# Patient Record
Sex: Female | Born: 1935 | Race: White | Hispanic: No | Smoking: Never smoker
Health system: Southern US, Community
[De-identification: ages and names within clinical notes are randomized; demographics above are authoritative.]

## PROBLEM LIST (undated history)

## (undated) DIAGNOSIS — E785 Hyperlipidemia, unspecified: Secondary | ICD-10-CM

## (undated) DIAGNOSIS — M1990 Unspecified osteoarthritis, unspecified site: Secondary | ICD-10-CM

## (undated) DIAGNOSIS — K5790 Diverticulosis of intestine, part unspecified, without perforation or abscess without bleeding: Secondary | ICD-10-CM

## (undated) DIAGNOSIS — I739 Peripheral vascular disease, unspecified: Secondary | ICD-10-CM

## (undated) DIAGNOSIS — R011 Cardiac murmur, unspecified: Secondary | ICD-10-CM

## (undated) DIAGNOSIS — K449 Diaphragmatic hernia without obstruction or gangrene: Secondary | ICD-10-CM

## (undated) DIAGNOSIS — J069 Acute upper respiratory infection, unspecified: Secondary | ICD-10-CM

## (undated) DIAGNOSIS — I499 Cardiac arrhythmia, unspecified: Secondary | ICD-10-CM

## (undated) DIAGNOSIS — K589 Irritable bowel syndrome without diarrhea: Secondary | ICD-10-CM

## (undated) DIAGNOSIS — F419 Anxiety disorder, unspecified: Secondary | ICD-10-CM

## (undated) DIAGNOSIS — J329 Chronic sinusitis, unspecified: Secondary | ICD-10-CM

## (undated) DIAGNOSIS — J189 Pneumonia, unspecified organism: Secondary | ICD-10-CM

## (undated) DIAGNOSIS — J309 Allergic rhinitis, unspecified: Secondary | ICD-10-CM

## (undated) DIAGNOSIS — K219 Gastro-esophageal reflux disease without esophagitis: Secondary | ICD-10-CM

## (undated) DIAGNOSIS — I10 Essential (primary) hypertension: Secondary | ICD-10-CM

## (undated) HISTORY — DX: Diaphragmatic hernia without obstruction or gangrene: K44.9

## (undated) HISTORY — DX: Diverticulosis of intestine, part unspecified, without perforation or abscess without bleeding: K57.90

## (undated) HISTORY — DX: Irritable bowel syndrome, unspecified: K58.9

## (undated) HISTORY — DX: Anxiety disorder, unspecified: F41.9

## (undated) HISTORY — DX: Peripheral vascular disease, unspecified: I73.9

## (undated) HISTORY — DX: Chronic sinusitis, unspecified: J32.9

## (undated) HISTORY — PX: APPENDECTOMY: SHX54

## (undated) HISTORY — DX: Allergic rhinitis, unspecified: J30.9

## (undated) HISTORY — PX: OOPHORECTOMY: SHX86

## (undated) HISTORY — DX: Cardiac arrhythmia, unspecified: I49.9

## (undated) HISTORY — DX: Hyperlipidemia, unspecified: E78.5

## (undated) HISTORY — DX: Cardiac murmur, unspecified: R01.1

## (undated) HISTORY — DX: Gastro-esophageal reflux disease without esophagitis: K21.9

## (undated) HISTORY — DX: Essential (primary) hypertension: I10

## (undated) HISTORY — DX: Unspecified osteoarthritis, unspecified site: M19.90

## (undated) HISTORY — PX: KNEE SURGERY: SHX244

## (undated) HISTORY — DX: Acute upper respiratory infection, unspecified: J06.9

## (undated) HISTORY — PX: CHOLECYSTECTOMY: SHX55

## (undated) HISTORY — DX: Pneumonia, unspecified organism: J18.9

---

## 1999-02-27 ENCOUNTER — Other Ambulatory Visit: Admission: RE | Admit: 1999-02-27 | Discharge: 1999-02-27 | Payer: Self-pay | Admitting: Internal Medicine

## 2000-05-17 ENCOUNTER — Ambulatory Visit (HOSPITAL_COMMUNITY): Admission: RE | Admit: 2000-05-17 | Discharge: 2000-05-17 | Payer: Self-pay | Admitting: Orthopedic Surgery

## 2001-03-30 ENCOUNTER — Other Ambulatory Visit: Admission: RE | Admit: 2001-03-30 | Discharge: 2001-03-30 | Payer: Self-pay | Admitting: Internal Medicine

## 2001-04-11 ENCOUNTER — Encounter: Payer: Self-pay | Admitting: Internal Medicine

## 2001-04-11 ENCOUNTER — Encounter: Admission: RE | Admit: 2001-04-11 | Discharge: 2001-04-11 | Payer: Self-pay | Admitting: Internal Medicine

## 2001-06-15 ENCOUNTER — Encounter: Admission: RE | Admit: 2001-06-15 | Discharge: 2001-06-15 | Payer: Self-pay | Admitting: Obstetrics and Gynecology

## 2001-06-15 ENCOUNTER — Encounter: Payer: Self-pay | Admitting: Obstetrics and Gynecology

## 2001-07-13 ENCOUNTER — Encounter (INDEPENDENT_AMBULATORY_CARE_PROVIDER_SITE_OTHER): Payer: Self-pay

## 2001-07-13 ENCOUNTER — Ambulatory Visit (HOSPITAL_COMMUNITY): Admission: RE | Admit: 2001-07-13 | Discharge: 2001-07-13 | Payer: Self-pay | Admitting: Obstetrics and Gynecology

## 2002-01-02 ENCOUNTER — Encounter: Payer: Self-pay | Admitting: Emergency Medicine

## 2002-01-02 ENCOUNTER — Emergency Department (HOSPITAL_COMMUNITY): Admission: EM | Admit: 2002-01-02 | Discharge: 2002-01-02 | Payer: Self-pay | Admitting: Emergency Medicine

## 2002-04-05 ENCOUNTER — Observation Stay (HOSPITAL_COMMUNITY): Admission: RE | Admit: 2002-04-05 | Discharge: 2002-04-06 | Payer: Self-pay | Admitting: General Surgery

## 2002-04-05 ENCOUNTER — Encounter (INDEPENDENT_AMBULATORY_CARE_PROVIDER_SITE_OTHER): Payer: Self-pay | Admitting: Specialist

## 2004-09-24 ENCOUNTER — Ambulatory Visit: Payer: Self-pay | Admitting: Internal Medicine

## 2004-09-30 ENCOUNTER — Ambulatory Visit: Payer: Self-pay | Admitting: Internal Medicine

## 2004-10-07 ENCOUNTER — Ambulatory Visit: Payer: Self-pay | Admitting: Internal Medicine

## 2004-10-14 ENCOUNTER — Ambulatory Visit: Payer: Self-pay | Admitting: Internal Medicine

## 2004-10-24 ENCOUNTER — Ambulatory Visit: Payer: Self-pay | Admitting: Internal Medicine

## 2004-11-19 ENCOUNTER — Ambulatory Visit: Payer: Self-pay | Admitting: Internal Medicine

## 2004-11-27 ENCOUNTER — Ambulatory Visit: Payer: Self-pay | Admitting: Internal Medicine

## 2004-12-01 ENCOUNTER — Ambulatory Visit: Payer: Self-pay | Admitting: Internal Medicine

## 2004-12-11 ENCOUNTER — Ambulatory Visit: Payer: Self-pay | Admitting: Internal Medicine

## 2004-12-17 ENCOUNTER — Ambulatory Visit: Payer: Self-pay | Admitting: Internal Medicine

## 2004-12-25 ENCOUNTER — Ambulatory Visit: Payer: Self-pay | Admitting: Internal Medicine

## 2005-01-01 ENCOUNTER — Ambulatory Visit: Payer: Self-pay | Admitting: Internal Medicine

## 2005-01-08 ENCOUNTER — Ambulatory Visit: Payer: Self-pay | Admitting: Internal Medicine

## 2005-01-15 ENCOUNTER — Ambulatory Visit: Payer: Self-pay | Admitting: Internal Medicine

## 2005-01-22 ENCOUNTER — Ambulatory Visit: Payer: Self-pay | Admitting: Internal Medicine

## 2005-01-28 ENCOUNTER — Ambulatory Visit: Payer: Self-pay | Admitting: Internal Medicine

## 2005-02-05 ENCOUNTER — Ambulatory Visit: Payer: Self-pay | Admitting: Internal Medicine

## 2005-02-11 ENCOUNTER — Ambulatory Visit: Payer: Self-pay | Admitting: Internal Medicine

## 2005-02-19 ENCOUNTER — Ambulatory Visit: Payer: Self-pay | Admitting: Internal Medicine

## 2005-02-26 ENCOUNTER — Ambulatory Visit: Payer: Self-pay | Admitting: Internal Medicine

## 2005-03-05 ENCOUNTER — Ambulatory Visit: Payer: Self-pay | Admitting: Internal Medicine

## 2005-03-06 ENCOUNTER — Other Ambulatory Visit: Admission: RE | Admit: 2005-03-06 | Discharge: 2005-03-06 | Payer: Self-pay | Admitting: Obstetrics and Gynecology

## 2005-03-11 ENCOUNTER — Ambulatory Visit: Payer: Self-pay | Admitting: Internal Medicine

## 2005-03-16 ENCOUNTER — Encounter: Admission: RE | Admit: 2005-03-16 | Discharge: 2005-03-16 | Payer: Self-pay | Admitting: Obstetrics and Gynecology

## 2005-03-19 ENCOUNTER — Ambulatory Visit: Payer: Self-pay | Admitting: Internal Medicine

## 2005-03-25 ENCOUNTER — Ambulatory Visit: Payer: Self-pay | Admitting: Internal Medicine

## 2005-04-02 ENCOUNTER — Ambulatory Visit: Payer: Self-pay | Admitting: Internal Medicine

## 2005-04-08 ENCOUNTER — Ambulatory Visit: Payer: Self-pay | Admitting: Internal Medicine

## 2005-04-22 ENCOUNTER — Ambulatory Visit: Payer: Self-pay | Admitting: Internal Medicine

## 2005-04-30 ENCOUNTER — Ambulatory Visit: Payer: Self-pay | Admitting: Internal Medicine

## 2005-05-06 ENCOUNTER — Ambulatory Visit: Payer: Self-pay | Admitting: Internal Medicine

## 2005-05-20 ENCOUNTER — Ambulatory Visit: Payer: Self-pay | Admitting: Internal Medicine

## 2005-05-27 ENCOUNTER — Ambulatory Visit: Payer: Self-pay | Admitting: Internal Medicine

## 2005-05-29 ENCOUNTER — Ambulatory Visit: Payer: Self-pay | Admitting: Internal Medicine

## 2005-06-08 ENCOUNTER — Ambulatory Visit: Payer: Self-pay | Admitting: Internal Medicine

## 2005-06-18 ENCOUNTER — Ambulatory Visit: Payer: Self-pay | Admitting: Internal Medicine

## 2005-06-25 ENCOUNTER — Ambulatory Visit: Payer: Self-pay | Admitting: Internal Medicine

## 2005-07-01 ENCOUNTER — Ambulatory Visit: Payer: Self-pay | Admitting: Internal Medicine

## 2005-07-09 ENCOUNTER — Ambulatory Visit: Payer: Self-pay | Admitting: Internal Medicine

## 2005-07-16 ENCOUNTER — Ambulatory Visit: Payer: Self-pay | Admitting: Internal Medicine

## 2005-07-22 ENCOUNTER — Ambulatory Visit: Payer: Self-pay | Admitting: Internal Medicine

## 2005-08-05 ENCOUNTER — Ambulatory Visit: Payer: Self-pay | Admitting: Internal Medicine

## 2005-08-20 ENCOUNTER — Ambulatory Visit: Payer: Self-pay | Admitting: Internal Medicine

## 2005-08-26 ENCOUNTER — Ambulatory Visit: Payer: Self-pay | Admitting: Internal Medicine

## 2005-09-04 ENCOUNTER — Ambulatory Visit: Payer: Self-pay | Admitting: Internal Medicine

## 2005-09-10 ENCOUNTER — Ambulatory Visit: Payer: Self-pay | Admitting: Internal Medicine

## 2005-09-17 ENCOUNTER — Ambulatory Visit: Payer: Self-pay | Admitting: Internal Medicine

## 2005-09-24 ENCOUNTER — Ambulatory Visit: Payer: Self-pay | Admitting: Internal Medicine

## 2005-10-09 ENCOUNTER — Ambulatory Visit: Payer: Self-pay | Admitting: Internal Medicine

## 2005-10-29 ENCOUNTER — Ambulatory Visit: Payer: Self-pay | Admitting: Internal Medicine

## 2005-11-11 ENCOUNTER — Ambulatory Visit: Payer: Self-pay | Admitting: Internal Medicine

## 2005-11-16 ENCOUNTER — Ambulatory Visit: Payer: Self-pay | Admitting: Internal Medicine

## 2005-11-18 ENCOUNTER — Ambulatory Visit: Payer: Self-pay | Admitting: Internal Medicine

## 2005-11-26 ENCOUNTER — Ambulatory Visit: Payer: Self-pay | Admitting: Internal Medicine

## 2005-12-02 ENCOUNTER — Ambulatory Visit: Payer: Self-pay | Admitting: Internal Medicine

## 2005-12-10 ENCOUNTER — Ambulatory Visit: Payer: Self-pay | Admitting: Internal Medicine

## 2005-12-16 ENCOUNTER — Ambulatory Visit: Payer: Self-pay | Admitting: Internal Medicine

## 2005-12-24 ENCOUNTER — Ambulatory Visit: Payer: Self-pay | Admitting: Internal Medicine

## 2005-12-31 ENCOUNTER — Ambulatory Visit: Payer: Self-pay | Admitting: Internal Medicine

## 2006-01-06 ENCOUNTER — Ambulatory Visit: Payer: Self-pay | Admitting: Internal Medicine

## 2006-01-21 ENCOUNTER — Ambulatory Visit: Payer: Self-pay | Admitting: Internal Medicine

## 2006-01-28 ENCOUNTER — Ambulatory Visit: Payer: Self-pay | Admitting: Internal Medicine

## 2006-02-04 ENCOUNTER — Ambulatory Visit: Payer: Self-pay | Admitting: Internal Medicine

## 2006-02-11 ENCOUNTER — Ambulatory Visit: Payer: Self-pay | Admitting: Internal Medicine

## 2006-02-18 ENCOUNTER — Ambulatory Visit: Payer: Self-pay | Admitting: Internal Medicine

## 2006-02-24 ENCOUNTER — Ambulatory Visit: Payer: Self-pay | Admitting: Internal Medicine

## 2006-03-04 ENCOUNTER — Ambulatory Visit: Payer: Self-pay | Admitting: Internal Medicine

## 2006-03-11 ENCOUNTER — Ambulatory Visit: Payer: Self-pay | Admitting: Internal Medicine

## 2006-03-12 ENCOUNTER — Ambulatory Visit: Payer: Self-pay | Admitting: Internal Medicine

## 2006-03-26 ENCOUNTER — Ambulatory Visit: Payer: Self-pay | Admitting: Internal Medicine

## 2006-04-01 ENCOUNTER — Ambulatory Visit: Payer: Self-pay | Admitting: Internal Medicine

## 2006-04-08 ENCOUNTER — Ambulatory Visit: Payer: Self-pay | Admitting: Internal Medicine

## 2006-04-13 ENCOUNTER — Encounter: Admission: RE | Admit: 2006-04-13 | Discharge: 2006-04-13 | Payer: Self-pay | Admitting: Obstetrics and Gynecology

## 2006-04-22 ENCOUNTER — Ambulatory Visit: Payer: Self-pay | Admitting: Internal Medicine

## 2006-05-11 ENCOUNTER — Ambulatory Visit: Payer: Self-pay | Admitting: Internal Medicine

## 2006-06-03 ENCOUNTER — Ambulatory Visit: Payer: Self-pay | Admitting: Internal Medicine

## 2006-06-11 ENCOUNTER — Ambulatory Visit: Payer: Self-pay | Admitting: Internal Medicine

## 2006-06-17 ENCOUNTER — Ambulatory Visit: Payer: Self-pay | Admitting: Internal Medicine

## 2006-06-24 ENCOUNTER — Ambulatory Visit: Payer: Self-pay | Admitting: Internal Medicine

## 2006-07-01 ENCOUNTER — Ambulatory Visit: Payer: Self-pay | Admitting: Internal Medicine

## 2006-07-08 ENCOUNTER — Ambulatory Visit: Payer: Self-pay | Admitting: Internal Medicine

## 2006-07-22 ENCOUNTER — Ambulatory Visit: Payer: Self-pay | Admitting: Internal Medicine

## 2006-07-29 ENCOUNTER — Ambulatory Visit: Payer: Self-pay | Admitting: Internal Medicine

## 2006-08-05 ENCOUNTER — Ambulatory Visit: Payer: Self-pay | Admitting: Internal Medicine

## 2006-08-19 ENCOUNTER — Ambulatory Visit: Payer: Self-pay | Admitting: Internal Medicine

## 2006-09-02 ENCOUNTER — Ambulatory Visit: Payer: Self-pay | Admitting: Internal Medicine

## 2006-09-09 ENCOUNTER — Ambulatory Visit: Payer: Self-pay | Admitting: Internal Medicine

## 2006-09-13 ENCOUNTER — Ambulatory Visit: Payer: Self-pay | Admitting: Internal Medicine

## 2006-09-16 ENCOUNTER — Ambulatory Visit: Payer: Self-pay | Admitting: Internal Medicine

## 2006-09-23 ENCOUNTER — Ambulatory Visit: Payer: Self-pay | Admitting: Internal Medicine

## 2006-10-08 ENCOUNTER — Ambulatory Visit: Payer: Self-pay | Admitting: Internal Medicine

## 2006-10-13 ENCOUNTER — Ambulatory Visit: Payer: Self-pay | Admitting: Internal Medicine

## 2006-10-21 ENCOUNTER — Ambulatory Visit: Payer: Self-pay | Admitting: Internal Medicine

## 2006-10-28 ENCOUNTER — Ambulatory Visit: Payer: Self-pay | Admitting: *Deleted

## 2006-11-01 ENCOUNTER — Ambulatory Visit: Payer: Self-pay | Admitting: Cardiology

## 2006-11-03 ENCOUNTER — Ambulatory Visit: Payer: Self-pay | Admitting: Internal Medicine

## 2006-11-09 ENCOUNTER — Ambulatory Visit: Payer: Self-pay

## 2006-11-09 ENCOUNTER — Encounter: Payer: Self-pay | Admitting: Cardiology

## 2006-11-10 ENCOUNTER — Ambulatory Visit: Payer: Self-pay | Admitting: Internal Medicine

## 2006-11-11 ENCOUNTER — Ambulatory Visit: Payer: Self-pay | Admitting: Cardiology

## 2006-11-18 ENCOUNTER — Ambulatory Visit: Payer: Self-pay | Admitting: Internal Medicine

## 2006-11-25 ENCOUNTER — Ambulatory Visit: Payer: Self-pay | Admitting: Internal Medicine

## 2006-12-02 ENCOUNTER — Ambulatory Visit: Payer: Self-pay | Admitting: Internal Medicine

## 2006-12-02 ENCOUNTER — Ambulatory Visit: Payer: Self-pay | Admitting: *Deleted

## 2006-12-08 ENCOUNTER — Ambulatory Visit: Payer: Self-pay | Admitting: Internal Medicine

## 2006-12-16 ENCOUNTER — Ambulatory Visit: Payer: Self-pay | Admitting: Internal Medicine

## 2006-12-22 ENCOUNTER — Ambulatory Visit: Payer: Self-pay | Admitting: Internal Medicine

## 2006-12-30 ENCOUNTER — Ambulatory Visit: Payer: Self-pay | Admitting: Internal Medicine

## 2007-01-06 ENCOUNTER — Ambulatory Visit: Payer: Self-pay | Admitting: Internal Medicine

## 2007-01-20 ENCOUNTER — Ambulatory Visit: Payer: Self-pay | Admitting: Internal Medicine

## 2007-01-27 ENCOUNTER — Ambulatory Visit: Payer: Self-pay | Admitting: Internal Medicine

## 2007-02-02 ENCOUNTER — Ambulatory Visit: Payer: Self-pay | Admitting: Internal Medicine

## 2007-02-24 ENCOUNTER — Ambulatory Visit: Payer: Self-pay | Admitting: Internal Medicine

## 2007-03-03 ENCOUNTER — Ambulatory Visit: Payer: Self-pay | Admitting: Internal Medicine

## 2007-03-10 ENCOUNTER — Ambulatory Visit: Payer: Self-pay | Admitting: Internal Medicine

## 2007-03-17 ENCOUNTER — Ambulatory Visit: Payer: Self-pay | Admitting: Internal Medicine

## 2007-03-24 ENCOUNTER — Ambulatory Visit: Payer: Self-pay | Admitting: Internal Medicine

## 2007-03-29 ENCOUNTER — Other Ambulatory Visit: Admission: RE | Admit: 2007-03-29 | Discharge: 2007-03-29 | Payer: Self-pay | Admitting: Obstetrics and Gynecology

## 2007-03-31 ENCOUNTER — Ambulatory Visit: Payer: Self-pay | Admitting: Internal Medicine

## 2007-04-21 ENCOUNTER — Ambulatory Visit: Payer: Self-pay | Admitting: Internal Medicine

## 2007-04-22 ENCOUNTER — Encounter: Admission: RE | Admit: 2007-04-22 | Discharge: 2007-04-22 | Payer: Self-pay | Admitting: Internal Medicine

## 2007-04-28 ENCOUNTER — Ambulatory Visit: Payer: Self-pay | Admitting: Internal Medicine

## 2007-05-05 ENCOUNTER — Ambulatory Visit: Payer: Self-pay | Admitting: Internal Medicine

## 2007-05-12 ENCOUNTER — Ambulatory Visit: Payer: Self-pay | Admitting: Internal Medicine

## 2007-05-20 ENCOUNTER — Ambulatory Visit: Payer: Self-pay | Admitting: Internal Medicine

## 2007-05-26 ENCOUNTER — Ambulatory Visit: Payer: Self-pay | Admitting: Internal Medicine

## 2007-06-02 ENCOUNTER — Ambulatory Visit: Payer: Self-pay | Admitting: Internal Medicine

## 2007-06-03 ENCOUNTER — Ambulatory Visit: Payer: Self-pay | Admitting: Internal Medicine

## 2007-06-09 ENCOUNTER — Ambulatory Visit: Payer: Self-pay | Admitting: Internal Medicine

## 2007-06-15 ENCOUNTER — Ambulatory Visit: Payer: Self-pay | Admitting: Internal Medicine

## 2007-06-23 ENCOUNTER — Ambulatory Visit: Payer: Self-pay | Admitting: Internal Medicine

## 2007-06-24 ENCOUNTER — Ambulatory Visit: Payer: Self-pay | Admitting: Internal Medicine

## 2007-06-29 ENCOUNTER — Ambulatory Visit: Payer: Self-pay | Admitting: Internal Medicine

## 2007-07-07 ENCOUNTER — Ambulatory Visit: Payer: Self-pay | Admitting: Internal Medicine

## 2007-07-14 ENCOUNTER — Ambulatory Visit: Payer: Self-pay | Admitting: Internal Medicine

## 2007-07-28 ENCOUNTER — Ambulatory Visit: Payer: Self-pay | Admitting: Internal Medicine

## 2007-08-11 ENCOUNTER — Ambulatory Visit: Payer: Self-pay | Admitting: Internal Medicine

## 2007-08-25 ENCOUNTER — Ambulatory Visit: Payer: Self-pay | Admitting: Internal Medicine

## 2007-09-02 ENCOUNTER — Ambulatory Visit: Payer: Self-pay | Admitting: Internal Medicine

## 2007-09-08 ENCOUNTER — Ambulatory Visit: Payer: Self-pay | Admitting: Internal Medicine

## 2007-09-16 ENCOUNTER — Ambulatory Visit: Payer: Self-pay | Admitting: Internal Medicine

## 2007-09-22 DIAGNOSIS — J189 Pneumonia, unspecified organism: Secondary | ICD-10-CM

## 2007-09-22 HISTORY — DX: Pneumonia, unspecified organism: J18.9

## 2007-09-30 ENCOUNTER — Ambulatory Visit: Payer: Self-pay | Admitting: Internal Medicine

## 2007-10-13 ENCOUNTER — Ambulatory Visit: Payer: Self-pay | Admitting: Internal Medicine

## 2007-10-27 ENCOUNTER — Ambulatory Visit: Payer: Self-pay | Admitting: Internal Medicine

## 2007-11-02 ENCOUNTER — Ambulatory Visit: Payer: Self-pay | Admitting: Internal Medicine

## 2007-11-10 ENCOUNTER — Ambulatory Visit: Payer: Self-pay | Admitting: Internal Medicine

## 2007-11-17 ENCOUNTER — Ambulatory Visit: Payer: Self-pay | Admitting: Internal Medicine

## 2007-11-22 ENCOUNTER — Ambulatory Visit: Payer: Self-pay | Admitting: Internal Medicine

## 2007-12-01 ENCOUNTER — Ambulatory Visit: Payer: Self-pay | Admitting: Internal Medicine

## 2007-12-08 ENCOUNTER — Ambulatory Visit: Payer: Self-pay | Admitting: Internal Medicine

## 2007-12-09 ENCOUNTER — Ambulatory Visit: Payer: Self-pay | Admitting: Internal Medicine

## 2007-12-15 ENCOUNTER — Ambulatory Visit: Payer: Self-pay | Admitting: Internal Medicine

## 2007-12-22 ENCOUNTER — Ambulatory Visit: Payer: Self-pay | Admitting: Internal Medicine

## 2007-12-29 ENCOUNTER — Ambulatory Visit: Payer: Self-pay | Admitting: Internal Medicine

## 2008-01-05 ENCOUNTER — Ambulatory Visit: Payer: Self-pay | Admitting: Internal Medicine

## 2008-01-11 ENCOUNTER — Ambulatory Visit: Payer: Self-pay | Admitting: Internal Medicine

## 2008-01-17 ENCOUNTER — Ambulatory Visit: Payer: Self-pay | Admitting: Internal Medicine

## 2008-01-17 DIAGNOSIS — J069 Acute upper respiratory infection, unspecified: Secondary | ICD-10-CM | POA: Insufficient documentation

## 2008-01-17 DIAGNOSIS — J309 Allergic rhinitis, unspecified: Secondary | ICD-10-CM | POA: Insufficient documentation

## 2008-01-19 ENCOUNTER — Ambulatory Visit: Payer: Self-pay | Admitting: Internal Medicine

## 2008-01-26 ENCOUNTER — Ambulatory Visit: Payer: Self-pay | Admitting: Internal Medicine

## 2008-02-02 ENCOUNTER — Ambulatory Visit: Payer: Self-pay | Admitting: Internal Medicine

## 2008-02-09 ENCOUNTER — Ambulatory Visit: Payer: Self-pay | Admitting: Internal Medicine

## 2008-02-16 ENCOUNTER — Ambulatory Visit: Payer: Self-pay | Admitting: Internal Medicine

## 2008-02-23 ENCOUNTER — Ambulatory Visit: Payer: Self-pay | Admitting: Internal Medicine

## 2008-03-01 ENCOUNTER — Ambulatory Visit: Payer: Self-pay | Admitting: Internal Medicine

## 2008-03-15 ENCOUNTER — Ambulatory Visit: Payer: Self-pay | Admitting: Internal Medicine

## 2008-03-22 ENCOUNTER — Ambulatory Visit: Payer: Self-pay | Admitting: Internal Medicine

## 2008-03-29 ENCOUNTER — Ambulatory Visit: Payer: Self-pay | Admitting: Internal Medicine

## 2008-04-12 ENCOUNTER — Ambulatory Visit: Payer: Self-pay | Admitting: Internal Medicine

## 2008-04-13 ENCOUNTER — Ambulatory Visit: Payer: Self-pay | Admitting: Internal Medicine

## 2008-04-19 ENCOUNTER — Ambulatory Visit: Payer: Self-pay | Admitting: Internal Medicine

## 2008-04-24 ENCOUNTER — Encounter: Admission: RE | Admit: 2008-04-24 | Discharge: 2008-04-24 | Payer: Self-pay | Admitting: Obstetrics and Gynecology

## 2008-05-02 ENCOUNTER — Ambulatory Visit: Payer: Self-pay | Admitting: Internal Medicine

## 2008-05-07 ENCOUNTER — Ambulatory Visit: Payer: Self-pay | Admitting: Internal Medicine

## 2008-05-09 ENCOUNTER — Ambulatory Visit (HOSPITAL_COMMUNITY): Admission: RE | Admit: 2008-05-09 | Discharge: 2008-05-09 | Payer: Self-pay | Admitting: Internal Medicine

## 2008-05-17 ENCOUNTER — Ambulatory Visit: Payer: Self-pay | Admitting: Internal Medicine

## 2008-05-24 ENCOUNTER — Ambulatory Visit: Payer: Self-pay | Admitting: Internal Medicine

## 2008-05-30 ENCOUNTER — Ambulatory Visit: Payer: Self-pay | Admitting: Internal Medicine

## 2008-06-06 ENCOUNTER — Ambulatory Visit: Payer: Self-pay | Admitting: Internal Medicine

## 2008-06-14 ENCOUNTER — Ambulatory Visit: Payer: Self-pay | Admitting: Internal Medicine

## 2008-06-21 ENCOUNTER — Ambulatory Visit: Payer: Self-pay | Admitting: Internal Medicine

## 2008-07-11 ENCOUNTER — Ambulatory Visit: Payer: Self-pay | Admitting: Internal Medicine

## 2008-07-19 ENCOUNTER — Ambulatory Visit: Payer: Self-pay | Admitting: Internal Medicine

## 2008-07-26 ENCOUNTER — Ambulatory Visit: Payer: Self-pay | Admitting: Internal Medicine

## 2008-08-14 ENCOUNTER — Ambulatory Visit: Payer: Self-pay | Admitting: Internal Medicine

## 2008-08-15 ENCOUNTER — Ambulatory Visit: Payer: Self-pay | Admitting: Cardiology

## 2008-08-23 ENCOUNTER — Ambulatory Visit: Payer: Self-pay | Admitting: Internal Medicine

## 2008-09-06 ENCOUNTER — Ambulatory Visit: Payer: Self-pay | Admitting: Internal Medicine

## 2008-09-12 ENCOUNTER — Ambulatory Visit: Payer: Self-pay | Admitting: Internal Medicine

## 2008-09-13 ENCOUNTER — Ambulatory Visit: Payer: Self-pay | Admitting: Internal Medicine

## 2008-09-27 ENCOUNTER — Ambulatory Visit: Payer: Self-pay | Admitting: Internal Medicine

## 2008-10-04 ENCOUNTER — Ambulatory Visit: Payer: Self-pay | Admitting: Internal Medicine

## 2008-10-11 ENCOUNTER — Ambulatory Visit: Payer: Self-pay | Admitting: Internal Medicine

## 2008-10-18 ENCOUNTER — Ambulatory Visit: Payer: Self-pay | Admitting: Internal Medicine

## 2008-11-01 ENCOUNTER — Ambulatory Visit: Payer: Self-pay | Admitting: Internal Medicine

## 2008-11-06 ENCOUNTER — Ambulatory Visit: Payer: Self-pay | Admitting: Internal Medicine

## 2008-11-06 ENCOUNTER — Encounter: Payer: Self-pay | Admitting: Internal Medicine

## 2008-11-06 DIAGNOSIS — I10 Essential (primary) hypertension: Secondary | ICD-10-CM | POA: Insufficient documentation

## 2008-11-06 DIAGNOSIS — E785 Hyperlipidemia, unspecified: Secondary | ICD-10-CM | POA: Insufficient documentation

## 2008-11-15 ENCOUNTER — Ambulatory Visit: Payer: Self-pay | Admitting: Internal Medicine

## 2008-12-12 ENCOUNTER — Ambulatory Visit: Payer: Self-pay | Admitting: Internal Medicine

## 2008-12-20 ENCOUNTER — Ambulatory Visit: Payer: Self-pay | Admitting: Internal Medicine

## 2009-01-04 ENCOUNTER — Ambulatory Visit: Payer: Self-pay | Admitting: Internal Medicine

## 2009-01-11 ENCOUNTER — Ambulatory Visit: Payer: Self-pay | Admitting: Internal Medicine

## 2009-01-24 ENCOUNTER — Ambulatory Visit: Payer: Self-pay | Admitting: Internal Medicine

## 2009-01-30 ENCOUNTER — Ambulatory Visit: Payer: Self-pay | Admitting: Internal Medicine

## 2009-02-14 ENCOUNTER — Ambulatory Visit: Payer: Self-pay | Admitting: Internal Medicine

## 2009-02-21 ENCOUNTER — Ambulatory Visit: Payer: Self-pay | Admitting: Internal Medicine

## 2009-02-22 ENCOUNTER — Ambulatory Visit: Payer: Self-pay | Admitting: Internal Medicine

## 2009-03-07 ENCOUNTER — Ambulatory Visit: Payer: Self-pay | Admitting: Internal Medicine

## 2009-03-14 ENCOUNTER — Ambulatory Visit: Payer: Self-pay | Admitting: Internal Medicine

## 2009-03-20 ENCOUNTER — Ambulatory Visit: Payer: Self-pay | Admitting: Internal Medicine

## 2009-03-28 ENCOUNTER — Ambulatory Visit: Payer: Self-pay | Admitting: Internal Medicine

## 2009-04-04 ENCOUNTER — Ambulatory Visit: Payer: Self-pay | Admitting: Internal Medicine

## 2009-04-11 ENCOUNTER — Ambulatory Visit: Payer: Self-pay | Admitting: Internal Medicine

## 2009-04-17 ENCOUNTER — Other Ambulatory Visit: Admission: RE | Admit: 2009-04-17 | Discharge: 2009-04-17 | Payer: Self-pay | Admitting: Obstetrics and Gynecology

## 2009-04-18 ENCOUNTER — Ambulatory Visit: Payer: Self-pay | Admitting: Internal Medicine

## 2009-04-19 ENCOUNTER — Ambulatory Visit: Payer: Self-pay | Admitting: Cardiology

## 2009-04-19 DIAGNOSIS — I4949 Other premature depolarization: Secondary | ICD-10-CM | POA: Insufficient documentation

## 2009-04-25 ENCOUNTER — Ambulatory Visit: Payer: Self-pay | Admitting: Internal Medicine

## 2009-04-30 ENCOUNTER — Encounter: Admission: RE | Admit: 2009-04-30 | Discharge: 2009-04-30 | Payer: Self-pay | Admitting: Internal Medicine

## 2009-05-06 ENCOUNTER — Encounter: Admission: RE | Admit: 2009-05-06 | Discharge: 2009-05-06 | Payer: Self-pay | Admitting: Internal Medicine

## 2009-05-09 ENCOUNTER — Ambulatory Visit: Payer: Self-pay | Admitting: Internal Medicine

## 2009-05-16 ENCOUNTER — Ambulatory Visit: Payer: Self-pay | Admitting: Internal Medicine

## 2009-05-29 ENCOUNTER — Ambulatory Visit: Payer: Self-pay | Admitting: Internal Medicine

## 2009-06-05 ENCOUNTER — Ambulatory Visit: Payer: Self-pay | Admitting: Internal Medicine

## 2009-06-13 ENCOUNTER — Ambulatory Visit: Payer: Self-pay | Admitting: Internal Medicine

## 2009-06-18 ENCOUNTER — Ambulatory Visit (HOSPITAL_COMMUNITY): Admission: RE | Admit: 2009-06-18 | Discharge: 2009-06-18 | Payer: Self-pay | Admitting: Internal Medicine

## 2009-06-27 ENCOUNTER — Ambulatory Visit: Payer: Self-pay | Admitting: Internal Medicine

## 2009-07-03 ENCOUNTER — Ambulatory Visit: Payer: Self-pay | Admitting: Internal Medicine

## 2009-07-11 ENCOUNTER — Ambulatory Visit: Payer: Self-pay | Admitting: Internal Medicine

## 2009-07-17 ENCOUNTER — Ambulatory Visit: Payer: Self-pay | Admitting: Internal Medicine

## 2009-07-31 ENCOUNTER — Ambulatory Visit: Payer: Self-pay | Admitting: Internal Medicine

## 2009-08-08 ENCOUNTER — Ambulatory Visit: Payer: Self-pay | Admitting: Internal Medicine

## 2009-08-29 ENCOUNTER — Ambulatory Visit: Payer: Self-pay | Admitting: Internal Medicine

## 2009-09-06 ENCOUNTER — Ambulatory Visit: Payer: Self-pay | Admitting: Internal Medicine

## 2009-09-18 ENCOUNTER — Ambulatory Visit: Payer: Self-pay | Admitting: Internal Medicine

## 2009-09-26 ENCOUNTER — Ambulatory Visit: Payer: Self-pay | Admitting: Internal Medicine

## 2009-10-03 ENCOUNTER — Ambulatory Visit: Payer: Self-pay | Admitting: Internal Medicine

## 2009-10-10 ENCOUNTER — Ambulatory Visit: Payer: Self-pay | Admitting: Internal Medicine

## 2009-10-31 ENCOUNTER — Ambulatory Visit: Payer: Self-pay | Admitting: Internal Medicine

## 2009-11-05 ENCOUNTER — Ambulatory Visit: Payer: Self-pay | Admitting: Internal Medicine

## 2009-11-14 ENCOUNTER — Ambulatory Visit: Payer: Self-pay | Admitting: Internal Medicine

## 2009-11-21 ENCOUNTER — Ambulatory Visit: Payer: Self-pay | Admitting: Internal Medicine

## 2009-12-04 ENCOUNTER — Ambulatory Visit: Payer: Self-pay | Admitting: Internal Medicine

## 2009-12-12 ENCOUNTER — Ambulatory Visit: Payer: Self-pay | Admitting: Internal Medicine

## 2009-12-14 ENCOUNTER — Emergency Department (HOSPITAL_COMMUNITY): Admission: EM | Admit: 2009-12-14 | Discharge: 2009-12-14 | Payer: Self-pay | Admitting: Emergency Medicine

## 2009-12-26 ENCOUNTER — Ambulatory Visit: Payer: Self-pay | Admitting: Internal Medicine

## 2010-01-01 ENCOUNTER — Ambulatory Visit: Payer: Self-pay | Admitting: Internal Medicine

## 2010-01-02 ENCOUNTER — Ambulatory Visit: Payer: Self-pay | Admitting: Internal Medicine

## 2010-01-08 ENCOUNTER — Ambulatory Visit: Payer: Self-pay | Admitting: Cardiology

## 2010-01-09 ENCOUNTER — Ambulatory Visit: Payer: Self-pay | Admitting: Internal Medicine

## 2010-01-16 ENCOUNTER — Ambulatory Visit: Payer: Self-pay | Admitting: Internal Medicine

## 2010-01-23 ENCOUNTER — Ambulatory Visit: Payer: Self-pay | Admitting: Internal Medicine

## 2010-01-29 ENCOUNTER — Ambulatory Visit: Payer: Self-pay | Admitting: Internal Medicine

## 2010-02-12 ENCOUNTER — Ambulatory Visit: Payer: Self-pay | Admitting: Internal Medicine

## 2010-02-20 ENCOUNTER — Ambulatory Visit: Payer: Self-pay | Admitting: Internal Medicine

## 2010-02-27 ENCOUNTER — Ambulatory Visit: Payer: Self-pay | Admitting: Internal Medicine

## 2010-03-06 ENCOUNTER — Ambulatory Visit: Payer: Self-pay | Admitting: Internal Medicine

## 2010-03-13 ENCOUNTER — Ambulatory Visit: Payer: Self-pay | Admitting: Internal Medicine

## 2010-03-19 ENCOUNTER — Ambulatory Visit: Payer: Self-pay | Admitting: Internal Medicine

## 2010-03-27 ENCOUNTER — Ambulatory Visit: Payer: Self-pay | Admitting: Internal Medicine

## 2010-04-03 ENCOUNTER — Ambulatory Visit: Payer: Self-pay | Admitting: Internal Medicine

## 2010-04-10 ENCOUNTER — Ambulatory Visit: Payer: Self-pay | Admitting: Internal Medicine

## 2010-04-17 ENCOUNTER — Ambulatory Visit: Payer: Self-pay | Admitting: Internal Medicine

## 2010-04-24 ENCOUNTER — Ambulatory Visit: Payer: Self-pay | Admitting: Internal Medicine

## 2010-05-01 ENCOUNTER — Ambulatory Visit: Payer: Self-pay | Admitting: Internal Medicine

## 2010-05-07 ENCOUNTER — Ambulatory Visit: Payer: Self-pay | Admitting: Internal Medicine

## 2010-05-13 ENCOUNTER — Encounter: Admission: RE | Admit: 2010-05-13 | Discharge: 2010-05-13 | Payer: Self-pay | Admitting: Obstetrics and Gynecology

## 2010-05-15 ENCOUNTER — Ambulatory Visit: Payer: Self-pay | Admitting: Internal Medicine

## 2010-05-22 ENCOUNTER — Ambulatory Visit: Payer: Self-pay | Admitting: Internal Medicine

## 2010-05-23 ENCOUNTER — Ambulatory Visit: Payer: Self-pay | Admitting: Internal Medicine

## 2010-05-28 ENCOUNTER — Ambulatory Visit: Payer: Self-pay | Admitting: Internal Medicine

## 2010-06-05 ENCOUNTER — Ambulatory Visit: Payer: Self-pay | Admitting: Internal Medicine

## 2010-06-12 ENCOUNTER — Ambulatory Visit: Payer: Self-pay | Admitting: Internal Medicine

## 2010-06-19 ENCOUNTER — Ambulatory Visit: Payer: Self-pay | Admitting: Internal Medicine

## 2010-06-26 ENCOUNTER — Ambulatory Visit: Payer: Self-pay | Admitting: Internal Medicine

## 2010-07-03 ENCOUNTER — Ambulatory Visit: Payer: Self-pay | Admitting: Internal Medicine

## 2010-07-10 ENCOUNTER — Ambulatory Visit: Payer: Self-pay | Admitting: Internal Medicine

## 2010-07-17 ENCOUNTER — Ambulatory Visit: Payer: Self-pay | Admitting: Internal Medicine

## 2010-07-24 ENCOUNTER — Ambulatory Visit: Payer: Self-pay | Admitting: Internal Medicine

## 2010-07-30 ENCOUNTER — Ambulatory Visit: Payer: Self-pay | Admitting: Internal Medicine

## 2010-08-28 ENCOUNTER — Ambulatory Visit: Payer: Self-pay | Admitting: Internal Medicine

## 2010-09-04 ENCOUNTER — Ambulatory Visit: Payer: Self-pay | Admitting: Internal Medicine

## 2010-09-16 ENCOUNTER — Ambulatory Visit: Payer: Self-pay | Admitting: Internal Medicine

## 2010-09-23 ENCOUNTER — Ambulatory Visit: Payer: Self-pay | Admitting: Internal Medicine

## 2010-10-04 ENCOUNTER — Ambulatory Visit: Payer: Self-pay | Admitting: Internal Medicine

## 2010-10-10 ENCOUNTER — Ambulatory Visit: Payer: Self-pay | Admitting: Internal Medicine

## 2010-10-13 ENCOUNTER — Encounter: Payer: Self-pay | Admitting: Internal Medicine

## 2010-10-19 ENCOUNTER — Ambulatory Visit: Payer: Self-pay | Admitting: Internal Medicine

## 2010-10-21 NOTE — Miscellaneous (Signed)
Summary: Injection Record / [facility]    Injection Record / [facility]    Imported By: Lennie Odor 05/23/2010 10:33:08  _____________________________________________________________________  External Attachment:    Type:   Image     Comment:   External Document

## 2010-10-21 NOTE — Miscellaneous (Signed)
Summary: Injection Record / [facility]    Injection Record /  Allergy    Imported By: Lennie Odor 02/11/2010 15:01:49  _____________________________________________________________________  External Attachment:    Type:   Image     Comment:   External Document

## 2010-10-21 NOTE — Assessment & Plan Note (Signed)
Summary: 1 year//mbw   Primary Provider/Referring Provider:  Guerry Bruin  CC:  follow up visit-alleriges "kicking in".  History of Present Illness:  05/07/09- 77 year old woman returning for follow-up of allergic rhinitis.  Reports bronchitis in April and pneumonia, right lung in May.  She is using a Neti pot for saline nasal rinses p.r.n. and she continues allergy vaccine at 1:10, getting injections here.  Today, she feels well.  Dry cough varies from day to day.  Denies wheeze, fever or chills, dyspnea, or chest pain, ankle edema.  11/06/08- Allergic rhinitis Continues allergy vaccine here without problems. Went to an urgent care for treatment of a sinus infection. Takes zyrtec most days for watery nose. Never wheeze or asthma problems.  November 05, 2009- Allergic rhinitis One year f/u. She notices her nose has begun to water in the last day or so. Never wheeze or asthma.  Following with Dr Daleen Squibb for PVCs. She continues to feel allergy vaccine helps. We talked about indications for retesting. Discussed flu season- has had flu vax.    Current Medications (verified): 1)  Allergy Vaccine 1:10 Gh .... As Directed 2)  Atenolol 50 Mg Tabs (Atenolol) .Marland Kitchen.. 1 Tab Two Times A Day 3)  Prilosec Otc 20 Mg  Tbec (Omeprazole Magnesium) .... Take 1 Tablet By Mouth Once A Day 4)  Adult Aspirin Ec Low Strength 81 Mg  Tbec (Aspirin) .... Once Daily 5)  Cilostazol 100 Mg  Tabs (Cilostazol) .... Take 1 Tablet By Mouth Once A Day 6)  Amitriptyline Hcl 25 Mg  Tabs (Amitriptyline Hcl) .... Once Daily 7)  Vitamin D 1000 Unit Tabs (Cholecalciferol) .Marland Kitchen.. 1 Tab Once Daily 8)  Citracal + D 250-200 Mg-Unit  Tabs (Calcium Citrate-Vitamin D) .... Once Daily 9)  Alprazolam 0.5 Mg  Tabs (Alprazolam) .... Once Daily 10)  Pravastatin Sodium 40 Mg  Tabs (Pravastatin Sodium) .Marland Kitchen.. 1 Tab At Bedtime 11)  Zyrtec Allergy 10 Mg  Tabs (Cetirizine Hcl) .Marland Kitchen.. 1 Daily As Needed For Allergy 12)  Chlorthalidone 25 Mg Tabs  (Chlorthalidone) .Marland Kitchen.. 1 Tab Once Daily 13)  Multivitamins   Tabs (Multiple Vitamin) .Marland Kitchen.. 1 Tab Once Daily 14)  Klor-Con 10 10 Meq Cr-Tabs (Potassium Chloride) .... Take 1 By Mouth Once Daily  Allergies (verified): No Known Drug Allergies  Past History:  Past Medical History: Last updated: 04/18/2009 HYPERTENSION (ICD-401.9) HYPERLIPIDEMIA (ICD-272.4) URI (ICD-465.9) ALLERGIC RHINITIS (ICD-477.9) Pneumonia 2009  Past Surgical History: Last updated: 05/07/2008 Knee- Right repair after fall Ophorectomy Appendectomy  Family History: Last updated: 05/07/2008 grandfather had asthma mother died at 49 from CHF father died at 19 from old age 4 siblings living, both healthy  Social History: Last updated: 11/06/2008 never smoked positive for second-hand smoke exposure exercises 3x a week no caffeine 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    Vital Signs:  Patient profile:   77 year old female Height:      59 inches Weight:      127.25 pounds BMI:     25.79 O2 Sat:      99 % on Room air Pulse rate:   43 / minute BP sitting:   116 / 60  (left arm) Cuff size:   regular  Vitals  Entered By: Reynaldo Minium CMA (November 05, 2009 2:51 PM)  O2 Flow:  Room air  Physical Exam  Additional Exam:  General: A/Ox3; pleasant and cooperative, NAD, well-appearing SKIN: no rash, lesions NODES: no lymphadenopathy HEENT: [place]/AT, EOM- WNL, Conjuctivae- clear, PERRLA, TM-WNL, Nose- clear, Throat- clear and wnl, Melampatti III NECK: Supple w/ fair ROM, JVD- none, normal carotid impulses w/o bruits Thyroid-  CHEST: Clear to P&A, no wheeze or  rales HEART: Occasional dropped beat, no m/g/r heard ABDOMEN: Soft and nl;  ZOX:WRUE, nl pulses, no edema  NEURO: Grossly intact to observation      Impression & Recommendations:  Problem # 1:  ALLERGIC RHINITIS (ICD-477.9)  We discussed supplementation of allergy vaccine with antihistamine as needed. Her updated medication list for this problem includes:    Zyrtec Allergy 10 Mg Tabs (Cetirizine hcl) .Marland Kitchen... 1 daily as needed for allergy  Medications Added to Medication List This Visit: 1)  Klor-con 10 10 Meq Cr-tabs (Potassium chloride) .... Take 1 by mouth once daily  Other Orders: Est. Patient Level III (45409)  Patient Instructions: 1)  Schedule return in one year, earlier if needed 2)  Consider trying loratadine/ claritin for comparison with your Zyrtec if an antihistamine is needed for nasal drip and sneeze.

## 2010-10-21 NOTE — Assessment & Plan Note (Signed)
Summary: rov/jss  Medications Added KLOR-CON M20 20 MEQ CR-TABS (POTASSIUM CHLORIDE CRYS CR) 1 once daily      Allergies Added: NKDA  Visit Type:  rov Primary Provider:  Guerry Bruin  CC:  fatigue..denies any other complaints today.  History of Present Illness: Victoria Joseph returns today for evaluation and management of her premature ventricular contractions.  She received developed sinusitis and went to a [facility]. She was told that her heart rate was low and she needed to go to the emergency room. She went to [facility] her EKG showed normal sinus rhythm with frequent unifocal PVCs with a heart rate of 86 beats per minute. Today, her EKG shows her to be in ventricular bigeminy with a heart rate of 81 beats per minute. Checking her radial pulse it was running in the 40s.  She brings in a log of blood pressure reads which are quite good and a goal. Her heart rate sometimes runs in the 40s which is when she's having a lot of PVCs as I reassured her today. She is having no presyncope or palpitations.  Her potassium was 3.5 otherwise all over that it was negative or normal. I reviewed those with her today. She is on 10 mEq of potassium a day.  Current Medications (verified): 1)  Allergy Vaccine 1:10 Gh .... As Directed 2)  Atenolol 50 Mg Tabs (Atenolol) .Marland Kitchen.. 1 Tab Two Times A Day 3)  Prilosec Otc 20 Mg  Tbec (Omeprazole Magnesium) .... Take 1 Tablet By Mouth Once A Day 4)  Adult Aspirin Ec Low Strength 81 Mg  Tbec (Aspirin) .... Once Daily 5)  Cilostazol 100 Mg  Tabs (Cilostazol) .... Take 1 Tablet By Mouth Once A Day 6)  Amitriptyline Hcl 25 Mg  Tabs (Amitriptyline Hcl) .... Once Daily 7)  Vitamin D 1000 Unit Tabs (Cholecalciferol) .Marland Kitchen.. 1 Tab Once Daily 8)  Citracal + D 250-200 Mg-Unit  Tabs (Calcium Citrate-Vitamin D) .... Once Daily 9)  Alprazolam 0.5 Mg  Tabs (Alprazolam) .... Once Daily 10)  Pravastatin Sodium 40 Mg  Tabs (Pravastatin Sodium) .Marland Kitchen.. 1 Tab At Bedtime 11)  Zyrtec  Allergy 10 Mg  Tabs (Cetirizine Hcl) .Marland Kitchen.. 1 Daily As Needed For Allergy 12)  Chlorthalidone 25 Mg Tabs (Chlorthalidone) .Marland Kitchen.. 1 Tab Once Daily 13)  Multivitamins   Tabs (Multiple Vitamin) .Marland Kitchen.. 1 Tab Once Daily 14)  Klor-Con 10 10 Meq Cr-Tabs (Potassium Chloride) .... Take 1 By Mouth Once Daily  Allergies (verified): No Known Drug Allergies  Past History:  Past Medical History: Last updated: 04/18/2009 HYPERTENSION (ICD-401.9) HYPERLIPIDEMIA (ICD-272.4) URI (ICD-465.9) ALLERGIC RHINITIS (ICD-477.9) Pneumonia 2009  Past Surgical History: Last updated: 05/07/2008 Knee- Right repair after fall Ophorectomy Appendectomy  Family History: Last updated: 05/07/2008 grandfather had asthma mother died at 35 from CHF father died at 79 from old age 66 siblings living, both healthy  Social History: Last updated: 11/06/2008 never smoked positive for second-hand smoke exposure exercises 3x a week no caffeine 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 Eyes:  other than wearing glasses is normal Neck:  Neck supple, no JVD. No masses, thyromegaly or abnormal cervical nodes. Chest Victoria Joseph:  no deformities or breast masses noted Lungs:  Clear bilaterally to auscultation and percussion. Heart:  normal PMI, irregular rate and rhythm, normal S1-S2 Abdomen:  Bowel sounds positive; abdomen soft and non-tender without masses, organomegaly, or hernias noted. No hepatosplenomegaly. Msk:  Back  normal, normal gait. Muscle strength and tone normal. Pulses:  pulses normal in all 4 extremities Extremities:  No clubbing or cyanosis. Neurologic:  Alert and oriented x 3. Skin:  Intact without lesions or rashes. Psych:  Normal affect.   EKG  Procedure date:  01/08/2010  Findings:      normal sinus rhythm, ventricular bigeminy, rate 81 beats per minute  Impression & Recommendations:  Problem # 1:  PREMATURE VENTRICULAR CONTRACTIONS (ICD-427.69) Assessment Deteriorated I have reviewed all the records from outside including emergency room. Her affective heart rate when she's having a lot of PVCs is in the mid 40s. She is totally asymptomatic. Advised her that when someone checks her heart rate to check it by listening over her chest to get an accurate treat. Her potassium was 3.5 so we'll increase her potassium to 20 mEq a day.I will not decrease her atenolol for concerned that she'll have more symptomatology. I've advised her to see her primary care physician who is quite good rather than going to urgent care or [facility]. This will save a lot of confusion in the future not to mention redundant care. Her updated medication list for this problem includes:    Atenolol 50 Mg Tabs (Atenolol) .Marland Kitchen... 1 tab two times a day    Adult Aspirin Ec Low Strength 81 Mg Tbec (Aspirin) ..... Once daily    Cilostazol 100 Mg Tabs (Cilostazol) .Marland Kitchen... Take 1 tablet by mouth once a day  Orders: EKG w/ Interpretation (93000)  Problem # 2:  HYPERTENSION (ICD-401.9) Assessment: Improved  Her updated medication list for this problem includes:    Atenolol 50 Mg Tabs (Atenolol) .Marland Kitchen... 1 tab two times a day    Adult Aspirin Ec Low Strength 81 Mg Tbec (Aspirin) ..... Once daily    Chlorthalidone 25 Mg Tabs (Chlorthalidone) .Marland Kitchen... 1 tab once daily  Problem # 3:  HYPERLIPIDEMIA (ICD-272.4)  Her updated medication list for this problem includes:    Pravastatin Sodium 40 Mg Tabs (Pravastatin sodium) .Marland Kitchen... 1 tab  at bedtime  Problem # 5:  PREMATURE VENTRICULAR CONTRACTIONS (ICD-427.69)  Her updated medication list for this problem includes:    Atenolol 50 Mg Tabs (Atenolol) .Marland Kitchen... 1 tab two times a day    Adult Aspirin Ec Low Strength 81 Mg Tbec (Aspirin) ..... Once daily    Cilostazol 100 Mg Tabs (Cilostazol) .Marland Kitchen... Take 1 tablet by mouth once a day  Orders: EKG w/ Interpretation (93000)  Her updated medication list for this problem includes:    Atenolol 50 Mg Tabs (Atenolol) .Marland Kitchen... 1 tab two times a day    Adult Aspirin Ec Low Strength 81 Mg Tbec (Aspirin) ..... Once daily    Cilostazol 100 Mg Tabs (Cilostazol) .Marland Kitchen... Take 1 tablet by mouth once a day  Patient Instructions: 1)  Your physician recommends that you schedule a follow-up appointment in: YEAR WITH DR Marylou Wages 2)  Your physician has recommended you make  the following change in your medication:  3)  INCREASE POTASSIUM TO 20 MEQ EVERY DAY Prescriptions: KLOR-CON M20 20 MEQ CR-TABS (POTASSIUM CHLORIDE CRYS CR) 1 once daily  #30 x 11   Entered by:   Scherrie Bateman, LPN   Authorized by:   Gaylord Shih, MD, [facility]   Signed by:   Scherrie Bateman, LPN on 16/06/9603   Method used:   Electronically to        [id number] (retail)       [address]       [place], [place]  [address]       Ph: [phone] or [phone]       Fax: [phone]   RxID:   [id number]

## 2010-10-21 NOTE — Miscellaneous (Signed)
Summary: Injection Record/[facility]  Injection Record/[facility]   Imported By: Sherian Rein 01/22/2010 14:37:04  _____________________________________________________________________  External Attachment:    Type:   Image     Comment:   External Document

## 2010-10-23 DIAGNOSIS — J301 Allergic rhinitis due to pollen: Secondary | ICD-10-CM

## 2010-10-23 NOTE — Miscellaneous (Signed)
Summary: Injection record  Injection record   Imported By: Lester [place] 10/03/2010 11:50:14  _____________________________________________________________________  External Attachment:    Type:   Image     Comment:   External Document

## 2010-10-24 DIAGNOSIS — J301 Allergic rhinitis due to pollen: Secondary | ICD-10-CM

## 2010-11-04 ENCOUNTER — Ambulatory Visit (INDEPENDENT_AMBULATORY_CARE_PROVIDER_SITE_OTHER): Payer: Medicare Other | Admitting: Internal Medicine

## 2010-11-04 ENCOUNTER — Ambulatory Visit (INDEPENDENT_AMBULATORY_CARE_PROVIDER_SITE_OTHER): Payer: Medicare Other

## 2010-11-04 ENCOUNTER — Encounter: Payer: Self-pay | Admitting: Internal Medicine

## 2010-11-04 DIAGNOSIS — J301 Allergic rhinitis due to pollen: Secondary | ICD-10-CM

## 2010-11-04 DIAGNOSIS — J309 Allergic rhinitis, unspecified: Secondary | ICD-10-CM

## 2010-11-12 NOTE — Assessment & Plan Note (Signed)
Summary: rov 1 yr/kp   Primary Provider/Referring Provider:  Guerry Bruin  CC:  Yearly follow up visit-allergies..  History of Present Illness: 11/06/08- Allergic rhinitis Continues allergy vaccine here without problems. Went to an urgent care for treatment of a sinus infection. Takes zyrtec most days for watery nose. Never wheeze or asthma problems.  November 05, 2009- Allergic rhinitis One year f/u. She notices her nose has begun to water in the last day or so. Never wheeze or asthma.  Following with Dr Daleen Squibb for PVCs. She continues to feel allergy vaccine helps. We talked about indications for retesting. Discussed flu season- has had flu vax.  November 04, 2010- Allergic rhinitis Nurse-CC: Yearly follow up visit-allergies. She says allergy vaccine continues to help her. Continues at 1:10 here without problem. Supplemental Zyretec taken at night, works well without sleepiness, if needed. She never has asthma.  We reviewed her flu and pnumovax hx- up to date. Minor cough occasionally, not bothersome and she attributes it to "'drainage".    Preventive Screening-Counseling & Management  Alcohol-Tobacco     Smoking Status: never  Current Medications (verified): 1)  Allergy Vaccine 1:10 Gh .... As Directed 2)  Atenolol 50 Mg Tabs (Atenolol) .Marland Kitchen.. 1 Tab Two Times A Day 3)  Prilosec Otc 20 Mg  Tbec (Omeprazole Magnesium) .... Take 1 Tablet By Mouth Once A Day 4)  Adult Aspirin Ec Low Strength 81 Mg  Tbec (Aspirin) .... Once Daily 5)  Cilostazol 100 Mg  Tabs (Cilostazol) .... Take 1 Tablet By Mouth Once A Day 6)  Amitriptyline Hcl 25 Mg  Tabs (Amitriptyline Hcl) .... Once Daily 7)  Vitamin D 1000 Unit Tabs (Cholecalciferol) .Marland Kitchen.. 1 Tab Once Daily 8)  Citracal + D 250-200 Mg-Unit  Tabs (Calcium Citrate-Vitamin D) .... Once Daily 9)  Alprazolam 0.5 Mg  Tabs (Alprazolam) .... Once Daily 10)  Pravastatin Sodium 40 Mg  Tabs (Pravastatin Sodium) .Marland Kitchen.. 1 Tab At Bedtime 11)  Zyrtec Allergy  10 Mg  Tabs (Cetirizine Hcl) .Marland Kitchen.. 1 Daily As Needed For Allergy 12)  Chlorthalidone 25 Mg Tabs (Chlorthalidone) .Marland Kitchen.. 1 Tab Once Daily 13)  Multivitamins   Tabs (Multiple Vitamin) .Marland Kitchen.. 1 Tab Once Daily 14)  Klor-Con M20 20 Meq Cr-Tabs (Potassium Chloride Crys Cr) .Marland Kitchen.. 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  The patient denies shortness of breath with activity, shortness of breath at rest, productive cough, non-productive cough, coughing up blood, chest pain, acid heartburn, indigestion, loss of appetite, weight change, abdominal pain, difficulty swallowing, sore throat, tooth/dental problems, headaches, nasal congestion/difficulty breathing through nose, sneezing, itching, rash, change in color of mucus, and fever.    Vital Signs:  Patient profile:   77 year old female Height:      59 inches Weight:      126.13 pounds  BMI:     25.57 O2 Sat:      96 % on Room air Pulse rate:   44 / minute BP sitting:   118 / 70  (left arm) Cuff size:   regular  Vitals Entered By: Reynaldo Minium CMA (November 04, 2010 11:25 AM)  O2 Flow:  Room air  Physical Exam  Additional Exam:  General: A/Ox3; pleasant and cooperative, NAD,  well-appearing SKIN: no rash, lesions NODES: no lymphadenopathy HEENT: [place]/AT, EOM- WNL, Conjuctivae- clear, PERRLA, TM-WNL, Nose- clear, Throat- clear and wnl, Mallampati III NECK: Supple w/ fair ROM, JVD- none, normal carotid impulses w/o bruits Thyroid-  CHEST: Clear to P&A, no wheeze or rales HEART: slightly irregular by me at 68. Nurse recorded 40 on arrival, no m/g/r heard ABDOMEN: Soft and nl;  ZOX:WRUE, nl pulses, no edema  NEURO: Grossly intact to observation      Impression & Recommendations:  Problem # 1:  ALLERGIC RHINITIS (ICD-477.9)  She is convinced that her allergy shots are worth continuing, and needs only occasional otc antihistamine supplementation. We discussed the unusually early pollen levels being recorded this year. No change required.  Her updated medication list for this problem includes:    Zyrtec Allergy 10 Mg Tabs (Cetirizine hcl) .Marland Kitchen... 1 daily as needed for allergy  Other Orders: Est. Patient Level III (45409)  Patient Instructions: 1)  Please schedule a follow-up appointment in 1 year.  Please call sooner as needed. 2)  Continuing allergy vaccine.

## 2010-11-13 ENCOUNTER — Ambulatory Visit (INDEPENDENT_AMBULATORY_CARE_PROVIDER_SITE_OTHER): Payer: Medicare Other

## 2010-11-13 DIAGNOSIS — J301 Allergic rhinitis due to pollen: Secondary | ICD-10-CM

## 2010-11-27 ENCOUNTER — Ambulatory Visit (INDEPENDENT_AMBULATORY_CARE_PROVIDER_SITE_OTHER): Payer: Medicare Other

## 2010-11-27 ENCOUNTER — Encounter: Payer: Self-pay | Admitting: Internal Medicine

## 2010-11-27 DIAGNOSIS — J301 Allergic rhinitis due to pollen: Secondary | ICD-10-CM

## 2010-12-02 NOTE — Assessment & Plan Note (Signed)
Summary: ALLERGY/CB   Nurse Visit   Allergies: No Known Drug Allergies  Orders Added: 1)  Allergy Injection (1) [95115] 

## 2010-12-04 ENCOUNTER — Encounter: Payer: Self-pay | Admitting: Internal Medicine

## 2010-12-04 ENCOUNTER — Ambulatory Visit (INDEPENDENT_AMBULATORY_CARE_PROVIDER_SITE_OTHER): Payer: Medicare Other

## 2010-12-04 DIAGNOSIS — J301 Allergic rhinitis due to pollen: Secondary | ICD-10-CM

## 2010-12-09 NOTE — Assessment & Plan Note (Signed)
Summary: ALLERGY/CB   Nurse Visit   Allergies: No Known Drug Allergies  Orders Added: 1)  Allergy Injection (1) [95115] 

## 2010-12-11 ENCOUNTER — Ambulatory Visit (INDEPENDENT_AMBULATORY_CARE_PROVIDER_SITE_OTHER): Payer: Medicare Other

## 2010-12-11 DIAGNOSIS — J301 Allergic rhinitis due to pollen: Secondary | ICD-10-CM

## 2010-12-11 LAB — IFOBT (OCCULT BLOOD): IFOBT: POSITIVE

## 2010-12-15 LAB — CBC
HCT: 39.5 % (ref 36.0–46.0)
Hemoglobin: 13.4 g/dL (ref 12.0–15.0)
Platelets: 328 10*3/uL (ref 150–400)
RBC: 4.44 MIL/uL (ref 3.87–5.11)
WBC: 13.9 10*3/uL — ABNORMAL HIGH (ref 4.0–10.5)

## 2010-12-15 LAB — POCT CARDIAC MARKERS
Myoglobin, poc: 45.1 ng/mL (ref 12–200)
Troponin i, poc: <0.05 ng/mL (ref 0.00–0.09)

## 2010-12-15 LAB — DIFFERENTIAL
Basophils Absolute: 0 10*3/uL (ref 0.0–0.1)
Lymphocytes Relative: 13 % (ref 12–46)
Lymphs Abs: 1.9 10*3/uL (ref 0.7–4.0)
Monocytes Relative: 3 % (ref 3–12)
Neutrophils Relative %: 81 % — ABNORMAL HIGH (ref 43–77)

## 2010-12-15 LAB — BASIC METABOLIC PANEL
CO2: 26 mEq/L (ref 19–32)
Chloride: 102 mEq/L (ref 96–112)
Glucose, Bld: 109 mg/dL — ABNORMAL HIGH (ref 70–99)
Potassium: 3.5 mEq/L (ref 3.5–5.1)

## 2010-12-16 ENCOUNTER — Encounter: Payer: Self-pay | Admitting: Internal Medicine

## 2010-12-18 ENCOUNTER — Ambulatory Visit (INDEPENDENT_AMBULATORY_CARE_PROVIDER_SITE_OTHER): Payer: Medicare Other

## 2010-12-18 DIAGNOSIS — J301 Allergic rhinitis due to pollen: Secondary | ICD-10-CM

## 2010-12-23 ENCOUNTER — Encounter: Payer: Self-pay | Admitting: Cardiology

## 2010-12-23 NOTE — Letter (Signed)
Summary: New Patient letter  [facility]  [address] [place], [place] [address]   Phone: [phone]  Fax: [phone]       12/16/2010 MRN: [id number]  [facility] [address] [place], [place]  [address]  [place]  Dear Ms. [facility],  Welcome to the Gastroenterology Division at [facility].    You are scheduled to see Dr.  Juanda Chance on 02-10-11 at 1:30pm on the 3rd floor at [facility], [address]  We ask that you try to arrive at our office 15 minutes prior to your appointment time to allow for check-in.  We would like you to complete the enclosed self-administered evaluation form prior to your visit and bring it with you on the day of your appointment.  We will review it with you.  Also, please bring a complete list of all your medications or, if you prefer, bring the medication bottles and we will list them.  Please bring your insurance card so that we may make a copy of it.  If your insurance requires a referral to see a specialist, please bring your referral form from your primary care physician.  Co-payments are due at the time of your visit and may be paid by cash, check or credit card.     Your office visit will consist of a consult with your physician (includes a physical exam), any laboratory testing he/she may order, scheduling of any necessary diagnostic testing (e.g. x-ray, ultrasound, CT-scan), and scheduling of a procedure (e.g. Endoscopy, Colonoscopy) if required.  Please allow enough time on your schedule to allow for any/all of these possibilities.    If you cannot keep your appointment, please call [phone] to cancel or reschedule prior to your appointment date.  This allows [place] the opportunity to schedule an appointment for another patient in need of care.  If you do not cancel or reschedule by 5 p.m. the business day prior to your appointment date, you will be charged a $50.00 late cancellation/no-show fee.    Thank you for choosing  [facility] for your medical needs.  We appreciate the opportunity to care for you.  Please visit [place] at our website  to learn more about our practice.                     Sincerely,                                                             The Gastroenterology Division

## 2010-12-24 ENCOUNTER — Ambulatory Visit: Payer: Medicare Other | Admitting: Cardiology

## 2010-12-25 ENCOUNTER — Ambulatory Visit (INDEPENDENT_AMBULATORY_CARE_PROVIDER_SITE_OTHER): Payer: Medicare Other

## 2010-12-25 DIAGNOSIS — J301 Allergic rhinitis due to pollen: Secondary | ICD-10-CM

## 2010-12-30 ENCOUNTER — Encounter (HOSPITAL_COMMUNITY): Payer: Medicare Other

## 2010-12-31 ENCOUNTER — Ambulatory Visit (HOSPITAL_COMMUNITY): Payer: Medicare Other | Attending: Internal Medicine

## 2010-12-31 DIAGNOSIS — M810 Age-related osteoporosis without current pathological fracture: Secondary | ICD-10-CM | POA: Insufficient documentation

## 2011-01-01 ENCOUNTER — Ambulatory Visit (INDEPENDENT_AMBULATORY_CARE_PROVIDER_SITE_OTHER): Payer: Medicare Other

## 2011-01-01 DIAGNOSIS — J309 Allergic rhinitis, unspecified: Secondary | ICD-10-CM

## 2011-01-06 ENCOUNTER — Other Ambulatory Visit: Payer: Self-pay | Admitting: Cardiology

## 2011-01-08 ENCOUNTER — Ambulatory Visit (INDEPENDENT_AMBULATORY_CARE_PROVIDER_SITE_OTHER): Payer: Medicare Other

## 2011-01-08 DIAGNOSIS — J309 Allergic rhinitis, unspecified: Secondary | ICD-10-CM

## 2011-01-15 ENCOUNTER — Ambulatory Visit (INDEPENDENT_AMBULATORY_CARE_PROVIDER_SITE_OTHER): Payer: Medicare Other

## 2011-01-15 DIAGNOSIS — J309 Allergic rhinitis, unspecified: Secondary | ICD-10-CM

## 2011-01-21 ENCOUNTER — Ambulatory Visit (INDEPENDENT_AMBULATORY_CARE_PROVIDER_SITE_OTHER): Payer: Medicare Other

## 2011-01-21 DIAGNOSIS — J309 Allergic rhinitis, unspecified: Secondary | ICD-10-CM

## 2011-01-22 ENCOUNTER — Encounter: Payer: Self-pay | Admitting: Cardiology

## 2011-01-22 ENCOUNTER — Ambulatory Visit (INDEPENDENT_AMBULATORY_CARE_PROVIDER_SITE_OTHER): Payer: Medicare Other | Admitting: Cardiology

## 2011-01-22 VITALS — BP 110/66 | HR 78 | Resp 17 | Ht 59.0 in | Wt 123.1 lb

## 2011-01-22 DIAGNOSIS — I739 Peripheral vascular disease, unspecified: Secondary | ICD-10-CM

## 2011-01-22 DIAGNOSIS — I4949 Other premature depolarization: Secondary | ICD-10-CM

## 2011-01-22 NOTE — Progress Notes (Signed)
   Patient ID: Victoria Joseph, female    DOB: 05/31/1932, 77 y.o.   MRN: [id number]  HPI  Victoria Joseph returns for E and M of her PVC's and PAD. She denies palpitations. She has been walking one and one half miles a day with predictable calf claudication. She stops and it goes away, restarts walking and it does not return. Pletal has helped. She denies any sores or ulcers.  EKG shows NSR with frequent unifocal PVC's.      Review of Systems  All other systems reviewed and are negative.      Physical Exam  Nursing note and vitals reviewed. Constitutional: She is oriented to person, place, and time. She appears well-developed and well-nourished.  HENT:  Head: Normocephalic and atraumatic.  Eyes: EOM are normal. Pupils are equal, round, and reactive to light.  Neck: Neck supple. No JVD present. No tracheal deviation present. No thyromegaly present.  Cardiovascular: Normal rate, S1 normal and S2 normal.  An irregular rhythm present.  Extrasystoles are present. PMI is not displaced.  Exam reveals decreased pulses. Exam reveals no S3.     No systolic murmur is present  Pulses:      Dorsalis pedis pulses are 0 on the right side, and 0 on the left side.       Posterior tibial pulses are 1+ on the right side, and 1+ on the left side.  Pulmonary/Chest: Effort normal and breath sounds normal.  Abdominal: Soft. Bowel sounds are normal.  Musculoskeletal: Normal range of motion. She exhibits no edema.  Neurological: She is alert and oriented to person, place, and time.  Skin: Skin is warm and dry.  Psychiatric: She has a normal mood and affect.

## 2011-01-22 NOTE — Assessment & Plan Note (Signed)
Asymptomatic, continue beta blocker.

## 2011-01-22 NOTE — Patient Instructions (Signed)
Your physician recommends that you schedule a follow-up appointment in: 1 year with Dr. Wall  

## 2011-01-29 ENCOUNTER — Ambulatory Visit (INDEPENDENT_AMBULATORY_CARE_PROVIDER_SITE_OTHER): Payer: Medicare Other

## 2011-01-29 DIAGNOSIS — J309 Allergic rhinitis, unspecified: Secondary | ICD-10-CM

## 2011-02-03 NOTE — Assessment & Plan Note (Signed)
[facility]                            CARDIOLOGY OFFICE NOTE   NAME:Espinoza, Ellenore C                       MRN:          [id number]  DATE:08/15/2008                            DOB:          [birth year]    Ms. Justus comes in today for followup.   She is having no symptoms of angina or ischemia.  She is very concerned  because one of her friends was doing fine and had a heart attack before  going to bed one night.  She had had a stent.   I have reassured Ms. Kats that she is not having any symptoms of  ischemia.  In addition, she had a stress Myoview about a year and a half  ago that showed no ischemia.   She is on an excellent medical program including:  1. Atenolol 50 mg p.o. b.i.d. for her asymptomatic PVCs and      hypertension.  2. Pravastatin 20 mg nightly.  3. Chlorthalidone for her blood pressure.  4. Aspirin 81 mg a day.   PHYSICAL EXAMINATION:  VITAL SIGNS:  Her blood pressure today is 126/66,  her heart rate is 80 and regular.  She is in sinus rhythm.  Her weight  is 125.  HEENT:  Normal.  NECK:  Carotid upstrokes are equal bilaterally without bruits.  No JVD.  Thyroid is not enlarged.  Trachea is midline.  LUNGS:  Clear to auscultation and percussion.  HEART:  Regular rate and rhythm.  No ectopy.  ABDOMEN:  Soft.  Good bowel sounds.  EXTREMITIES:  No cyanosis, clubbing, or edema.  Pulses are present, but  reduced.   Ms. Norwood is doing well.  I have made no changes in her medical  program.  We will plan on seeing her back again in a year.   We talked at length about symptoms of an acute coronary syndrome or  exertional angina.  We also talked about how to respond appropriately if  that occurred.     Thomas C. Daleen Squibb, MD, [facility]  Electronically Signed    TCW/MedQ  DD: 08/15/2008  DT: 08/16/2008  Job #: [id number]   cc:   Gaspar Garbe, M.D.

## 2011-02-03 NOTE — Assessment & Plan Note (Signed)
[facility]                             PULMONARY OFFICE NOTE   NAME:Blubaugh, Matrice C                       MRN:          [id number]  DATE:06/03/2007                            DOB:          1932/08/16    PROBLEM:  1. Allergic rhinitis.  2. Atopic bronchitis.   HISTORY:  This never-smoker had been followed at [facility] and has continued allergy vaccine.  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  She remembers being treated with Levaquin  and with Septra.  Currently she has pretty well settled down, still  feeling just a little bit of nasal congestion.  Postnasal drainage is  clear mucous with no headache or fever.  She wanted to re-establish for  followup and to continue allergy vaccine.  She has not had significant  chest symptoms.   MEDICATIONS:  1. Allergy vaccine.  2. Atenolol with diuretic 50/25.  3. Pepcid AC.  4. Aspirin 81 mg.  5. Amitriptyline 25 mg.  6. Fosamax.  7. Vitamin D.  8. Alprazolam 0.5 mg p.r.n.  9. Pravastatin 40 mg.   ALLERGIES:  No medication allergy.   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   OBJECTIVE:  VITAL SIGNS: Weight 122 pounds.  Blood pressure 122/78.  Pulse 74.  Room air saturation 100%.  HEENT:  Conjunctivae are clear.  Nasal mucosa is not edematous.  I don't  see evidence of postnasal drainage.  Pharynx is not inflamed.  Voice  quality is normal.  There is no stridor.  LYMPH NODES:  I do not find adenopathy.  CHEST:  Her chest sounds quiet and clear.  HEART:  Sounds are regular.  I don't hear a murmur today.   IMPRESSION AND PLANS:  Rhinitis.  She has had some episodes of sinusitis  by description.  She is not describing anything now except possibly a  little  post-inflammatory change as secretions slow down.  She is  draining well with no evidence of obstruction or infection.  I told her  we should wait on further antibiotics.  I introduced the concept of  nasal saline lavage and  she will combine that with a trial of Sudafed PE taken just in the  daytime, p.r.n.  Schedule return in one year, earlier p.r.n.     Clinton D. Maple Hudson, MD, [facility], FACP  Electronically Signed    CDY/MedQ  DD: 06/04/2007  DT: 06/05/2007  Job #: [id number]   cc:   Gaspar Garbe, M.D.

## 2011-02-05 ENCOUNTER — Ambulatory Visit (INDEPENDENT_AMBULATORY_CARE_PROVIDER_SITE_OTHER): Payer: Medicare Other

## 2011-02-05 DIAGNOSIS — J309 Allergic rhinitis, unspecified: Secondary | ICD-10-CM

## 2011-02-06 NOTE — Op Note (Signed)
[facility]  Patient:    Victoria Joseph, Victoria Joseph Visit Number: [id number] MRN: [id number]          Service Type: SUR Location: 3W [id number] 02 Attending Physician:  Delsa Bern Dictated by:   Lorne Skeens. Hoxworth, M.D. Proc. Date: 04/05/02 Admit Date:  04/05/2002 Discharge Date: 04/06/2002                             Operative Report  PREOPERATIVE DIAGNOSES:  Cholelithiasis and cholecystitis.  POSTOPERATIVE DIAGNOSES:  Cholelithiasis and cholecystitis.  PROCEDURE:  Laparoscopic cholecystectomy.  SURGEON:  Lorne Skeens. Hoxworth, M.D.  ANESTHESIA:  General.  BRIEF HISTORY:  The patient is a 77 year old white female with episodic epigastric and right upper quadrant abdominal pain. She has had an evaluation including a gallbladder ultrasound showing echogenic material in the gallbladder fossa possibly gallstones and a HIDA scan showing non-visualization of the gallbladder. She was felt to have ongoing cholecystitis and biliary colic and laparoscopic cholecystectomy has been recommended and accepted. The nature of the procedure, its indications, risk of bleeding, infection, bile leak and bile duct injury and possible need for open procedure were discussed and understood. She is now brought to the operating room for this procedure.  DESCRIPTION OF PROCEDURE:  The patient was placed in the supine position on the operating table and general endotracheal anesthesia was induced. She received preoperative antibiotics. The abdomen was sterilely prepped and draped. Local anesthesia was used to infiltrate the trocar sites. A 1 cm incision was made at the umbilicus and dissection carried down the midline fascia which was sharply incised for 1 cm and the peritoneum entered under direct vision. A Hasson trocar was placed through a mattress suture of #0 Vicryl and pneumoperitoneum established. Under direct vision, a 10 mm trocar was placed in the subxiphoid  area and two 5 mm trocars along the right subcostal margin. The gallbladder was somewhat distended and obviously chronic ally inflamed. The fundus was grasped and elevated up over the liver and infundibulum retracted inferolaterally. Some omental adhesions were taken down off the gallbladder with the cautery. Fibrofatty tissue was stripped down off the neck of the gallbladder toward the porta hepatis. Anterior and posterior peritoneum over Calots triangle was incised and Calots triangle thoroughly dissected. The cystic duct was identified and dissected until it tapered down to a normal size. The cystic artery was identified coursing along and exited up on the gallbladder wall and when the anatomy was clear, it was doubly clipped proximally, clipped distally and divided as was the cystic duct. The gallbladder was then dissected free from its bed using hook cautery and removed intact at the umbilicus. Hemostasis was assured in the gallbladder bed and the right upper quadrant irrigated and suctioned until clear. Trocars removed under direct vision and all CO2 evacuated from the peritoneal cavity. The mattress suture was secured at the umbilicus. The skin incisions were closed with interrupted subcuticular 4-0 Monocryl and Steri-Strips. Sponge, needle and instrument counts were correct. A dry sterile dressing was applied and the patient taken to the recovery room in good condition. Dictated by:   Lorne Skeens. Hoxworth, M.D. Attending Physician:  Delsa Bern DD:  04/05/02 TD:  04/08/02 Job: [id number]

## 2011-02-06 NOTE — Assessment & Plan Note (Signed)
[facility]                               PULMONARY OFFICE NOTE   NAME:Victoria Joseph, Victoria Joseph                       MRN:          [id number]  DATE:05/11/2006                            DOB:          1932/09/18    PROBLEM LIST:  1. Allergic rhinitis.  2. Recurrent bronchitis.   HISTORY:  One-year followup, now feeling that she is doing quite well and  stable on current management. She has had no special problems since last  here and expresses no concerns. She continues allergy vaccine at 1:10 given  here and well tolerated.   MEDICATIONS:  1. Atenolol/hydrochlorothiazide 50/25.  2. Pepcid AC.  3. Aspirin 81 mg.  4. Cilest gel.  5. Lorazepam 0.5 mg.  6. Amitriptyline 25 mg.  7. Fosamax.  8. Vitamin D.   ALLERGIES:  No medication allergies.   OBJECTIVE:  VITAL SIGNS:  Weight 117 pounds, blood pressure 112/58, pulse  regular 91, room air saturation 97%.  EYES, NOSE, THROAT:  Clear. I see no evidence of postnasal drainage or  significant congestion. Voice quality is normal. There is no stridor.  LUNGS:  Clear to P&A with unlabored breathing.  HEART:  Heart sounds are regular without murmur.  SKIN:  No evident rash.   IMPRESSION:  Stable mild intermittent asthma and rhinitis.   PLAN:  Continue vaccine and present medications. Schedule to return in one  year, earlier p.r.n.                                   Clinton D. Maple Hudson, MD, [facility], FACP   CDY/MedQ  DD:  05/12/2006  DT:  05/13/2006  Job #:  [id number]   cc:   Gaspar Garbe, MD

## 2011-02-06 NOTE — Assessment & Plan Note (Signed)
[facility]                            CARDIOLOGY OFFICE NOTE   NAME:Dice, Klani C                       MRN:          [id number]  DATE:11/11/2006                            DOB:          [birth year]    Ms. Matte returns today for further management of her PVCs.  Please  see my note from November 01, 2006.  They are totally asymptomatic.   Her potassium checked out at 4.5.  A stress Myoview shows normal  myocardial perfusion.  The ejection fraction could not be calculated  because of ectopy.  However, her 2D echocardiogram showed an ejection  fraction of 60% to 65%.  There were no wall motion abnormalities.  There  was also no left ventricular hypertrophy.  She did have mildly redundant  mitral valve leaflets with mild mitral regurgitation.  There was mild  left atrial enlargement.   She is still taking atenolol/chlorthalidone 50/25 b.i.d.  She is getting  up quite frequently at night.   Her blood pressure today is 134/66.  Her pulse is in the 90s and fairly  regular today.  Her weight is 121.  The rest of her exam is unchanged.   ASSESSMENT:  1. Asymptomatic frequent ventricular ectopy with no evidence of      nonsustained ventricular tachycardia.  2. Normal left ventricular function.  3. No evidence of left ventricular hypertrophy.  4. Hypertension, under good control.  5. Diuretic-induced nocturia.  6. Redundant mitral valve leaflets with mild to, at most, moderate      mitral regurgitation with mild left atrial enlargement.   PLAN:  I have discussed the above in detail with the patient.  I have  recommended we change her to atenolol 50 b.i.d. and take the  chlorthalidone 25 mg q.a.m. by itself.  She will follow up with Dr.  Wylene Simmer.  At this point in time, I see no reason to evaluate these  further.  If she becomes symptomatic, I have asked her to return for  further evaluation.     Thomas C. Daleen Squibb, MD, [facility]  Electronically  Signed    TCW/MedQ  DD: 11/11/2006  DT: 11/11/2006  Job #: [id number]   cc:   Gaspar Garbe, M.D.

## 2011-02-06 NOTE — H&P (Signed)
[facility]  PatientGeena, [facility] Visit Number: [id number] MRN: [id number]          Service Type: Attending:  Beather Arbour. Thomasena Edis, M.D. Dictated by:   Beather Arbour Thomasena Edis, M.D. Adm. Date:  07/13/01   CC:         [facility] [place] [facility] [place]                         History and Physical  CURRENT HISTORY:              The patient is a 77 year old Caucasian female who complained of postmenopausal bleeding in March and April.  She is a gravida 3, para 3, SAB 1.  She has had no further bleeding since April.  She subsequently underwent a sonohistogram which showed a focal rounded area of endometrial thickening which could be possible endometrial polyp at the posterior fundal endometrium.  She is thus admitted for a D&C, hysteroscopy, and polypectomy.  Risks of surgery including anesthetic complications, infection, damage to adjacent structures including bladder, bowel, blood vessels, and ureters were discussed with the patient.  She was made aware of the risks of uterine perforation which could result in overwhelming life threatening hemorrhage requiring emergent hysterectomy or uterine perforation which could result in bowel damage requiring emergent colostomy or resulting overwhelming life threatening peritonitis.  She expressed understanding of and acceptance of these risks.  PAST OB/GYN HISTORY:          Menarche at age 83.  The patient has never had an abnormal Pap.  PAST MEDICAL HISTORY:         Hypercholesterolemia, hypertension, and GERD.  ALLERGIES:                    No known drug allergies.  CURRENT MEDICATIONS:          Atenolol, Evista, Womens Bayer aspirin, Prevacid 30 mg daily, Tenoretic, and Pletal.  PAST SURGICAL HISTORY:        Right salpingo-oophorectomy in 1956, right ovarian cyst, right knee surgery in 1950s.  FAMILY HISTORY:               The patients mother is 30 with hypertension and heart disease.   The patients father is 73 with diabetes.  One brother age 53, alive and well.  One sister age 64, alive and well.  Two children, age 97 and 4, alive and well.  OCCUPATION:                   The patient is an Forensic psychologist for [employer]. She is married and uses no tobacco or alcohol.  REVIEW OF SYSTEMS:            Noncontributory except as noted above.  Denies headaches, visual changes, chest pain, shortness of breath, abdominal pain, change in bowel habits, unintentional weight loss, urgency, frequency, vaginal pruritus, or pain or bleeding with intercourse.  PHYSICAL EXAMINATION:  GENERAL APPEARANCE:           This is a well-developed, pleasant Caucasian female.  HEENT:                        Normal.  NECK:                         Supple without thyromegaly, adenopathy, or nodules.  CHEST:  Clear to auscultation.  BREASTS:                      Without masses, _________, or retraction.  VITAL SIGNS:                  Blood pressure 134/60 and weight 108.  CARDIAC EXAMINATION:          Regular rate and rhythm without extra sounds or murmurs.  ABDOMEN:                      Soft and nontender.  No hepatosplenomegaly or masses.  EXTREMITIES:                  No cyanosis, clubbing, or edema.  NEUROLOGIC:                   Oriented x 3.  Grossly normal.  PELVIC EXAMINATION:           Normal external female genitalia.  No vulvar, vaginal, or cervical lesions.  The patient had a recent Pap smear by Dr. Daphine Deutscher which was within normal limits.  Bimanual examination revealed the uterus to be normal, mobile, and without any adnexal mass palpated.  ASSESSMENT AND PLAN:          The patient is a 77 year old Caucasian female with postmenopausal bleeding and a possible posterior fundal endometrial polyp.  Admitted for D&C, hysteroscopy, and treatment of the polyp.  Risks have been explained to the patient.  She expresses understanding of and acceptance of these  risks. Dictated by:   Beather Arbour Thomasena Edis, M.D. Attending:  Beather Arbour. Thomasena Edis, M.D. DD:  07/13/01 TD:  07/13/01 Job: [id number]

## 2011-02-06 NOTE — Assessment & Plan Note (Signed)
[facility]                            CARDIOLOGY OFFICE NOTE   NAME:Victoria Joseph, Victoria Joseph                       MRN:          [id number]  DATE:11/01/2006                            DOB:          [birth year]    CHIEF COMPLAINT:  I need my heart evaluated.   HISTORY OF PRESENT ILLNESS:  [name] is a 77 year old, widowed,  white female, daughter of a former patient of mine named Carney Harder,  who comes today because of frequent PVCs and ventricular bigemini on a  recent EKG.  This was picked up while she was having a peripheral  arterial Doppler for PVD.   She is totally asymptomatic.  She does notice that she gets a tired  quicker and fatigued when she is trying to walk.  Even with her PVD,  she can walk all three levels of the mall.   She denies any angina, ischemic symptoms such as dyspnea, tachy  palpitations, presyncope, syncope.   PAST MEDICAL HISTORY:  1. As mentioned above, she has a history of peripheral vascular      disease, diagnosed in early 2000.  She sees Dr. Liliane Bade who is      treating it medically with Pletal.  She has improved.  She lacks      any conventional risk factors other than the fact that her      cholesterol may be high now she says.  She does not smoke.  No      history of diabetes.  2. She does have a history of hypertension over the last year. Dr.      Wylene Simmer increased her atenolol/chlorthalidone 50/25 to twice a day.      She notices no difference.  Her potassium was normal.  She denies      any heat intolerance, cold intolerance, skin changes, constipation.   CURRENT MEDICATIONS:  1. She takes an allergy vaccine.  2. Atenolol/chlorthalidone 50/25, b.i.d.  3. Pepcid AC.  4. Aspirin 81 mg a day.  5. Pletal 100 mg b.i.d.  6. Amitriptyline 25 mg a day.  7. Fosamax every day.  8. Vitamin D every day.  9. Women's multivitamin every day.  10.Alprazolam 0.5 mg every day.   SHE HAS NO KNOWN DRUG  ALLERGIES.  SHE HAS NO DYE ALLERGIES.   SURGERIES:  1. She has had knee surgery in 1953.  2. She had her ovaries removed in 1956.  3. Cholecystectomy in 2003.   FAMILY HISTORY:  Really noncontributory.   SOCIAL HISTORY:  She just retired from [employer].  She is widowed and  lost her husband and her mother all within a year.  She has 2 children.   REVIEW OF SYSTEMS:  Other than the HPI is negative.   PHYSICAL EXAMINATION:  GENERAL:  An extremely pleasant, looks younger  than stated age.  VITAL SIGNS:  Blood pressure is 130/70.  Her pulse is 95 and irregular.  She is having frequent PVCs.  In fact, when we took her EKG she had  ventricular bigemini.  She is 4 feet 11 inches, weighs  119 pounds.  HEENT:  Skin is warm and dry.  She is in no acute distress.  PERRLA.  Extraocular movements intact.  Sclerae are clear.  Facial symmetry is  normal.  Carotids are full.  There are no bruits.  Thyroid is not  enlarged.  Trachea is midline.  NECK:  Supple.  LUNGS:  Clear.  HEART:  Reveals a nondisplaced PMI.  She has a variable S1 and S2.  S2 I  could not hear split.  ABDOMEN:  Soft.  There are no midline bruits.  EXTREMITIES:  Reveal no cyanosis, clubbing, or edema.  Femoral pulses  were 2 plus over 4 plus.  Peripheral pulses are diminished but were 1  plus over 4 plus bilaterally.  There is no edema.  NEUROLOGIC:  Grossly intact.  SKIN:  Intact.   ASSESSMENT:  1. Asymptomatic ventricular ectopy, even in bigeminal pattern.  2. History of hypertension.  3. Peripheral vascular disease being treated medically.  4. Hyperlipidemia.   As I discussed with Ms. Altizer, she probably has some degree of  coronary artery disease.  We need to make sure she does not have any  obstructive disease that could be causing ventricular irritability.   PLAN:  1. Adenosine Myoview with her history of PVD.  2. Continue atenolol.  3. A 2D echo to rule out any LVH or other structural heart disease.   I  will have her return in a week to discuss these studies.     Thomas Joseph. Daleen Squibb, MD, [facility]  Electronically Signed    TCW/MedQ  DD: 11/01/2006  DT: 11/01/2006  Job #: [id number]   cc:   Gaspar Garbe, M.D.

## 2011-02-06 NOTE — Op Note (Signed)
[facility]  Patient:    Victoria Joseph, Victoria Joseph [facility] Visit Number: [id number] MRN: [id number]          Service Type: DSU Location: [facility] Attending Physician:  Madelyn Flavors Dictated by:   Beather Arbour Thomasena Edis, M.D. Proc. Date: 07/13/01 Admit Date:  07/13/2001   CC:         Darius Bump, M.D.   Operative Report  PREOPERATIVE DIAGNOSES:       Postmenopausal bleeding, calcified endometrial polyp.  POSTOPERATIVE DIAGNOSES:      Postmenopausal bleeding, calcified endometrial polyp, free floating calcification, no evidence of calcified endometrial polyp.  PROCEDURE:                    Dilatation and curettage, hysteroscopy.  SURGEON:                      Beather Arbour. Thomasena Edis, M.D.  ANESTHESIA:                   Monitored anesthesia care plus 10 cc 1% lidocaine paracervical block.  ESTIMATED BLOOD LOSS:         Minimal.  DRAINS:                       None.  COMPLICATIONS:                None.  FLUIDS:                       2000 cc Crystalloid.  FINDINGS:                     Thickened posterior endometrium, free floating calcification.  DESCRIPTION OF OPERATION:     The patient was brought to the operating room and identified on the operating room table.  After the patient was adequately sedated she was placed in the dorsal lithotomy position and prepped and draped in the usual sterile fashion.  The bladder was straight catheterized for approximately 100 cc of clear yellow urine.  A bimanual examination revealed the uterus to be retroverted, normal size, mobile without any adnexal mass palpated.  Speculum was placed and the posterior lip of the cervix was infiltrated with 1 cc of 1% lidocaine and grasped with a single tooth tenaculum.  The remaining 9 cc of 1% lidocaine were infused for paracervical block.  The cervix was very gently dilated up to a #25 Pratt dilator. Dilatation proceeded smoothly and carefully to decrease the risk of  uterine perforation.  The uterus sounded to approximately 7 cm.  Using the ACMI hysteroscope with Sorbitol as the distending medium, a careful and thorough hysteroscopic examination was performed.  Both tubal ostia were identified. There was noted to be a free floating calcification which is obviously not the etiology of the postmenopausal bleeding.  The posterior endometrium was noted to be very thickened and stipled in appearance.  The scope was withdrawn and an attempt was made to recover the calcification but this was very small and I was unable to do so.  However, this was just a free floating entity.  Careful curettage was performed with tissue obtained.  The ______ Stone forceps were placed with additional tissue obtained.  At that point the procedure was then terminated when a good cry was heard all around.  The patient tolerated procedure well without apparent complications and was transferred to the recovery room in stable condition  after instrument, sponge, needle counts were correct.  It should be noted that when a tenaculum was removed there was noted to be no bleeding from the tenaculum site and only minimal uterine bleeding. The patient was given the postoperative instruction sheet.  Urged to take ibuprofen 400-600 mg q.6h. p.r.n. pain, to call for heavy bleeding, and return to the office in two weeks for postoperative examination.  She was also to refrain from intercourse for that time. Dictated by:   Beather Arbour Thomasena Edis, M.D. Attending Physician:  Madelyn Flavors DD:  07/13/01 TD:  07/14/01 Job: [id number]

## 2011-02-10 ENCOUNTER — Ambulatory Visit: Payer: Medicare Other | Admitting: Internal Medicine

## 2011-02-20 ENCOUNTER — Ambulatory Visit (INDEPENDENT_AMBULATORY_CARE_PROVIDER_SITE_OTHER): Payer: Medicare Other

## 2011-02-20 DIAGNOSIS — J309 Allergic rhinitis, unspecified: Secondary | ICD-10-CM

## 2011-02-23 ENCOUNTER — Encounter: Payer: Self-pay | Admitting: Internal Medicine

## 2011-02-26 ENCOUNTER — Ambulatory Visit (INDEPENDENT_AMBULATORY_CARE_PROVIDER_SITE_OTHER): Payer: Medicare Other

## 2011-02-26 DIAGNOSIS — J309 Allergic rhinitis, unspecified: Secondary | ICD-10-CM

## 2011-03-03 ENCOUNTER — Other Ambulatory Visit: Payer: Self-pay | Admitting: Cardiology

## 2011-03-05 ENCOUNTER — Ambulatory Visit (INDEPENDENT_AMBULATORY_CARE_PROVIDER_SITE_OTHER): Payer: Medicare Other

## 2011-03-05 ENCOUNTER — Ambulatory Visit: Payer: Medicare Other | Admitting: Internal Medicine

## 2011-03-05 DIAGNOSIS — J309 Allergic rhinitis, unspecified: Secondary | ICD-10-CM

## 2011-03-12 ENCOUNTER — Ambulatory Visit (INDEPENDENT_AMBULATORY_CARE_PROVIDER_SITE_OTHER): Payer: Medicare Other

## 2011-03-12 DIAGNOSIS — J309 Allergic rhinitis, unspecified: Secondary | ICD-10-CM

## 2011-03-17 ENCOUNTER — Ambulatory Visit (INDEPENDENT_AMBULATORY_CARE_PROVIDER_SITE_OTHER): Payer: Medicare Other

## 2011-03-17 DIAGNOSIS — J309 Allergic rhinitis, unspecified: Secondary | ICD-10-CM

## 2011-03-18 ENCOUNTER — Ambulatory Visit (INDEPENDENT_AMBULATORY_CARE_PROVIDER_SITE_OTHER): Payer: Medicare Other

## 2011-03-18 DIAGNOSIS — J309 Allergic rhinitis, unspecified: Secondary | ICD-10-CM

## 2011-03-27 ENCOUNTER — Ambulatory Visit (INDEPENDENT_AMBULATORY_CARE_PROVIDER_SITE_OTHER): Payer: Medicare Other

## 2011-03-27 DIAGNOSIS — J309 Allergic rhinitis, unspecified: Secondary | ICD-10-CM

## 2011-04-02 ENCOUNTER — Ambulatory Visit (INDEPENDENT_AMBULATORY_CARE_PROVIDER_SITE_OTHER): Payer: Medicare Other

## 2011-04-02 DIAGNOSIS — J309 Allergic rhinitis, unspecified: Secondary | ICD-10-CM

## 2011-04-09 ENCOUNTER — Ambulatory Visit (INDEPENDENT_AMBULATORY_CARE_PROVIDER_SITE_OTHER): Payer: Medicare Other

## 2011-04-09 DIAGNOSIS — J309 Allergic rhinitis, unspecified: Secondary | ICD-10-CM

## 2011-04-16 ENCOUNTER — Ambulatory Visit (INDEPENDENT_AMBULATORY_CARE_PROVIDER_SITE_OTHER): Payer: Medicare Other

## 2011-04-16 DIAGNOSIS — J309 Allergic rhinitis, unspecified: Secondary | ICD-10-CM

## 2011-04-22 ENCOUNTER — Ambulatory Visit (INDEPENDENT_AMBULATORY_CARE_PROVIDER_SITE_OTHER): Payer: Medicare Other | Admitting: Internal Medicine

## 2011-04-22 ENCOUNTER — Ambulatory Visit (INDEPENDENT_AMBULATORY_CARE_PROVIDER_SITE_OTHER): Payer: Medicare Other

## 2011-04-22 ENCOUNTER — Encounter: Payer: Self-pay | Admitting: Internal Medicine

## 2011-04-22 VITALS — BP 100/48 | HR 80 | Ht 59.0 in | Wt 120.0 lb

## 2011-04-22 DIAGNOSIS — J309 Allergic rhinitis, unspecified: Secondary | ICD-10-CM

## 2011-04-22 DIAGNOSIS — R195 Other fecal abnormalities: Secondary | ICD-10-CM

## 2011-04-22 MED ORDER — CILIDINIUM-CHLORDIAZEPOXIDE 2.5-5 MG PO CAPS: ORAL_CAPSULE | ORAL | Status: DC

## 2011-04-22 MED ORDER — PEG-KCL-NACL-NASULF-NA ASC-C 100 G PO SOLR: 1.0000 | Freq: Once | ORAL | Status: DC

## 2011-04-22 NOTE — Patient Instructions (Addendum)
You have been scheduled for an endoscopy and colonoscopy. Please follow the written instructions given to you at your visit today. Please pick up your Moviprep at the pharmacy within the next 2-3 days. We have sent the following medications to your pharmacy for you to pick up at your convenience: Librax. Take 1 tablet by mouth before meals as needed. CC: Dr Wylene Simmer

## 2011-04-22 NOTE — Progress Notes (Signed)
Victoria Joseph 02/13/1932 MRN [id number]     History of Present Illness:  This is a 77 year old, white female with a positive home test for occult blood. She denies seeing any bright red blood or black stools. She denies abdominal pain or weight loss. There is a history of irritable bowel syndrome with predominant diarrhea which was followed by Dr Sherin Quarry prior to his retirement. Her last colonoscopy in September 2007 showed moderately severe diverticulosis of the left colon. She has gastroesophageal reflux for which she takes Pepcid 20 mg daily. She denies dysphagia or odynophagia. She is also on aspirin 81 mg daily and Pletal. There is no family history of colon cancer. She had recent blood tests at Dr.Tisovec's office and they were reported to be normal.    Past Medical History  Diagnosis Date  . Hypertension   . Hyperlipidemia   . URI (upper respiratory infection)   . Pneumonia 2009  . Arrhythmia     atrial fibrillation  . Rhinitis, allergic   . Osteoporosis   . Osteoarthritis   . PVD (peripheral vascular disease)   . GERD (gastroesophageal reflux disease)   . Diverticulosis   . IBS (irritable bowel syndrome)   . Anxiety   . Sinusitis    Past Surgical History  Procedure Date  . Knee surgery     right repair after fall  . Oophorectomy     still have 1 ovary  . Appendectomy   . Cholecystectomy     reports that she has never smoked. She has never used smokeless tobacco. She reports that she does not drink alcohol or use illicit drugs. family history includes Asthma in her paternal grandfather and Heart failure in her mother. No Known Allergies      Review of Systems:Denies chest pain, shortness of breath, abdominal pain or weight loss  The remainder of the 10  point ROS is negative except as outlined in H&P   Physical Exam: General appearance  Well developed, in no distress. Eyes- non icteric. HEENT nontraumatic, normocephalic. Mouth no lesions, tongue  papillated, no cheilosis. Neck supple without adenopathy, thyroid not enlarged, no carotid bruits, no JVD. Lungs Clear to auscultation bilaterally, mild kyphosis of the thoracic spine. Cor normal S1 normal S2, regular rhythm , no murmur,  quiet precordium. Abdomen Soft nontender abdomen with normal active bowel sounds. No tenderness. Liver edge at costal margin.  Rectal:Soft Hemoccult positive stool, external hemorrhoidal tags. extremities : no pedal edema. Skin no lesions. Neurological alert and oriented x 3. Psychological normal mood and affect.  Assessment and Plan:  Problem #1 Hemoccult positive stool on a home test and also on my exam today without evidence of anemia. Patient remains asymptomatic. There is question of external hemorrhoidal tags causing irritation or possibly AV malformations. We also need to rule out neoplasm or an upper GI source of bleeding related to aspirin. It has been 5 years since her last colonoscopy. We will schedule her for an upper endoscopy and colonoscopy to look for a bleeding lesion.  Problem #2 Irritable bowel syndrome with predominant diarrhea. We will prescribe Librax to take as needed. She has taken this in the past for IBS.   04/22/2011 [name]

## 2011-04-27 ENCOUNTER — Other Ambulatory Visit: Payer: Self-pay | Admitting: Internal Medicine

## 2011-04-27 DIAGNOSIS — Z1231 Encounter for screening mammogram for malignant neoplasm of breast: Secondary | ICD-10-CM

## 2011-04-28 ENCOUNTER — Telehealth: Payer: Self-pay | Admitting: Internal Medicine

## 2011-04-28 NOTE — Telephone Encounter (Signed)
Forwarded to Dr. Brodie for review. °

## 2011-05-01 ENCOUNTER — Encounter: Payer: Self-pay | Admitting: Internal Medicine

## 2011-05-01 ENCOUNTER — Ambulatory Visit (AMBULATORY_SURGERY_CENTER): Payer: Medicare Other | Admitting: Internal Medicine

## 2011-05-01 DIAGNOSIS — K2940 Chronic atrophic gastritis without bleeding: Secondary | ICD-10-CM

## 2011-05-01 DIAGNOSIS — R195 Other fecal abnormalities: Secondary | ICD-10-CM

## 2011-05-01 DIAGNOSIS — K219 Gastro-esophageal reflux disease without esophagitis: Secondary | ICD-10-CM

## 2011-05-01 DIAGNOSIS — K5730 Diverticulosis of large intestine without perforation or abscess without bleeding: Secondary | ICD-10-CM

## 2011-05-01 DIAGNOSIS — K2961 Other gastritis with bleeding: Secondary | ICD-10-CM

## 2011-05-01 MED ORDER — SODIUM CHLORIDE 0.9 % IV SOLN: 500.0000 mL | INTRAVENOUS | Status: DC

## 2011-05-01 MED ORDER — PANTOPRAZOLE SODIUM 40 MG PO TBEC: 40.0000 mg | DELAYED_RELEASE_TABLET | Freq: Every day | ORAL | Status: DC

## 2011-05-04 ENCOUNTER — Telehealth: Payer: Self-pay

## 2011-05-04 NOTE — Telephone Encounter (Signed)

## 2011-05-05 ENCOUNTER — Ambulatory Visit (INDEPENDENT_AMBULATORY_CARE_PROVIDER_SITE_OTHER): Payer: Medicare Other

## 2011-05-05 DIAGNOSIS — J309 Allergic rhinitis, unspecified: Secondary | ICD-10-CM

## 2011-05-06 ENCOUNTER — Encounter: Payer: Self-pay | Admitting: Internal Medicine

## 2011-05-07 ENCOUNTER — Encounter: Payer: Self-pay | Admitting: Internal Medicine

## 2011-05-18 ENCOUNTER — Telehealth: Payer: Self-pay | Admitting: Internal Medicine

## 2011-05-18 ENCOUNTER — Ambulatory Visit
Admission: RE | Admit: 2011-05-18 | Discharge: 2011-05-18 | Disposition: A | Discharge status: Home / Self Care | Payer: Medicare Other | Source: Ambulatory Visit | Attending: Internal Medicine | Admitting: Internal Medicine

## 2011-05-18 DIAGNOSIS — Z1231 Encounter for screening mammogram for malignant neoplasm of breast: Secondary | ICD-10-CM

## 2011-05-18 NOTE — Telephone Encounter (Signed)
Spoke with patient and discussed avoiding alcohol, caffeine, spicy or acid foods and nicotine. She also reports she is still having some diarrhea but she has not taken her Librax. She will take this and call back if it does not help.

## 2011-05-21 ENCOUNTER — Ambulatory Visit (INDEPENDENT_AMBULATORY_CARE_PROVIDER_SITE_OTHER): Payer: Medicare Other

## 2011-05-21 DIAGNOSIS — J309 Allergic rhinitis, unspecified: Secondary | ICD-10-CM

## 2011-05-28 ENCOUNTER — Ambulatory Visit (INDEPENDENT_AMBULATORY_CARE_PROVIDER_SITE_OTHER): Payer: Medicare Other

## 2011-05-28 DIAGNOSIS — J309 Allergic rhinitis, unspecified: Secondary | ICD-10-CM

## 2011-05-29 ENCOUNTER — Other Ambulatory Visit: Payer: Self-pay | Admitting: Internal Medicine

## 2011-06-03 ENCOUNTER — Ambulatory Visit (INDEPENDENT_AMBULATORY_CARE_PROVIDER_SITE_OTHER): Payer: Medicare Other

## 2011-06-03 DIAGNOSIS — J309 Allergic rhinitis, unspecified: Secondary | ICD-10-CM

## 2011-06-11 ENCOUNTER — Ambulatory Visit (INDEPENDENT_AMBULATORY_CARE_PROVIDER_SITE_OTHER): Payer: Medicare Other

## 2011-06-11 DIAGNOSIS — J309 Allergic rhinitis, unspecified: Secondary | ICD-10-CM

## 2011-06-18 ENCOUNTER — Ambulatory Visit (INDEPENDENT_AMBULATORY_CARE_PROVIDER_SITE_OTHER): Payer: Medicare Other

## 2011-06-18 DIAGNOSIS — J309 Allergic rhinitis, unspecified: Secondary | ICD-10-CM

## 2011-06-24 ENCOUNTER — Ambulatory Visit (INDEPENDENT_AMBULATORY_CARE_PROVIDER_SITE_OTHER): Payer: Medicare Other

## 2011-06-24 DIAGNOSIS — J309 Allergic rhinitis, unspecified: Secondary | ICD-10-CM

## 2011-07-02 ENCOUNTER — Ambulatory Visit (INDEPENDENT_AMBULATORY_CARE_PROVIDER_SITE_OTHER): Payer: Medicare Other

## 2011-07-02 DIAGNOSIS — J309 Allergic rhinitis, unspecified: Secondary | ICD-10-CM

## 2011-07-09 ENCOUNTER — Ambulatory Visit (INDEPENDENT_AMBULATORY_CARE_PROVIDER_SITE_OTHER): Payer: Medicare Other

## 2011-07-09 DIAGNOSIS — J309 Allergic rhinitis, unspecified: Secondary | ICD-10-CM

## 2011-07-16 ENCOUNTER — Ambulatory Visit (INDEPENDENT_AMBULATORY_CARE_PROVIDER_SITE_OTHER): Payer: Medicare Other

## 2011-07-16 DIAGNOSIS — J309 Allergic rhinitis, unspecified: Secondary | ICD-10-CM

## 2011-07-23 ENCOUNTER — Ambulatory Visit (INDEPENDENT_AMBULATORY_CARE_PROVIDER_SITE_OTHER): Payer: Medicare Other

## 2011-07-23 ENCOUNTER — Other Ambulatory Visit: Payer: Self-pay | Admitting: Internal Medicine

## 2011-07-23 DIAGNOSIS — J309 Allergic rhinitis, unspecified: Secondary | ICD-10-CM

## 2011-07-30 ENCOUNTER — Ambulatory Visit (INDEPENDENT_AMBULATORY_CARE_PROVIDER_SITE_OTHER): Payer: Medicare Other

## 2011-07-30 DIAGNOSIS — J309 Allergic rhinitis, unspecified: Secondary | ICD-10-CM

## 2011-08-06 ENCOUNTER — Ambulatory Visit (INDEPENDENT_AMBULATORY_CARE_PROVIDER_SITE_OTHER): Payer: Medicare Other

## 2011-08-06 DIAGNOSIS — J309 Allergic rhinitis, unspecified: Secondary | ICD-10-CM

## 2011-08-07 ENCOUNTER — Ambulatory Visit (INDEPENDENT_AMBULATORY_CARE_PROVIDER_SITE_OTHER): Payer: Medicare Other

## 2011-08-07 DIAGNOSIS — J309 Allergic rhinitis, unspecified: Secondary | ICD-10-CM

## 2011-08-14 ENCOUNTER — Ambulatory Visit (INDEPENDENT_AMBULATORY_CARE_PROVIDER_SITE_OTHER): Payer: Medicare Other

## 2011-08-14 DIAGNOSIS — J309 Allergic rhinitis, unspecified: Secondary | ICD-10-CM

## 2011-08-20 ENCOUNTER — Ambulatory Visit (INDEPENDENT_AMBULATORY_CARE_PROVIDER_SITE_OTHER): Payer: Medicare Other

## 2011-08-20 DIAGNOSIS — J309 Allergic rhinitis, unspecified: Secondary | ICD-10-CM

## 2011-08-24 ENCOUNTER — Encounter: Payer: Self-pay | Admitting: Internal Medicine

## 2011-08-27 ENCOUNTER — Ambulatory Visit (INDEPENDENT_AMBULATORY_CARE_PROVIDER_SITE_OTHER): Payer: Medicare Other

## 2011-08-27 DIAGNOSIS — J309 Allergic rhinitis, unspecified: Secondary | ICD-10-CM

## 2011-09-03 ENCOUNTER — Ambulatory Visit (INDEPENDENT_AMBULATORY_CARE_PROVIDER_SITE_OTHER): Payer: Medicare Other

## 2011-09-03 DIAGNOSIS — J309 Allergic rhinitis, unspecified: Secondary | ICD-10-CM

## 2011-09-10 ENCOUNTER — Ambulatory Visit (INDEPENDENT_AMBULATORY_CARE_PROVIDER_SITE_OTHER): Payer: Medicare Other

## 2011-09-10 DIAGNOSIS — J309 Allergic rhinitis, unspecified: Secondary | ICD-10-CM

## 2011-09-17 ENCOUNTER — Ambulatory Visit (INDEPENDENT_AMBULATORY_CARE_PROVIDER_SITE_OTHER): Payer: Medicare Other

## 2011-09-17 DIAGNOSIS — J309 Allergic rhinitis, unspecified: Secondary | ICD-10-CM

## 2011-09-24 ENCOUNTER — Ambulatory Visit (INDEPENDENT_AMBULATORY_CARE_PROVIDER_SITE_OTHER): Payer: Medicare Other

## 2011-09-24 DIAGNOSIS — J309 Allergic rhinitis, unspecified: Secondary | ICD-10-CM

## 2011-09-27 ENCOUNTER — Other Ambulatory Visit: Payer: Self-pay | Admitting: Cardiology

## 2011-09-30 ENCOUNTER — Ambulatory Visit (INDEPENDENT_AMBULATORY_CARE_PROVIDER_SITE_OTHER): Payer: Medicare Other

## 2011-09-30 DIAGNOSIS — J309 Allergic rhinitis, unspecified: Secondary | ICD-10-CM

## 2011-10-04 ENCOUNTER — Other Ambulatory Visit: Payer: Self-pay | Admitting: Internal Medicine

## 2011-10-09 ENCOUNTER — Ambulatory Visit (INDEPENDENT_AMBULATORY_CARE_PROVIDER_SITE_OTHER): Payer: Medicare Other

## 2011-10-09 DIAGNOSIS — J309 Allergic rhinitis, unspecified: Secondary | ICD-10-CM

## 2011-10-14 ENCOUNTER — Ambulatory Visit (INDEPENDENT_AMBULATORY_CARE_PROVIDER_SITE_OTHER): Payer: Medicare Other

## 2011-10-14 DIAGNOSIS — J309 Allergic rhinitis, unspecified: Secondary | ICD-10-CM

## 2011-10-22 ENCOUNTER — Ambulatory Visit (INDEPENDENT_AMBULATORY_CARE_PROVIDER_SITE_OTHER): Payer: Medicare Other

## 2011-10-22 DIAGNOSIS — J309 Allergic rhinitis, unspecified: Secondary | ICD-10-CM

## 2011-10-29 ENCOUNTER — Ambulatory Visit (INDEPENDENT_AMBULATORY_CARE_PROVIDER_SITE_OTHER): Payer: Medicare Other

## 2011-10-29 DIAGNOSIS — J309 Allergic rhinitis, unspecified: Secondary | ICD-10-CM

## 2011-11-06 ENCOUNTER — Ambulatory Visit: Payer: Medicare Other | Admitting: Internal Medicine

## 2011-11-10 ENCOUNTER — Ambulatory Visit: Payer: Medicare Other | Admitting: Internal Medicine

## 2011-11-12 ENCOUNTER — Ambulatory Visit (INDEPENDENT_AMBULATORY_CARE_PROVIDER_SITE_OTHER): Payer: Medicare Other

## 2011-11-12 DIAGNOSIS — J309 Allergic rhinitis, unspecified: Secondary | ICD-10-CM

## 2011-11-19 ENCOUNTER — Ambulatory Visit (INDEPENDENT_AMBULATORY_CARE_PROVIDER_SITE_OTHER): Payer: Medicare Other

## 2011-11-19 DIAGNOSIS — J309 Allergic rhinitis, unspecified: Secondary | ICD-10-CM

## 2011-11-24 ENCOUNTER — Ambulatory Visit (INDEPENDENT_AMBULATORY_CARE_PROVIDER_SITE_OTHER): Payer: Medicare Other

## 2011-11-24 DIAGNOSIS — J309 Allergic rhinitis, unspecified: Secondary | ICD-10-CM

## 2011-11-26 ENCOUNTER — Encounter: Payer: Self-pay | Admitting: Internal Medicine

## 2011-11-30 ENCOUNTER — Ambulatory Visit: Payer: Medicare Other | Admitting: Internal Medicine

## 2011-12-02 ENCOUNTER — Other Ambulatory Visit: Payer: Self-pay | Admitting: Internal Medicine

## 2011-12-06 ENCOUNTER — Other Ambulatory Visit: Payer: Self-pay | Admitting: Internal Medicine

## 2011-12-07 ENCOUNTER — Other Ambulatory Visit: Payer: Self-pay | Admitting: Internal Medicine

## 2011-12-15 ENCOUNTER — Encounter: Payer: Self-pay | Admitting: Internal Medicine

## 2011-12-15 ENCOUNTER — Ambulatory Visit (INDEPENDENT_AMBULATORY_CARE_PROVIDER_SITE_OTHER): Payer: Medicare Other | Admitting: Internal Medicine

## 2011-12-15 ENCOUNTER — Ambulatory Visit (INDEPENDENT_AMBULATORY_CARE_PROVIDER_SITE_OTHER): Payer: Medicare Other

## 2011-12-15 VITALS — BP 118/64 | HR 79 | Ht 59.0 in | Wt 123.2 lb

## 2011-12-15 DIAGNOSIS — J309 Allergic rhinitis, unspecified: Secondary | ICD-10-CM

## 2011-12-15 DIAGNOSIS — J301 Allergic rhinitis due to pollen: Secondary | ICD-10-CM

## 2011-12-15 NOTE — Progress Notes (Signed)
12/15/11- 79 yoFnever smoker followed for allergic rhinitis  PCP Dr Wylene Simmer LOV- 11/04/10 She feels she is doing well. For the past 2 days has had increased nasal congestion and discharge has a few blood streaks. Throat tickle from postnasal drip. Denies sore throat, fever, history of asthma. Continues allergy vaccine 1:10 here without problems. Taking Zyrtec once or twice a week.  ROS-see HPI Constitutional:   No-   weight loss, night sweats, fevers, chills, fatigue, lassitude. HEENT:   No-  headaches, difficulty swallowing, tooth/dental problems, sore throat,       + sneezing, itching, ear ache, nasal congestion, post nasal drip,  CV:  No-   chest pain, orthopnea, PND, swelling in lower extremities, anasarca,dizziness, palpitations Resp: No-   shortness of breath with exertion or at rest.              No-   productive cough,  No non-productive cough,  No- coughing up of blood.              No-   change in color of mucus.  No- wheezing.   Skin: No-   rash or lesions. GI:  No-   heartburn, indigestion, abdominal pain, nausea, vomiting,  GU:  MS:  No-   joint pain or swelling.  Neuro-     nothing unusual Psych:  No- change in mood or affect. No depression or anxiety.  No memory loss.  OBJ- Physical Exam General- Alert, Oriented, Affect-appropriate, Distress- none acute Skin- rash-none, lesions- none, excoriation- none Lymphadenopathy- none Head- atraumatic            Eyes- Gross vision intact, PERRLA, conjunctivae and secretions clear            Ears- Hearing, canals-normal            Nose- Small  Blood crust lateral R nostril, no-Septal dev, mucus, polyps, erosion, perforation             Throat- Mallampati III-IV , mucosa clear , drainage- none, tonsils- atrophic Neck- flexible , trachea midline, no stridor , thyroid nl, carotid no bruit Chest - symmetrical excursion , unlabored           Heart/CV- RRR, occasional skip , no murmur , no gallop  , no rub, nl s1 s2   - JVD- none , edema- none, stasis changes- none, varices- none           Lung- clear to P&A, wheeze- none, cough- none , dullness-none, rub- none           Chest wall-  Abd-  Br/ Gen/ Rectal- Not done, not indicated Extrem- cyanosis- none, clubbing, none, atrophy- none, strength- nl Neuro- grossly intact to observation

## 2011-12-15 NOTE — Patient Instructions (Signed)
Continue allergy vaccine  Consider trying the saline nasal gel to help heal small irritated areas in your nose when needed  Please call as needed

## 2011-12-18 ENCOUNTER — Other Ambulatory Visit: Payer: Self-pay | Admitting: Cardiology

## 2011-12-19 NOTE — Assessment & Plan Note (Signed)
In generally good control with allergy vaccine. She feels it is worth continuing. Supplemental antihistamine occasionally appropriate.

## 2011-12-24 ENCOUNTER — Ambulatory Visit (INDEPENDENT_AMBULATORY_CARE_PROVIDER_SITE_OTHER): Payer: Medicare Other

## 2011-12-24 DIAGNOSIS — J309 Allergic rhinitis, unspecified: Secondary | ICD-10-CM

## 2011-12-30 ENCOUNTER — Ambulatory Visit (INDEPENDENT_AMBULATORY_CARE_PROVIDER_SITE_OTHER): Payer: Medicare Other

## 2011-12-30 DIAGNOSIS — J309 Allergic rhinitis, unspecified: Secondary | ICD-10-CM

## 2012-01-02 ENCOUNTER — Other Ambulatory Visit: Payer: Self-pay | Admitting: Internal Medicine

## 2012-01-06 ENCOUNTER — Ambulatory Visit (INDEPENDENT_AMBULATORY_CARE_PROVIDER_SITE_OTHER): Payer: Medicare Other

## 2012-01-06 DIAGNOSIS — J309 Allergic rhinitis, unspecified: Secondary | ICD-10-CM

## 2012-01-13 ENCOUNTER — Ambulatory Visit (INDEPENDENT_AMBULATORY_CARE_PROVIDER_SITE_OTHER): Payer: Medicare Other

## 2012-01-13 DIAGNOSIS — J309 Allergic rhinitis, unspecified: Secondary | ICD-10-CM

## 2012-01-19 ENCOUNTER — Other Ambulatory Visit: Payer: Self-pay | Admitting: Cardiology

## 2012-01-21 ENCOUNTER — Ambulatory Visit (INDEPENDENT_AMBULATORY_CARE_PROVIDER_SITE_OTHER): Payer: Medicare Other

## 2012-01-21 DIAGNOSIS — J309 Allergic rhinitis, unspecified: Secondary | ICD-10-CM

## 2012-01-27 ENCOUNTER — Ambulatory Visit (INDEPENDENT_AMBULATORY_CARE_PROVIDER_SITE_OTHER): Payer: Medicare Other | Admitting: Cardiology

## 2012-01-27 ENCOUNTER — Encounter: Payer: Self-pay | Admitting: Cardiology

## 2012-01-27 VITALS — BP 112/58 | HR 82 | Ht 59.0 in | Wt 121.0 lb

## 2012-01-27 DIAGNOSIS — E785 Hyperlipidemia, unspecified: Secondary | ICD-10-CM

## 2012-01-27 DIAGNOSIS — I779 Disorder of arteries and arterioles, unspecified: Secondary | ICD-10-CM

## 2012-01-27 DIAGNOSIS — I10 Essential (primary) hypertension: Secondary | ICD-10-CM

## 2012-01-27 DIAGNOSIS — I739 Peripheral vascular disease, unspecified: Secondary | ICD-10-CM

## 2012-01-27 DIAGNOSIS — I4949 Other premature depolarization: Secondary | ICD-10-CM

## 2012-01-27 NOTE — Patient Instructions (Signed)
Your physician recommends that you continue on your current medications as directed. Please refer to the Current Medication list given to you today.   Your physician wants you to follow-up in: 1 year with Dr. Wall. You will receive a reminder letter in the mail two months in advance. If you don't receive a letter, please call our office to schedule the follow-up appointment.  

## 2012-01-27 NOTE — Progress Notes (Signed)
HPI Mrs Victoria Joseph returns today for evaluation of her PVCs, palpitations, mild mitral regurgitation, and PAD.  She is having very few if any palpitations. She denies any chest pain or angina. She is having no symptoms of presyncope or dizziness.  Her blood work is followed by primary care.  She has a history of PAD diagnosed by Dr. Madilyn Fireman back in early 2000. I cannot find the ultrasound report. She has minimal claudication. Other than hypertension and hyperlipidemia, she has no other conventional risk factors. She's had no ulcerations or sores. She has never smoked and is not diabetic.  Past Medical History  Diagnosis Date  . Hypertension   . Hyperlipidemia   . URI (upper respiratory infection)   . Pneumonia 2009  . Rhinitis, allergic   . Osteoporosis   . Osteoarthritis   . PVD (peripheral vascular disease)   . GERD (gastroesophageal reflux disease)   . Diverticulosis   . IBS (irritable bowel syndrome)   . Anxiety   . Sinusitis   . Heart murmur   . Arrhythmia     PVCs    Current Outpatient Prescriptions  Medication Sig Dispense Refill  . ALPRAZolam (XANAX) 0.5 MG tablet Take 0.5 mg by mouth 2 (two) times daily.       Marland Kitchen amitriptyline (ELAVIL) 25 MG tablet Take 25 mg by mouth daily.        Marland Kitchen aspirin 81 MG EC tablet Take 81 mg by mouth daily.        Marland Kitchen atenolol (TENORMIN) 50 MG tablet take 1 tablet by mouth twice a day  60 tablet  6  . calcium citrate-vitamin D 200-200 MG-UNIT TABS Take 1 tablet by mouth 2 (two) times daily.       . cetirizine (ZYRTEC) 10 MG tablet Take 10 mg by mouth. Daily as needed       . chlorthalidone (HYGROTON) 25 MG tablet TAKE 1 TABLET BY MOUTH ONCE DAILY  30 tablet  3  . Cholecalciferol (VITAMIN D3) 1000 UNITS CAPS Take 1 tablet by mouth daily.        . cilostazol (PLETAL) 100 MG tablet Take 100 mg by mouth 2 (two) times daily.       . clidinium-chlordiazePOXIDE (LIBRAX) 2.5-5 MG per capsule take 1 capsule by mouth before meals if needed  30 capsule  2  .  ESTRACE VAGINAL 0.1 MG/GM vaginal cream       . KLOR-CON M20 20 MEQ tablet take 1 tablet by mouth once daily  30 tablet  11  . Multiple Vitamin (MULTIVITAMIN) tablet Take 1 tablet by mouth daily.        . pantoprazole (PROTONIX) 40 MG tablet take 1 tablet by mouth daily  30 tablet  6  . pravastatin (PRAVACHOL) 40 MG tablet Take 40 mg by mouth daily.        . zoledronic acid (RECLAST) 5 MG/100ML SOLN Inject 5 mg into the vein. Once per year.       . AMBULATORY NON FORMULARY MEDICATION Allergy injections once a week       . DISCONTD: famotidine (PEPCID) 20 MG tablet Take 20 mg by mouth daily.        Marland Kitchen DISCONTD: KLOR-CON M20 20 MEQ tablet       . DISCONTD: Potassium Chloride (KLOR-CON 10 PO) Take 10 mEq by mouth daily.          Allergies  Allergen Reactions  . Penicillins Hives    Family History  Problem Relation Age of  Onset  . Heart failure Mother   . Asthma Paternal Grandfather     History   Social History  . Marital Status: Widowed    Spouse Name: N/A    Number of Children: 2  . Years of Education: N/A   Occupational History  . retired    Social History Main Topics  . Smoking status: Never Smoker   . Smokeless tobacco: Never Used   Comment: has exposure to 2nd hand smoke  . Alcohol Use: No  . Drug Use: No  . Sexually Active: Not on file   Other Topics Concern  . Not on file   Social History Narrative  . No narrative on file    ROS ALL NEGATIVE EXCEPT THOSE NOTED IN HPI  PE  General Appearance: well developed, well nourished in no acute distress HEENT: symmetrical face, PERRLA, good dentition  Neck: no JVD, thyromegaly, or adenopathy, trachea midline Chest: symmetric without deformity Cardiac: PMI non-displaced, RRR, normal S1, S2, no gallop or murmur Lung: clear to ausculation and percussion Vascular: all pulses full without bruits  Abdominal: nondistended, nontender, good bowel sounds, no HSM, no bruits Extremities: Dependent rubor, feet are warm,  decreased capillary refill, pulses are nonpalpable. There no sores or blisters. Skin: normal color, no rashes Neuro: alert and oriented x 3, non-focal Pysch: normal affect  EKG Normal sinus rhythm, nonspecific ST segment changes, no change since previous ECG. BMET    Component Value Date/Time   NA 137 12/14/2009 1738   K 3.5 12/14/2009 1738   CL 102 12/14/2009 1738   CO2 26 12/14/2009 1738   GLUCOSE 109* 12/14/2009 1738   BUN 19 12/14/2009 1738   CREATININE 0.78 12/14/2009 1738   CALCIUM 9.6 12/14/2009 1738   GFRNONAA >60 12/14/2009 1738   GFRAA  Value: >60        The eGFR has been calculated using the MDRD equation. This calculation has not been validated in all clinical situations. eGFR's persistently <60 mL/min signify possible Chronic Kidney Disease. 12/14/2009 1738    Lipid Panel  No results found for this basename: chol, trig, hdl, cholhdl, vldl, ldlcalc    CBC    Component Value Date/Time   WBC 13.9* 12/14/2009 1738   RBC 4.44 12/14/2009 1738   HGB 13.4 12/14/2009 1738   HCT 39.5 12/14/2009 1738   PLT 328 12/14/2009 1738   MCV 88.9 12/14/2009 1738   MCHC 33.8 12/14/2009 1738   RDW 13.3 12/14/2009 1738   LYMPHSABS 1.9 12/14/2009 1738   MONOABS 0.5 12/14/2009 1738   EOSABS 0.3 12/14/2009 1738   BASOSABS 0.0 12/14/2009 1738

## 2012-01-27 NOTE — Assessment & Plan Note (Signed)
Very few palpitations on her beta blocker. No change in treatment.

## 2012-01-27 NOTE — Assessment & Plan Note (Signed)
Her claudication has gotten better through the years. She has done well on secondary preventative therapy and Pletal. No changes will be made based on any diagnostic studies so we'll not obtained. She knows to be careful with her feet and trimming her nails. We will see her back in 2 years or when necessary.

## 2012-01-28 ENCOUNTER — Ambulatory Visit (INDEPENDENT_AMBULATORY_CARE_PROVIDER_SITE_OTHER): Payer: Medicare Other

## 2012-01-28 DIAGNOSIS — J309 Allergic rhinitis, unspecified: Secondary | ICD-10-CM

## 2012-02-02 ENCOUNTER — Encounter (HOSPITAL_COMMUNITY): Payer: Self-pay

## 2012-02-02 ENCOUNTER — Encounter (HOSPITAL_COMMUNITY)
Admission: RE | Admit: 2012-02-02 | Discharge: 2012-02-02 | Disposition: A | Discharge status: Home / Self Care | Payer: Medicare Other | Source: Ambulatory Visit | Attending: Internal Medicine | Admitting: Internal Medicine

## 2012-02-02 DIAGNOSIS — M810 Age-related osteoporosis without current pathological fracture: Secondary | ICD-10-CM | POA: Insufficient documentation

## 2012-02-02 MED ORDER — ZOLEDRONIC ACID 5 MG/100ML IV SOLN
5.0000 mg | Freq: Once | INTRAVENOUS | Status: AC
  Administered 2012-02-02: 5 mg via INTRAVENOUS
  Filled 2012-02-02: qty 100

## 2012-02-02 MED ORDER — SODIUM CHLORIDE 0.9 % IV SOLN
INTRAVENOUS | Status: AC
  Administered 2012-02-02: 250 mL via INTRAVENOUS

## 2012-02-02 NOTE — Discharge Instructions (Signed)
Drink fluids/water as tolerated over next 72hrs Tylenol or Ibuprofen OTC as directed Continue calcium and Vit D as directed by your MD 

## 2012-02-03 ENCOUNTER — Other Ambulatory Visit: Payer: Self-pay | Admitting: Internal Medicine

## 2012-02-04 ENCOUNTER — Ambulatory Visit (INDEPENDENT_AMBULATORY_CARE_PROVIDER_SITE_OTHER): Payer: Medicare Other

## 2012-02-04 DIAGNOSIS — J309 Allergic rhinitis, unspecified: Secondary | ICD-10-CM

## 2012-02-09 ENCOUNTER — Ambulatory Visit (INDEPENDENT_AMBULATORY_CARE_PROVIDER_SITE_OTHER): Payer: Medicare Other

## 2012-02-09 DIAGNOSIS — J309 Allergic rhinitis, unspecified: Secondary | ICD-10-CM

## 2012-02-25 ENCOUNTER — Ambulatory Visit (INDEPENDENT_AMBULATORY_CARE_PROVIDER_SITE_OTHER): Payer: Medicare Other

## 2012-02-25 DIAGNOSIS — J309 Allergic rhinitis, unspecified: Secondary | ICD-10-CM

## 2012-03-04 ENCOUNTER — Ambulatory Visit (INDEPENDENT_AMBULATORY_CARE_PROVIDER_SITE_OTHER): Payer: Medicare Other

## 2012-03-04 DIAGNOSIS — J309 Allergic rhinitis, unspecified: Secondary | ICD-10-CM

## 2012-03-10 ENCOUNTER — Ambulatory Visit (INDEPENDENT_AMBULATORY_CARE_PROVIDER_SITE_OTHER): Payer: Medicare Other

## 2012-03-10 DIAGNOSIS — J309 Allergic rhinitis, unspecified: Secondary | ICD-10-CM

## 2012-03-13 ENCOUNTER — Other Ambulatory Visit: Payer: Self-pay | Admitting: Internal Medicine

## 2012-03-17 ENCOUNTER — Ambulatory Visit (INDEPENDENT_AMBULATORY_CARE_PROVIDER_SITE_OTHER): Payer: Medicare Other

## 2012-03-17 DIAGNOSIS — J309 Allergic rhinitis, unspecified: Secondary | ICD-10-CM

## 2012-03-22 ENCOUNTER — Ambulatory Visit (INDEPENDENT_AMBULATORY_CARE_PROVIDER_SITE_OTHER): Payer: Medicare Other

## 2012-03-22 DIAGNOSIS — J309 Allergic rhinitis, unspecified: Secondary | ICD-10-CM

## 2012-04-06 ENCOUNTER — Encounter: Payer: Self-pay | Admitting: Internal Medicine

## 2012-04-06 ENCOUNTER — Ambulatory Visit (INDEPENDENT_AMBULATORY_CARE_PROVIDER_SITE_OTHER): Payer: Medicare Other

## 2012-04-06 DIAGNOSIS — J309 Allergic rhinitis, unspecified: Secondary | ICD-10-CM

## 2012-04-13 ENCOUNTER — Ambulatory Visit (INDEPENDENT_AMBULATORY_CARE_PROVIDER_SITE_OTHER): Payer: Medicare Other

## 2012-04-13 DIAGNOSIS — J309 Allergic rhinitis, unspecified: Secondary | ICD-10-CM

## 2012-04-22 ENCOUNTER — Ambulatory Visit (INDEPENDENT_AMBULATORY_CARE_PROVIDER_SITE_OTHER): Payer: Medicare Other

## 2012-04-22 DIAGNOSIS — J309 Allergic rhinitis, unspecified: Secondary | ICD-10-CM

## 2012-04-29 ENCOUNTER — Other Ambulatory Visit: Payer: Self-pay | Admitting: Cardiology

## 2012-05-04 ENCOUNTER — Ambulatory Visit (INDEPENDENT_AMBULATORY_CARE_PROVIDER_SITE_OTHER): Payer: Medicare Other

## 2012-05-04 DIAGNOSIS — J309 Allergic rhinitis, unspecified: Secondary | ICD-10-CM

## 2012-05-05 ENCOUNTER — Other Ambulatory Visit: Payer: Self-pay | Admitting: Internal Medicine

## 2012-05-11 ENCOUNTER — Ambulatory Visit (INDEPENDENT_AMBULATORY_CARE_PROVIDER_SITE_OTHER): Payer: Medicare Other

## 2012-05-11 DIAGNOSIS — J309 Allergic rhinitis, unspecified: Secondary | ICD-10-CM

## 2012-05-18 ENCOUNTER — Ambulatory Visit (INDEPENDENT_AMBULATORY_CARE_PROVIDER_SITE_OTHER): Payer: Medicare Other

## 2012-05-18 DIAGNOSIS — J309 Allergic rhinitis, unspecified: Secondary | ICD-10-CM

## 2012-05-29 ENCOUNTER — Other Ambulatory Visit: Payer: Self-pay | Admitting: Cardiology

## 2012-05-30 ENCOUNTER — Ambulatory Visit (INDEPENDENT_AMBULATORY_CARE_PROVIDER_SITE_OTHER): Payer: Medicare Other

## 2012-05-30 DIAGNOSIS — J309 Allergic rhinitis, unspecified: Secondary | ICD-10-CM

## 2012-06-01 ENCOUNTER — Ambulatory Visit (INDEPENDENT_AMBULATORY_CARE_PROVIDER_SITE_OTHER): Payer: Medicare Other

## 2012-06-01 DIAGNOSIS — J309 Allergic rhinitis, unspecified: Secondary | ICD-10-CM

## 2012-06-06 ENCOUNTER — Other Ambulatory Visit: Payer: Self-pay | Admitting: Internal Medicine

## 2012-06-06 DIAGNOSIS — Z1231 Encounter for screening mammogram for malignant neoplasm of breast: Secondary | ICD-10-CM

## 2012-06-08 ENCOUNTER — Other Ambulatory Visit: Payer: Self-pay | Admitting: Dermatology

## 2012-06-08 ENCOUNTER — Ambulatory Visit (INDEPENDENT_AMBULATORY_CARE_PROVIDER_SITE_OTHER): Payer: Medicare Other

## 2012-06-08 DIAGNOSIS — J309 Allergic rhinitis, unspecified: Secondary | ICD-10-CM

## 2012-06-09 ENCOUNTER — Other Ambulatory Visit: Payer: Self-pay | Admitting: Internal Medicine

## 2012-06-10 ENCOUNTER — Other Ambulatory Visit: Payer: Self-pay

## 2012-06-10 MED ORDER — CILIDINIUM-CHLORDIAZEPOXIDE 2.5-5 MG PO CAPS: 1.0000 | ORAL_CAPSULE | Freq: Three times a day (TID) | ORAL | Status: DC | PRN

## 2012-06-16 ENCOUNTER — Ambulatory Visit (INDEPENDENT_AMBULATORY_CARE_PROVIDER_SITE_OTHER): Payer: Medicare Other

## 2012-06-16 DIAGNOSIS — J309 Allergic rhinitis, unspecified: Secondary | ICD-10-CM

## 2012-06-22 ENCOUNTER — Ambulatory Visit (INDEPENDENT_AMBULATORY_CARE_PROVIDER_SITE_OTHER): Payer: Medicare Other

## 2012-06-22 DIAGNOSIS — J309 Allergic rhinitis, unspecified: Secondary | ICD-10-CM

## 2012-06-26 ENCOUNTER — Other Ambulatory Visit: Payer: Self-pay | Admitting: Internal Medicine

## 2012-06-27 ENCOUNTER — Encounter: Payer: Self-pay | Admitting: Internal Medicine

## 2012-07-01 ENCOUNTER — Ambulatory Visit (INDEPENDENT_AMBULATORY_CARE_PROVIDER_SITE_OTHER): Payer: Medicare Other

## 2012-07-01 DIAGNOSIS — J309 Allergic rhinitis, unspecified: Secondary | ICD-10-CM

## 2012-07-04 ENCOUNTER — Ambulatory Visit
Admission: RE | Admit: 2012-07-04 | Discharge: 2012-07-04 | Disposition: A | Discharge status: Home / Self Care | Payer: Medicare Other | Source: Ambulatory Visit | Attending: Internal Medicine | Admitting: Internal Medicine

## 2012-07-04 DIAGNOSIS — Z1231 Encounter for screening mammogram for malignant neoplasm of breast: Secondary | ICD-10-CM

## 2012-07-13 ENCOUNTER — Ambulatory Visit (INDEPENDENT_AMBULATORY_CARE_PROVIDER_SITE_OTHER): Payer: Medicare Other

## 2012-07-13 DIAGNOSIS — J309 Allergic rhinitis, unspecified: Secondary | ICD-10-CM

## 2012-07-21 ENCOUNTER — Ambulatory Visit (INDEPENDENT_AMBULATORY_CARE_PROVIDER_SITE_OTHER): Payer: Medicare Other

## 2012-07-21 DIAGNOSIS — J309 Allergic rhinitis, unspecified: Secondary | ICD-10-CM

## 2012-07-27 ENCOUNTER — Ambulatory Visit (INDEPENDENT_AMBULATORY_CARE_PROVIDER_SITE_OTHER): Payer: Medicare Other

## 2012-07-27 DIAGNOSIS — J309 Allergic rhinitis, unspecified: Secondary | ICD-10-CM

## 2012-08-10 ENCOUNTER — Ambulatory Visit (INDEPENDENT_AMBULATORY_CARE_PROVIDER_SITE_OTHER): Payer: Medicare Other

## 2012-08-10 DIAGNOSIS — J309 Allergic rhinitis, unspecified: Secondary | ICD-10-CM

## 2012-08-17 ENCOUNTER — Ambulatory Visit (INDEPENDENT_AMBULATORY_CARE_PROVIDER_SITE_OTHER): Payer: Medicare Other

## 2012-08-17 DIAGNOSIS — J309 Allergic rhinitis, unspecified: Secondary | ICD-10-CM

## 2012-08-26 ENCOUNTER — Ambulatory Visit (INDEPENDENT_AMBULATORY_CARE_PROVIDER_SITE_OTHER): Payer: Medicare Other

## 2012-08-26 DIAGNOSIS — J309 Allergic rhinitis, unspecified: Secondary | ICD-10-CM

## 2012-08-31 ENCOUNTER — Ambulatory Visit (INDEPENDENT_AMBULATORY_CARE_PROVIDER_SITE_OTHER): Payer: Medicare Other

## 2012-08-31 DIAGNOSIS — J309 Allergic rhinitis, unspecified: Secondary | ICD-10-CM

## 2012-09-07 ENCOUNTER — Ambulatory Visit (INDEPENDENT_AMBULATORY_CARE_PROVIDER_SITE_OTHER): Payer: Medicare Other

## 2012-09-07 DIAGNOSIS — J309 Allergic rhinitis, unspecified: Secondary | ICD-10-CM

## 2012-09-13 ENCOUNTER — Telehealth: Payer: Self-pay

## 2012-09-13 NOTE — Telephone Encounter (Signed)
Faxed refill request back to [facility] at [phone] denying genenic Librax, needs office visit.

## 2012-09-19 ENCOUNTER — Ambulatory Visit (INDEPENDENT_AMBULATORY_CARE_PROVIDER_SITE_OTHER): Payer: Medicare Other

## 2012-09-19 DIAGNOSIS — J309 Allergic rhinitis, unspecified: Secondary | ICD-10-CM

## 2012-09-22 ENCOUNTER — Other Ambulatory Visit: Payer: Self-pay | Admitting: *Deleted

## 2012-09-22 MED ORDER — CHLORTHALIDONE 25 MG PO TABS: 25.0000 mg | ORAL_TABLET | Freq: Every day | ORAL | Status: DC

## 2012-09-28 ENCOUNTER — Ambulatory Visit (INDEPENDENT_AMBULATORY_CARE_PROVIDER_SITE_OTHER): Payer: Medicare Other

## 2012-09-28 DIAGNOSIS — J309 Allergic rhinitis, unspecified: Secondary | ICD-10-CM

## 2012-10-05 ENCOUNTER — Ambulatory Visit (INDEPENDENT_AMBULATORY_CARE_PROVIDER_SITE_OTHER): Payer: Medicare Other

## 2012-10-05 DIAGNOSIS — J309 Allergic rhinitis, unspecified: Secondary | ICD-10-CM

## 2012-10-12 ENCOUNTER — Ambulatory Visit: Payer: Medicare Other

## 2012-10-14 ENCOUNTER — Encounter: Payer: Self-pay | Admitting: Internal Medicine

## 2012-10-14 ENCOUNTER — Ambulatory Visit (INDEPENDENT_AMBULATORY_CARE_PROVIDER_SITE_OTHER): Payer: Medicare Other

## 2012-10-14 ENCOUNTER — Ambulatory Visit (INDEPENDENT_AMBULATORY_CARE_PROVIDER_SITE_OTHER): Payer: Medicare Other | Admitting: Internal Medicine

## 2012-10-14 VITALS — BP 118/74 | HR 67 | Ht 59.0 in | Wt 121.2 lb

## 2012-10-14 DIAGNOSIS — J309 Allergic rhinitis, unspecified: Secondary | ICD-10-CM

## 2012-10-14 DIAGNOSIS — J301 Allergic rhinitis due to pollen: Secondary | ICD-10-CM

## 2012-10-14 MED ORDER — AZELASTINE-FLUTICASONE 137-50 MCG/ACT NA SUSP: 1.0000 | Freq: Every day | NASAL | Status: DC

## 2012-10-14 NOTE — Patient Instructions (Addendum)
We can continue allergy vaccine 1:10  Ok to continue antihistamine allegra as needed  Sample and script to try Dymista nasal spray   1-2 puffs each nostril once daily at bedtime

## 2012-10-14 NOTE — Progress Notes (Signed)
12/15/11- 79 yoFnever smoker followed for allergic rhinitis  PCP Dr Wylene Simmer LOV- 11/04/10 She feels she is doing well. For the past 2 days has had increased nasal congestion and discharge has a few blood streaks. Throat tickle from postnasal drip. Denies sore throat, fever, history of asthma. Continues allergy vaccine 1:10 here without problems. Taking Zyrtec once or twice a week.  10/15/11- 79 yoFnever smoker followed for allergic rhinitis  PCP Dr Wylene Simmer FOLLOWS FOR: runny nose; PND in throat. Allergy vaccine 1:10 GH continues to be helpful. Some rhinorrhea, watery clear. She changed Zyrtec to [employer].  ROS-see HPI Constitutional:   No-   weight loss, night sweats, fevers, chills, fatigue, lassitude. HEENT:   No-  headaches, difficulty swallowing, tooth/dental problems, sore throat,       + sneezing, itching, ear ache, +nasal congestion, +post nasal drip,  CV:  No-   chest pain, orthopnea, PND, swelling in lower extremities, anasarca,dizziness, palpitations Resp: No-   shortness of breath with exertion or at rest.              No-   productive cough,  No non-productive cough,  No- coughing up of blood.              No-   change in color of mucus.  No- wheezing.   Skin: No-   rash or lesions. GI:  No-   heartburn, indigestion, abdominal pain, nausea, vomiting,  GU:  MS:  No-   joint pain or swelling.  Neuro-     nothing unusual Psych:  No- change in mood or affect. No depression or anxiety.  No memory loss.  OBJ- Physical Exam General- Alert, Oriented, Affect-appropriate, Distress- none acute Skin- rash-none, lesions- none, excoriation- none Lymphadenopathy- none Head- atraumatic            Eyes- Gross vision intact, PERRLA, conjunctivae and secretions clear            Ears- Hearing, canals-normal            Nose- + watery, no-Septal dev,  polyps, erosion, perforation             Throat- Mallampati III-IV , mucosa clear , drainage- none, tonsils- atrophic Neck- flexible , trachea  midline, no stridor , thyroid nl, carotid no bruit Chest - symmetrical excursion , unlabored           Heart/CV- RRR, occasional skip , no murmur , no gallop  , no rub, nl s1 s2                           - JVD- none , edema- none, stasis changes- none, varices- none           Lung- clear to P&A, wheeze- none, cough- none , dullness-none, rub- none           Chest wall-  Abd-  Br/ Gen/ Rectal- Not done, not indicated Extrem- cyanosis- none, clubbing, none, atrophy- none, strength- nl Neuro- grossly intact to observation

## 2012-10-19 ENCOUNTER — Other Ambulatory Visit: Payer: Self-pay | Admitting: *Deleted

## 2012-10-19 ENCOUNTER — Telehealth: Payer: Self-pay | Admitting: Internal Medicine

## 2012-10-19 ENCOUNTER — Ambulatory Visit: Payer: Medicare Other

## 2012-10-19 MED ORDER — PANTOPRAZOLE SODIUM 40 MG PO TBEC: 40.0000 mg | DELAYED_RELEASE_TABLET | Freq: Every day | ORAL | Status: DC

## 2012-10-19 MED ORDER — CILIDINIUM-CHLORDIAZEPOXIDE 2.5-5 MG PO CAPS: 1.0000 | ORAL_CAPSULE | Freq: Three times a day (TID) | ORAL | Status: DC | PRN

## 2012-10-19 NOTE — Telephone Encounter (Signed)
rx sent

## 2012-10-21 ENCOUNTER — Ambulatory Visit (INDEPENDENT_AMBULATORY_CARE_PROVIDER_SITE_OTHER): Payer: Medicare Other

## 2012-10-21 ENCOUNTER — Telehealth: Payer: Self-pay | Admitting: Internal Medicine

## 2012-10-21 DIAGNOSIS — J309 Allergic rhinitis, unspecified: Secondary | ICD-10-CM

## 2012-10-21 MED ORDER — AZELASTINE HCL 0.1 % NA SOLN: NASAL | Status: DC

## 2012-10-21 MED ORDER — FLUTICASONE PROPIONATE 50 MCG/ACT NA SUSP: NASAL | Status: DC

## 2012-10-21 NOTE — Telephone Encounter (Signed)
Called for PA.  Member ID #[id number].  Dymista is non-formulary on the pt's insurance but the Azelastine and Fluticasone are covered if prescribed separately. I spoke with the pt and she said she would be fine with the two separate prescriptions. These have been sent to her pharmacy.

## 2012-10-21 NOTE — Telephone Encounter (Signed)
Patient calling checking of status of dymista.  Also, calling to give [place] telephone # [phone] for approval.

## 2012-10-24 ENCOUNTER — Ambulatory Visit (INDEPENDENT_AMBULATORY_CARE_PROVIDER_SITE_OTHER): Payer: Medicare Other

## 2012-10-24 DIAGNOSIS — J309 Allergic rhinitis, unspecified: Secondary | ICD-10-CM

## 2012-10-24 NOTE — Assessment & Plan Note (Signed)
Okay to continue allergy vaccine. Discussed antihistamines. Plan-sample Dymista nasal spray for trial with discussion

## 2012-10-27 ENCOUNTER — Ambulatory Visit (INDEPENDENT_AMBULATORY_CARE_PROVIDER_SITE_OTHER): Payer: Medicare Other

## 2012-10-27 DIAGNOSIS — J309 Allergic rhinitis, unspecified: Secondary | ICD-10-CM

## 2012-11-02 ENCOUNTER — Encounter: Payer: Self-pay | Admitting: Internal Medicine

## 2012-11-08 ENCOUNTER — Ambulatory Visit (INDEPENDENT_AMBULATORY_CARE_PROVIDER_SITE_OTHER): Payer: Medicare Other

## 2012-11-08 DIAGNOSIS — J309 Allergic rhinitis, unspecified: Secondary | ICD-10-CM

## 2012-11-16 ENCOUNTER — Ambulatory Visit (INDEPENDENT_AMBULATORY_CARE_PROVIDER_SITE_OTHER): Payer: Medicare Other

## 2012-11-16 DIAGNOSIS — J309 Allergic rhinitis, unspecified: Secondary | ICD-10-CM

## 2012-11-23 ENCOUNTER — Ambulatory Visit (INDEPENDENT_AMBULATORY_CARE_PROVIDER_SITE_OTHER): Payer: Medicare Other

## 2012-11-23 DIAGNOSIS — J309 Allergic rhinitis, unspecified: Secondary | ICD-10-CM

## 2012-11-24 ENCOUNTER — Other Ambulatory Visit: Payer: Self-pay | Admitting: *Deleted

## 2012-11-24 MED ORDER — ATENOLOL 50 MG PO TABS: 50.0000 mg | ORAL_TABLET | Freq: Two times a day (BID) | ORAL | Status: DC

## 2012-11-30 ENCOUNTER — Ambulatory Visit (INDEPENDENT_AMBULATORY_CARE_PROVIDER_SITE_OTHER): Payer: Medicare Other

## 2012-11-30 DIAGNOSIS — J309 Allergic rhinitis, unspecified: Secondary | ICD-10-CM

## 2012-12-06 ENCOUNTER — Encounter: Payer: Self-pay | Admitting: *Deleted

## 2012-12-07 ENCOUNTER — Ambulatory Visit: Payer: Medicare Other

## 2012-12-07 ENCOUNTER — Ambulatory Visit: Payer: Medicare Other | Admitting: Internal Medicine

## 2012-12-08 ENCOUNTER — Ambulatory Visit (INDEPENDENT_AMBULATORY_CARE_PROVIDER_SITE_OTHER): Payer: Medicare Other

## 2012-12-08 DIAGNOSIS — J309 Allergic rhinitis, unspecified: Secondary | ICD-10-CM

## 2012-12-14 ENCOUNTER — Other Ambulatory Visit: Payer: Self-pay | Admitting: *Deleted

## 2012-12-14 ENCOUNTER — Ambulatory Visit (INDEPENDENT_AMBULATORY_CARE_PROVIDER_SITE_OTHER): Payer: Medicare Other

## 2012-12-14 DIAGNOSIS — J309 Allergic rhinitis, unspecified: Secondary | ICD-10-CM

## 2012-12-14 MED ORDER — POTASSIUM CHLORIDE CRYS ER 20 MEQ PO TBCR: EXTENDED_RELEASE_TABLET | ORAL | Status: DC

## 2012-12-19 ENCOUNTER — Ambulatory Visit: Payer: Medicare Other | Admitting: Internal Medicine

## 2012-12-21 ENCOUNTER — Ambulatory Visit (INDEPENDENT_AMBULATORY_CARE_PROVIDER_SITE_OTHER): Payer: Medicare Other

## 2012-12-21 DIAGNOSIS — J309 Allergic rhinitis, unspecified: Secondary | ICD-10-CM

## 2012-12-28 ENCOUNTER — Ambulatory Visit: Payer: Medicare Other

## 2013-01-04 ENCOUNTER — Ambulatory Visit (INDEPENDENT_AMBULATORY_CARE_PROVIDER_SITE_OTHER): Payer: Medicare Other

## 2013-01-04 DIAGNOSIS — J309 Allergic rhinitis, unspecified: Secondary | ICD-10-CM

## 2013-01-11 ENCOUNTER — Ambulatory Visit (INDEPENDENT_AMBULATORY_CARE_PROVIDER_SITE_OTHER): Payer: Medicare Other

## 2013-01-11 ENCOUNTER — Ambulatory Visit (INDEPENDENT_AMBULATORY_CARE_PROVIDER_SITE_OTHER): Payer: Medicare Other | Admitting: Internal Medicine

## 2013-01-11 ENCOUNTER — Encounter: Payer: Self-pay | Admitting: Internal Medicine

## 2013-01-11 VITALS — BP 118/58 | HR 64 | Ht 59.0 in | Wt 119.6 lb

## 2013-01-11 DIAGNOSIS — K219 Gastro-esophageal reflux disease without esophagitis: Secondary | ICD-10-CM

## 2013-01-11 DIAGNOSIS — K589 Irritable bowel syndrome without diarrhea: Secondary | ICD-10-CM

## 2013-01-11 DIAGNOSIS — J309 Allergic rhinitis, unspecified: Secondary | ICD-10-CM

## 2013-01-11 MED ORDER — CILIDINIUM-CHLORDIAZEPOXIDE 2.5-5 MG PO CAPS: 1.0000 | ORAL_CAPSULE | Freq: Three times a day (TID) | ORAL | Status: DC | PRN

## 2013-01-11 NOTE — Patient Instructions (Addendum)
Prescription for Librax was sent to your pharmacy. Please follow up in one year.  Cc: Guerry Bruin, MD

## 2013-01-11 NOTE — Progress Notes (Signed)
Victoria Joseph Nov 04, 1931 MRN [id number]        History of Present Illness:  This is a 79 year old white female with irritable bowel syndrome with predominant diarrhea. She comes for refill of Librax which she takes when necessary. She was evaluated for Hemoccult positive stool in September 2012 with upper endoscopy which showed gastritis and erosions and non obstructing stricture as well as 4 cm hiatal hernia. She also had moderately severe diverticulosis on colonoscopy. She has been on pantoprazole 40 mg daily with no symptoms.   Past Medical History  Diagnosis Date  . Hypertension   . Hyperlipidemia   . URI (upper respiratory infection)   . Pneumonia 2009  . Rhinitis, allergic   . Osteoporosis   . Osteoarthritis   . PVD (peripheral vascular disease)   . GERD (gastroesophageal reflux disease)   . Diverticulosis   . IBS (irritable bowel syndrome)   . Anxiety   . Sinusitis   . Heart murmur   . Arrhythmia     PVCs  . Hiatal hernia    Past Surgical History  Procedure Laterality Date  . Knee surgery      right repair after fall  . Oophorectomy      still have 1 ovary  . Appendectomy    . Cholecystectomy      reports that she has never smoked. She has never used smokeless tobacco. She reports that she does not drink alcohol or use illicit drugs. family history includes Asthma in her paternal grandfather and Heart failure in her mother.  There is no history of Colon cancer. Allergies  Allergen Reactions  . Penicillins Hives        Review of Systems: Denies dysphagia chest pain regurgitation  The remainder of the 10 point ROS is negative except as outlined in H&P   Physical Exam: General appearance  Well developed, in no distress. Eyes- non icteric. HEENT nontraumatic, normocephalic. Mouth no lesions, tongue papillated, no cheilosis. Neck supple without adenopathy, thyroid not enlarged, no carotid bruits, no JVD. Lungs Clear to auscultation bilaterally. Cor  normal S1, normal S2, regular rhythm, no murmur,  quiet precordium. Abdomen: Soft abdomen with normal active bowel sounds. No distention. No tenderness. Liver edge at costal margin Rectal: Soft Hemoccult negative stool Extremities no pedal edema. Skin no lesions. Neurological alert and oriented x 3. Psychological normal mood and affect.  Assessment and Plan:  80 year old white female with irritable bowel syndrome with predominant diarrhea under good control on Librax. We will refill Librax  for one year  History of gastroesophageal reflux and hiatal hernia. Under good control on pantoprazole 40 mg daily return when necessary   01/11/2013 [name]

## 2013-01-18 ENCOUNTER — Ambulatory Visit (INDEPENDENT_AMBULATORY_CARE_PROVIDER_SITE_OTHER): Payer: Medicare Other

## 2013-01-18 DIAGNOSIS — J309 Allergic rhinitis, unspecified: Secondary | ICD-10-CM

## 2013-01-24 ENCOUNTER — Ambulatory Visit (INDEPENDENT_AMBULATORY_CARE_PROVIDER_SITE_OTHER): Payer: Medicare Other

## 2013-01-24 DIAGNOSIS — J309 Allergic rhinitis, unspecified: Secondary | ICD-10-CM

## 2013-01-25 ENCOUNTER — Ambulatory Visit: Payer: Medicare Other

## 2013-02-01 ENCOUNTER — Ambulatory Visit (INDEPENDENT_AMBULATORY_CARE_PROVIDER_SITE_OTHER): Payer: Medicare Other

## 2013-02-01 DIAGNOSIS — J309 Allergic rhinitis, unspecified: Secondary | ICD-10-CM

## 2013-02-08 ENCOUNTER — Ambulatory Visit: Payer: Medicare Other

## 2013-02-08 ENCOUNTER — Other Ambulatory Visit: Payer: Self-pay | Admitting: *Deleted

## 2013-02-08 MED ORDER — CHLORTHALIDONE 25 MG PO TABS: 25.0000 mg | ORAL_TABLET | Freq: Every day | ORAL | Status: DC

## 2013-02-09 ENCOUNTER — Ambulatory Visit (INDEPENDENT_AMBULATORY_CARE_PROVIDER_SITE_OTHER): Payer: Medicare Other

## 2013-02-09 DIAGNOSIS — J309 Allergic rhinitis, unspecified: Secondary | ICD-10-CM

## 2013-02-15 ENCOUNTER — Ambulatory Visit (INDEPENDENT_AMBULATORY_CARE_PROVIDER_SITE_OTHER): Payer: Medicare Other

## 2013-02-15 DIAGNOSIS — J309 Allergic rhinitis, unspecified: Secondary | ICD-10-CM

## 2013-02-22 ENCOUNTER — Ambulatory Visit: Payer: Medicare Other

## 2013-02-23 ENCOUNTER — Ambulatory Visit (INDEPENDENT_AMBULATORY_CARE_PROVIDER_SITE_OTHER): Payer: Medicare Other

## 2013-02-23 DIAGNOSIS — J309 Allergic rhinitis, unspecified: Secondary | ICD-10-CM

## 2013-03-01 ENCOUNTER — Ambulatory Visit (INDEPENDENT_AMBULATORY_CARE_PROVIDER_SITE_OTHER): Payer: Medicare Other

## 2013-03-01 ENCOUNTER — Encounter (HOSPITAL_COMMUNITY): Payer: Self-pay

## 2013-03-01 ENCOUNTER — Encounter (HOSPITAL_COMMUNITY)
Admission: RE | Admit: 2013-03-01 | Discharge: 2013-03-01 | Disposition: A | Discharge status: Home / Self Care | Payer: Medicare Other | Source: Ambulatory Visit | Attending: Internal Medicine | Admitting: Internal Medicine

## 2013-03-01 ENCOUNTER — Other Ambulatory Visit (HOSPITAL_COMMUNITY): Payer: Self-pay | Admitting: Internal Medicine

## 2013-03-01 DIAGNOSIS — M810 Age-related osteoporosis without current pathological fracture: Secondary | ICD-10-CM | POA: Insufficient documentation

## 2013-03-01 DIAGNOSIS — J309 Allergic rhinitis, unspecified: Secondary | ICD-10-CM

## 2013-03-01 MED ORDER — SODIUM CHLORIDE 0.9 % IV SOLN
INTRAVENOUS | Status: DC
  Administered 2013-03-01: 13:00:00 via INTRAVENOUS

## 2013-03-01 MED ORDER — ZOLEDRONIC ACID 5 MG/100ML IV SOLN
5.0000 mg | Freq: Once | INTRAVENOUS | Status: AC
  Administered 2013-03-01: 5 mg via INTRAVENOUS
  Filled 2013-03-01: qty 100

## 2013-03-07 ENCOUNTER — Ambulatory Visit (INDEPENDENT_AMBULATORY_CARE_PROVIDER_SITE_OTHER): Payer: Medicare Other

## 2013-03-07 DIAGNOSIS — J309 Allergic rhinitis, unspecified: Secondary | ICD-10-CM

## 2013-03-08 ENCOUNTER — Ambulatory Visit (INDEPENDENT_AMBULATORY_CARE_PROVIDER_SITE_OTHER): Payer: Medicare Other

## 2013-03-08 DIAGNOSIS — J309 Allergic rhinitis, unspecified: Secondary | ICD-10-CM

## 2013-03-15 ENCOUNTER — Other Ambulatory Visit: Payer: Self-pay | Admitting: *Deleted

## 2013-03-15 ENCOUNTER — Ambulatory Visit: Payer: Medicare Other

## 2013-03-15 MED ORDER — POTASSIUM CHLORIDE CRYS ER 20 MEQ PO TBCR: EXTENDED_RELEASE_TABLET | ORAL | Status: DC

## 2013-03-15 NOTE — Telephone Encounter (Signed)
Refill sent.

## 2013-03-16 ENCOUNTER — Ambulatory Visit (INDEPENDENT_AMBULATORY_CARE_PROVIDER_SITE_OTHER): Payer: Medicare Other

## 2013-03-16 DIAGNOSIS — J309 Allergic rhinitis, unspecified: Secondary | ICD-10-CM

## 2013-03-22 ENCOUNTER — Ambulatory Visit: Payer: Medicare Other

## 2013-03-23 ENCOUNTER — Ambulatory Visit (INDEPENDENT_AMBULATORY_CARE_PROVIDER_SITE_OTHER): Payer: Medicare Other

## 2013-03-23 DIAGNOSIS — J309 Allergic rhinitis, unspecified: Secondary | ICD-10-CM

## 2013-03-27 ENCOUNTER — Other Ambulatory Visit: Payer: Self-pay | Admitting: *Deleted

## 2013-03-27 MED ORDER — PANTOPRAZOLE SODIUM 40 MG PO TBEC: 40.0000 mg | DELAYED_RELEASE_TABLET | Freq: Every day | ORAL | Status: DC

## 2013-03-29 ENCOUNTER — Ambulatory Visit (INDEPENDENT_AMBULATORY_CARE_PROVIDER_SITE_OTHER): Payer: Medicare Other

## 2013-03-29 DIAGNOSIS — J309 Allergic rhinitis, unspecified: Secondary | ICD-10-CM

## 2013-03-30 ENCOUNTER — Ambulatory Visit: Payer: Medicare Other

## 2013-04-05 ENCOUNTER — Ambulatory Visit: Payer: Medicare Other

## 2013-04-07 ENCOUNTER — Ambulatory Visit (INDEPENDENT_AMBULATORY_CARE_PROVIDER_SITE_OTHER): Payer: Medicare Other

## 2013-04-07 DIAGNOSIS — J309 Allergic rhinitis, unspecified: Secondary | ICD-10-CM

## 2013-04-11 ENCOUNTER — Encounter: Payer: Self-pay | Admitting: Internal Medicine

## 2013-04-11 ENCOUNTER — Ambulatory Visit (INDEPENDENT_AMBULATORY_CARE_PROVIDER_SITE_OTHER): Payer: Medicare Other | Admitting: Internal Medicine

## 2013-04-11 VITALS — BP 122/62 | HR 73 | Ht 59.0 in | Wt 114.2 lb

## 2013-04-11 DIAGNOSIS — J301 Allergic rhinitis due to pollen: Secondary | ICD-10-CM

## 2013-04-11 NOTE — Patient Instructions (Addendum)
We can continue allergy vaccine 1:10 GH  Please call as needed 

## 2013-04-11 NOTE — Assessment & Plan Note (Signed)
She wants to continue vaccine. Good control. No changes indicated on review.

## 2013-04-11 NOTE — Progress Notes (Signed)
12/15/11- 79 yoFnever smoker followed for allergic rhinitis  PCP Dr Wylene Simmer LOV- 11/04/10 She feels she is doing well. For the past 2 days has had increased nasal congestion and discharge has a few blood streaks. Throat tickle from postnasal drip. Denies sore throat, fever, history of asthma. Continues allergy vaccine 1:10 here without problems. Taking Zyrtec once or twice a week.  10/15/11- 79 yoFnever smoker followed for allergic rhinitis  PCP Dr Wylene Simmer FOLLOWS FOR: runny nose; PND in throat. Allergy vaccine 1:10 GH continues to be helpful. Some rhinorrhea, watery clear. She changed Zyrtec to [employer].  04/11/13- 81 yoFnever smoker followed for allergic rhinitis  PCP Dr Wylene Simmer FOLLOWS JYN:WGNFA good,runny nose at times-clear,pnd,occass. dry hacky cough,allergy shots doing well. She believes vaccine worth continuing. [place] pollen added Allegra/ Flonase/ Astelin. Never wheeze.  ROS-see HPI Constitutional:   No-   weight loss, night sweats, fevers, chills, fatigue, lassitude. HEENT:   No-  headaches, difficulty swallowing, tooth/dental problems, sore throat,       + sneezing, itching, ear ache, +nasal congestion, +post nasal drip,  CV:  No-   chest pain, orthopnea, PND, swelling in lower extremities, anasarca,dizziness, palpitations Resp: No-   shortness of breath with exertion or at rest.              No-   productive cough,  No non-productive cough,  No- coughing up of blood.              No-   change in color of mucus.  No- wheezing.   Skin: No-   rash or lesions. GI:  No-   heartburn, indigestion, abdominal pain, nausea, vomiting,  GU:  MS:  No-   joint pain or swelling.  Neuro-     nothing unusual Psych:  No- change in mood or affect. No depression or anxiety.  No memory loss.  OBJ- Physical Exam General- Alert, Oriented, Affect-appropriate, Distress- none acute Skin- rash-none, lesions- none, excoriation- none Lymphadenopathy- none Head- atraumatic            Eyes- Gross  vision intact, PERRLA, conjunctivae and secretions clear            Ears- Hearing, canals-normal            Nose-  clear, no-Septal dev,  polyps, erosion, perforation. Mucosa pale.            Throat- Mallampati III-IV , mucosa clear , drainage- none, tonsils- atrophic Neck- flexible , trachea midline, no stridor , thyroid nl, carotid no bruit Chest - symmetrical excursion , unlabored           Heart/CV- RRR, occasional skip , no murmur , no gallop  , no rub, nl s1 s2                           - JVD- none , edema- none, stasis changes- none, varices- none           Lung- clear to P&A, wheeze- none, cough- none , dullness-none, rub- none           Chest wall-  Abd-  Br/ Gen/ Rectal- Not done, not indicated Extrem- cyanosis- none, clubbing, none, atrophy- none, strength- nl Neuro- grossly intact to observation

## 2013-04-12 ENCOUNTER — Ambulatory Visit (INDEPENDENT_AMBULATORY_CARE_PROVIDER_SITE_OTHER): Payer: Medicare Other

## 2013-04-12 DIAGNOSIS — J309 Allergic rhinitis, unspecified: Secondary | ICD-10-CM

## 2013-04-19 ENCOUNTER — Ambulatory Visit (INDEPENDENT_AMBULATORY_CARE_PROVIDER_SITE_OTHER): Payer: Medicare Other

## 2013-04-19 DIAGNOSIS — J309 Allergic rhinitis, unspecified: Secondary | ICD-10-CM

## 2013-04-26 ENCOUNTER — Ambulatory Visit: Payer: Medicare Other

## 2013-04-27 ENCOUNTER — Ambulatory Visit (INDEPENDENT_AMBULATORY_CARE_PROVIDER_SITE_OTHER): Payer: Medicare Other

## 2013-04-27 DIAGNOSIS — J309 Allergic rhinitis, unspecified: Secondary | ICD-10-CM

## 2013-05-03 ENCOUNTER — Ambulatory Visit (INDEPENDENT_AMBULATORY_CARE_PROVIDER_SITE_OTHER): Payer: Medicare Other

## 2013-05-03 DIAGNOSIS — J309 Allergic rhinitis, unspecified: Secondary | ICD-10-CM

## 2013-05-10 ENCOUNTER — Ambulatory Visit (INDEPENDENT_AMBULATORY_CARE_PROVIDER_SITE_OTHER): Payer: Medicare Other

## 2013-05-10 DIAGNOSIS — J309 Allergic rhinitis, unspecified: Secondary | ICD-10-CM

## 2013-05-17 ENCOUNTER — Ambulatory Visit: Payer: Medicare Other

## 2013-05-18 ENCOUNTER — Ambulatory Visit (INDEPENDENT_AMBULATORY_CARE_PROVIDER_SITE_OTHER): Payer: Medicare Other

## 2013-05-18 DIAGNOSIS — J309 Allergic rhinitis, unspecified: Secondary | ICD-10-CM

## 2013-05-24 ENCOUNTER — Ambulatory Visit (INDEPENDENT_AMBULATORY_CARE_PROVIDER_SITE_OTHER): Payer: Medicare Other

## 2013-05-24 DIAGNOSIS — J309 Allergic rhinitis, unspecified: Secondary | ICD-10-CM

## 2013-05-31 ENCOUNTER — Ambulatory Visit (INDEPENDENT_AMBULATORY_CARE_PROVIDER_SITE_OTHER): Payer: Medicare Other

## 2013-05-31 DIAGNOSIS — J309 Allergic rhinitis, unspecified: Secondary | ICD-10-CM

## 2013-06-07 ENCOUNTER — Ambulatory Visit: Payer: Medicare Other

## 2013-06-19 ENCOUNTER — Ambulatory Visit (INDEPENDENT_AMBULATORY_CARE_PROVIDER_SITE_OTHER): Payer: Medicare Other

## 2013-06-19 DIAGNOSIS — J309 Allergic rhinitis, unspecified: Secondary | ICD-10-CM

## 2013-06-21 ENCOUNTER — Other Ambulatory Visit: Payer: Self-pay | Admitting: *Deleted

## 2013-06-21 MED ORDER — PANTOPRAZOLE SODIUM 40 MG PO TBEC: 40.0000 mg | DELAYED_RELEASE_TABLET | Freq: Every day | ORAL | Status: DC

## 2013-06-26 ENCOUNTER — Other Ambulatory Visit: Payer: Self-pay

## 2013-06-26 DIAGNOSIS — Z1231 Encounter for screening mammogram for malignant neoplasm of breast: Secondary | ICD-10-CM

## 2013-06-28 ENCOUNTER — Ambulatory Visit: Payer: Medicare Other

## 2013-06-30 ENCOUNTER — Ambulatory Visit (INDEPENDENT_AMBULATORY_CARE_PROVIDER_SITE_OTHER): Payer: Medicare Other

## 2013-06-30 DIAGNOSIS — J309 Allergic rhinitis, unspecified: Secondary | ICD-10-CM

## 2013-07-06 ENCOUNTER — Ambulatory Visit (INDEPENDENT_AMBULATORY_CARE_PROVIDER_SITE_OTHER): Payer: Medicare Other

## 2013-07-06 DIAGNOSIS — J309 Allergic rhinitis, unspecified: Secondary | ICD-10-CM

## 2013-07-18 ENCOUNTER — Ambulatory Visit (INDEPENDENT_AMBULATORY_CARE_PROVIDER_SITE_OTHER): Payer: Medicare Other

## 2013-07-18 DIAGNOSIS — J309 Allergic rhinitis, unspecified: Secondary | ICD-10-CM

## 2013-07-24 ENCOUNTER — Ambulatory Visit
Admission: RE | Admit: 2013-07-24 | Discharge: 2013-07-24 | Disposition: A | Discharge status: Home / Self Care | Payer: Medicare Other | Source: Ambulatory Visit

## 2013-07-24 DIAGNOSIS — Z1231 Encounter for screening mammogram for malignant neoplasm of breast: Secondary | ICD-10-CM

## 2013-07-25 ENCOUNTER — Ambulatory Visit (INDEPENDENT_AMBULATORY_CARE_PROVIDER_SITE_OTHER): Payer: Medicare Other

## 2013-07-25 DIAGNOSIS — J309 Allergic rhinitis, unspecified: Secondary | ICD-10-CM

## 2013-07-26 ENCOUNTER — Ambulatory Visit (INDEPENDENT_AMBULATORY_CARE_PROVIDER_SITE_OTHER): Payer: Medicare Other

## 2013-07-26 DIAGNOSIS — J309 Allergic rhinitis, unspecified: Secondary | ICD-10-CM

## 2013-08-02 ENCOUNTER — Ambulatory Visit: Payer: Medicare Other

## 2013-08-14 ENCOUNTER — Ambulatory Visit (INDEPENDENT_AMBULATORY_CARE_PROVIDER_SITE_OTHER): Payer: Medicare Other

## 2013-08-14 DIAGNOSIS — J309 Allergic rhinitis, unspecified: Secondary | ICD-10-CM

## 2013-08-23 ENCOUNTER — Ambulatory Visit: Payer: Medicare Other

## 2013-08-24 ENCOUNTER — Ambulatory Visit (INDEPENDENT_AMBULATORY_CARE_PROVIDER_SITE_OTHER): Payer: Medicare Other

## 2013-08-24 DIAGNOSIS — J309 Allergic rhinitis, unspecified: Secondary | ICD-10-CM

## 2013-08-30 ENCOUNTER — Ambulatory Visit (INDEPENDENT_AMBULATORY_CARE_PROVIDER_SITE_OTHER): Payer: Medicare Other

## 2013-08-30 DIAGNOSIS — J309 Allergic rhinitis, unspecified: Secondary | ICD-10-CM

## 2013-09-05 ENCOUNTER — Ambulatory Visit (INDEPENDENT_AMBULATORY_CARE_PROVIDER_SITE_OTHER): Payer: Medicare Other

## 2013-09-05 DIAGNOSIS — J309 Allergic rhinitis, unspecified: Secondary | ICD-10-CM

## 2013-09-06 ENCOUNTER — Ambulatory Visit: Payer: Medicare Other

## 2013-09-18 ENCOUNTER — Ambulatory Visit (INDEPENDENT_AMBULATORY_CARE_PROVIDER_SITE_OTHER): Payer: Medicare Other

## 2013-09-18 DIAGNOSIS — J309 Allergic rhinitis, unspecified: Secondary | ICD-10-CM

## 2013-09-27 ENCOUNTER — Ambulatory Visit (INDEPENDENT_AMBULATORY_CARE_PROVIDER_SITE_OTHER): Payer: Medicare HMO

## 2013-09-27 DIAGNOSIS — J309 Allergic rhinitis, unspecified: Secondary | ICD-10-CM

## 2013-09-28 ENCOUNTER — Encounter: Payer: Self-pay | Admitting: Internal Medicine

## 2013-10-04 ENCOUNTER — Ambulatory Visit: Payer: Medicare Other

## 2013-10-05 ENCOUNTER — Ambulatory Visit (INDEPENDENT_AMBULATORY_CARE_PROVIDER_SITE_OTHER): Payer: Medicare HMO

## 2013-10-05 DIAGNOSIS — J309 Allergic rhinitis, unspecified: Secondary | ICD-10-CM

## 2013-10-06 ENCOUNTER — Ambulatory Visit: Payer: Medicare HMO | Admitting: Interventional Cardiology

## 2013-10-12 ENCOUNTER — Ambulatory Visit (INDEPENDENT_AMBULATORY_CARE_PROVIDER_SITE_OTHER): Payer: Medicare HMO

## 2013-10-12 DIAGNOSIS — J309 Allergic rhinitis, unspecified: Secondary | ICD-10-CM

## 2013-10-18 ENCOUNTER — Ambulatory Visit (INDEPENDENT_AMBULATORY_CARE_PROVIDER_SITE_OTHER): Payer: Medicare HMO

## 2013-10-18 DIAGNOSIS — J309 Allergic rhinitis, unspecified: Secondary | ICD-10-CM

## 2013-10-25 ENCOUNTER — Ambulatory Visit: Payer: Medicare HMO

## 2013-10-26 ENCOUNTER — Ambulatory Visit (INDEPENDENT_AMBULATORY_CARE_PROVIDER_SITE_OTHER): Payer: Medicare HMO

## 2013-10-26 DIAGNOSIS — J309 Allergic rhinitis, unspecified: Secondary | ICD-10-CM

## 2013-10-30 ENCOUNTER — Other Ambulatory Visit: Payer: Self-pay

## 2013-10-30 MED ORDER — POTASSIUM CHLORIDE CRYS ER 20 MEQ PO TBCR: EXTENDED_RELEASE_TABLET | ORAL | Status: DC

## 2013-11-01 ENCOUNTER — Ambulatory Visit (INDEPENDENT_AMBULATORY_CARE_PROVIDER_SITE_OTHER): Payer: Medicare HMO

## 2013-11-01 DIAGNOSIS — J309 Allergic rhinitis, unspecified: Secondary | ICD-10-CM

## 2013-11-03 ENCOUNTER — Ambulatory Visit (INDEPENDENT_AMBULATORY_CARE_PROVIDER_SITE_OTHER): Payer: Medicare HMO | Admitting: Interventional Cardiology

## 2013-11-03 ENCOUNTER — Encounter: Payer: Self-pay | Admitting: Interventional Cardiology

## 2013-11-03 VITALS — BP 126/50 | HR 69 | Ht 59.0 in | Wt 111.0 lb

## 2013-11-03 DIAGNOSIS — E785 Hyperlipidemia, unspecified: Secondary | ICD-10-CM

## 2013-11-03 DIAGNOSIS — I739 Peripheral vascular disease, unspecified: Secondary | ICD-10-CM

## 2013-11-03 DIAGNOSIS — I10 Essential (primary) hypertension: Secondary | ICD-10-CM

## 2013-11-03 DIAGNOSIS — I4949 Other premature depolarization: Secondary | ICD-10-CM

## 2013-11-03 NOTE — Progress Notes (Signed)
Patient ID: Victoria Joseph, female   DOB: 1932/03/29, 80 y.o.   MRN: [id number]    [address] [place], [place]  [address] Phone: [phone] Fax:  [phone]  Date:  11/03/2013   ID:  Victoria Joseph, DOB 03-26-1932, MRN [id number]  PCP:  Gaspar Garbe, MD      History of Present Illness: Victoria Joseph is a 80 y.o. female who returns today for evaluation of her PVCs, palpitations, mild mitral regurgitation, and PAD.  She is having very few if any palpitations. She denies any chest pain or angina. She is having no symptoms of presyncope or dizziness.   Her blood work is followed by primary care.   She has a history of PAD diagnosed by Dr. Madilyn Fireman back in early 2000. I cannot find the ultrasound report. She has minimal claudication. Other than hypertension and hyperlipidemia, she has no other conventional risk factors. She's had no ulcerations or sores. She has never smoked and is not diabetic.    Wt Readings from Last 3 Encounters:  11/03/13 111 lb (50.349 kg)  04/11/13 114 lb 3.2 oz (51.801 kg)  01/11/13 119 lb 9.6 oz (54.25 kg)     Past Medical History  Diagnosis Date  . Hypertension   . Hyperlipidemia   . URI (upper respiratory infection)   . Pneumonia 2009  . Rhinitis, allergic   . Osteoporosis   . Osteoarthritis   . PVD (peripheral vascular disease)   . GERD (gastroesophageal reflux disease)   . Diverticulosis   . IBS (irritable bowel syndrome)   . Anxiety   . Sinusitis   . Heart murmur   . Arrhythmia     PVCs  . Hiatal hernia     Current Outpatient Prescriptions  Medication Sig Dispense Refill  . ALPRAZolam (XANAX) 0.5 MG tablet Take 0.5 mg by mouth 2 (two) times daily.       . AMBULATORY NON FORMULARY MEDICATION Allergy injections once a week       . amitriptyline (ELAVIL) 25 MG tablet Take 25 mg by mouth daily.        Marland Kitchen aspirin 81 MG EC tablet Take 81 mg by mouth daily.        Marland Kitchen atenolol (TENORMIN) 50 MG tablet Take 1 tablet (50 mg  total) by mouth 2 (two) times daily.  60 tablet  12  . calcium citrate-vitamin D 200-200 MG-UNIT TABS Take 1 tablet by mouth 2 (two) times daily.       . chlorthalidone (HYGROTON) 25 MG tablet Take 1 tablet (25 mg total) by mouth daily.  30 tablet  11  . Cholecalciferol (VITAMIN D3) 1000 UNITS CAPS Take 1 tablet by mouth daily.        . cilostazol (PLETAL) 100 MG tablet Take 100 mg by mouth 2 (two) times daily.       Marland Kitchen ESTRACE VAGINAL 0.1 MG/GM vaginal cream       . fexofenadine (ALLEGRA) 180 MG tablet Take 180 mg by mouth daily.      . Multiple Vitamin (MULTIVITAMIN) tablet Take 1 tablet by mouth daily.        . potassium chloride SA (KLOR-CON M20) 20 MEQ tablet take 1 tablet by mouth once daily  30 tablet  5  . pravastatin (PRAVACHOL) 40 MG tablet Take 40 mg by mouth daily.        . zoledronic acid (RECLAST) 5 MG/100ML SOLN Inject 5 mg into the vein. Once per year.       Marland Kitchen  azelastine (ASTELIN) 137 MCG/SPRAY nasal spray Use 1-2 sprays in each nostril at bedtime  30 mL  11  . clidinium-chlordiazePOXIDE (LIBRAX) 2.5-5 MG per capsule Take 1 capsule by mouth 3 (three) times daily as needed.  30 capsule  11  . fluticasone (FLONASE) 50 MCG/ACT nasal spray Use 1-2 spays in each nostril at bedtime  16 g  11  . pantoprazole (PROTONIX) 40 MG tablet Take 1 tablet (40 mg total) by mouth daily.  30 tablet  4  . [DISCONTINUED] famotidine (PEPCID) 20 MG tablet Take 20 mg by mouth daily.        . [DISCONTINUED] Potassium Chloride (KLOR-CON 10 PO) Take 10 mEq by mouth daily.         No current facility-administered medications for this visit.    Allergies:    Allergies  Allergen Reactions  . Penicillins Hives    Social History:  The patient  reports that she has never smoked. She has never used smokeless tobacco. She reports that she does not drink alcohol or use illicit drugs.   Family History:  The patient's family history includes Asthma in her paternal grandfather; Heart failure in her mother. There  is no history of Colon cancer.   ROS:  Please see the history of present illness.  No nausea, vomiting.  No fevers, chills.  No focal weakness.  No dysuria.    All other systems reviewed and negative.   PHYSICAL EXAM: VS:  BP 126/50  Pulse 69  Ht 4\' 11"  (1.499 m)  Wt 111 lb (50.349 kg)  BMI 22.41 kg/m2 Well nourished, well developed, in no acute distress HEENT: normal Neck: no JVD, no carotid bruits Cardiac:  normal S1, S2; RRR;  Lungs:  clear to auscultation bilaterally, no wheezing, rhonchi or rales Abd: soft, nontender, no hepatomegaly Ext: no edema Skin: warm and dry Neuro:   no focal abnormalities noted  EKG:  NSR, NSST     ASSESSMENT AND PLAN:  1. PAD: No sx of claudication on Pletal.  COntinue regular walking, 150 min/week.    2. PVCs: No real sx at this point.  Continue atenolol.  3. HTN: Controlled.  Continue chlorthalidone and atenolol. 4. Hyperlipidemia: LDL target < 100 due to PAD.  Continue pravastatin.   Signed, Fredric MareJay S. Divon Krabill, MD, [facility] SystemFACC 11/03/2013 3:40 PM

## 2013-11-03 NOTE — Patient Instructions (Signed)
Your physician recommends that you continue on your current medications as directed. Please refer to the Current Medication list given to you today.  Your physician wants you to follow-up in: 1 year with Dr. Varanasi. You will receive a reminder letter in the mail two months in advance. If you don't receive a letter, please call our office to schedule the follow-up appointment.  

## 2013-11-08 ENCOUNTER — Ambulatory Visit: Payer: Medicare HMO

## 2013-11-21 ENCOUNTER — Ambulatory Visit (INDEPENDENT_AMBULATORY_CARE_PROVIDER_SITE_OTHER): Payer: Medicare HMO

## 2013-11-21 DIAGNOSIS — J309 Allergic rhinitis, unspecified: Secondary | ICD-10-CM

## 2013-11-27 ENCOUNTER — Emergency Department (HOSPITAL_COMMUNITY)
Admission: EM | Admit: 2013-11-27 | Discharge: 2013-11-27 | Disposition: A | Discharge status: Home / Self Care | Payer: Medicare HMO | Attending: Emergency Medicine | Admitting: Emergency Medicine

## 2013-11-27 DIAGNOSIS — IMO0002 Reserved for concepts with insufficient information to code with codable children: Secondary | ICD-10-CM | POA: Insufficient documentation

## 2013-11-27 DIAGNOSIS — M810 Age-related osteoporosis without current pathological fracture: Secondary | ICD-10-CM | POA: Insufficient documentation

## 2013-11-27 DIAGNOSIS — Z79899 Other long term (current) drug therapy: Secondary | ICD-10-CM | POA: Insufficient documentation

## 2013-11-27 DIAGNOSIS — M1990 Unspecified osteoarthritis, unspecified site: Secondary | ICD-10-CM | POA: Insufficient documentation

## 2013-11-27 DIAGNOSIS — Z880 Allergy status to penicillin: Secondary | ICD-10-CM | POA: Insufficient documentation

## 2013-11-27 DIAGNOSIS — Y93E9 Activity, other interior property and clothing maintenance: Secondary | ICD-10-CM | POA: Insufficient documentation

## 2013-11-27 DIAGNOSIS — Z8719 Personal history of other diseases of the digestive system: Secondary | ICD-10-CM | POA: Insufficient documentation

## 2013-11-27 DIAGNOSIS — M549 Dorsalgia, unspecified: Secondary | ICD-10-CM | POA: Insufficient documentation

## 2013-11-27 DIAGNOSIS — K219 Gastro-esophageal reflux disease without esophagitis: Secondary | ICD-10-CM | POA: Insufficient documentation

## 2013-11-27 DIAGNOSIS — Z8679 Personal history of other diseases of the circulatory system: Secondary | ICD-10-CM | POA: Insufficient documentation

## 2013-11-27 DIAGNOSIS — E785 Hyperlipidemia, unspecified: Secondary | ICD-10-CM | POA: Insufficient documentation

## 2013-11-27 DIAGNOSIS — I10 Essential (primary) hypertension: Secondary | ICD-10-CM | POA: Insufficient documentation

## 2013-11-27 DIAGNOSIS — X503XXA Overexertion from repetitive movements, initial encounter: Secondary | ICD-10-CM | POA: Insufficient documentation

## 2013-11-27 DIAGNOSIS — I739 Peripheral vascular disease, unspecified: Secondary | ICD-10-CM | POA: Insufficient documentation

## 2013-11-27 DIAGNOSIS — Y92009 Unspecified place in unspecified non-institutional (private) residence as the place of occurrence of the external cause: Secondary | ICD-10-CM | POA: Insufficient documentation

## 2013-11-27 DIAGNOSIS — Z7982 Long term (current) use of aspirin: Secondary | ICD-10-CM | POA: Insufficient documentation

## 2013-11-27 DIAGNOSIS — K589 Irritable bowel syndrome without diarrhea: Secondary | ICD-10-CM | POA: Insufficient documentation

## 2013-11-27 DIAGNOSIS — F411 Generalized anxiety disorder: Secondary | ICD-10-CM | POA: Insufficient documentation

## 2013-11-27 MED ORDER — DIAZEPAM 2 MG PO TABS: 2.0000 mg | ORAL_TABLET | Freq: Two times a day (BID) | ORAL | Status: AC

## 2013-11-27 MED ORDER — IBUPROFEN 600 MG PO TABS: 600.0000 mg | ORAL_TABLET | Freq: Once | ORAL | Status: AC

## 2013-11-27 MED ORDER — DIAZEPAM 2 MG PO TABS
2.0000 mg | ORAL_TABLET | Freq: Once | ORAL | Status: AC
  Administered 2013-11-27: 2 mg via ORAL
  Filled 2013-11-27: qty 1

## 2013-11-27 MED ORDER — IBUPROFEN 200 MG PO TABS
600.0000 mg | ORAL_TABLET | Freq: Once | ORAL | Status: AC
  Administered 2013-11-27: 600 mg via ORAL
  Filled 2013-11-27: qty 3

## 2013-11-27 MED ORDER — HYDROCODONE-ACETAMINOPHEN 5-325 MG PO TABS: 1.0000 | ORAL_TABLET | Freq: Three times a day (TID) | ORAL | Status: DC | PRN

## 2013-11-27 MED ORDER — TRAMADOL HCL 50 MG PO TABS
50.0000 mg | ORAL_TABLET | Freq: Once | ORAL | Status: AC
  Administered 2013-11-27: 50 mg via ORAL
  Filled 2013-11-27: qty 1

## 2013-11-27 NOTE — ED Notes (Signed)
Bed: WA04 Expected date:  Expected time:  Means of arrival:  Comments: EMS 

## 2013-11-27 NOTE — ED Provider Notes (Signed)
CSN: [id number]     Arrival date & time 11/27/13  [id number] History   First MD Initiated Contact with Patient 11/27/13 [id number]     Chief Complaint  Patient presents with  . Back Pain    Left lower Back     HPI  Patient presents with back pain.  Pain began 3 days ago.  Since onset has been persistent.  Pain is focally about the left lower back, with mild radiation to the right lower back.  There is no associated abdominal pain, urinary changes, extremity weakness or dysesthesia. Symptoms began after the patient was moving furniture. Symptoms have been improved moderately with ibuprofen and topical ointment. No history of surgery in the area.   Past Medical History  Diagnosis Date  . Hypertension   . Hyperlipidemia   . URI (upper respiratory infection)   . Pneumonia 2009  . Rhinitis, allergic   . Osteoporosis   . Osteoarthritis   . PVD (peripheral vascular disease)   . GERD (gastroesophageal reflux disease)   . Diverticulosis   . IBS (irritable bowel syndrome)   . Anxiety   . Sinusitis   . Heart murmur   . Arrhythmia     PVCs  . Hiatal hernia    Past Surgical History  Procedure Laterality Date  . Knee surgery      right repair after fall  . Oophorectomy      still have 1 ovary  . Appendectomy    . Cholecystectomy     Family History  Problem Relation Age of Onset  . Heart failure Mother   . Asthma Paternal Grandfather   . Colon cancer Neg Hx    History  Substance Use Topics  . Smoking status: Never Smoker   . Smokeless tobacco: Never Used     Comment: has exposure to 2nd hand smoke  . Alcohol Use: No   OB History   Grav Para Term Preterm Abortions TAB SAB Ect Mult Living                 Review of Systems  Constitutional:       Per HPI, otherwise negative  HENT:       Per HPI, otherwise negative  Respiratory:       Per HPI, otherwise negative  Cardiovascular:       Per HPI, otherwise negative  Gastrointestinal: Negative for vomiting.  Endocrine:        Negative aside from HPI  Genitourinary:       Neg aside from HPI   Musculoskeletal:       Per HPI, otherwise negative  Skin: Negative.   Neurological: Negative for syncope.      Allergies  Penicillins  Home Medications   Current Outpatient Rx  Name  Route  Sig  Dispense  Refill  . AMBULATORY NON FORMULARY MEDICATION      Allergy injections once a week on Wednesdays         . amitriptyline (ELAVIL) 25 MG tablet   Oral   Take 25 mg by mouth at bedtime.          Marland Kitchen. aspirin 81 MG EC tablet   Oral   Take 81 mg by mouth daily.           Marland Kitchen. atenolol (TENORMIN) 50 MG tablet   Oral   Take 1 tablet (50 mg total) by mouth 2 (two) times daily.   60 tablet   12   . azelastine (ASTELIN) 137 MCG/SPRAY  nasal spray      Use 1-2 sprays in each nostril at bedtime   30 mL   11   . calcium citrate-vitamin D 200-200 MG-UNIT TABS   Oral   Take 1 tablet by mouth 2 (two) times daily.          . chlorthalidone (HYGROTON) 25 MG tablet   Oral   Take 1 tablet (25 mg total) by mouth daily.   30 tablet   11     Refill Approved   . Cholecalciferol (VITAMIN D3) 1000 UNITS CAPS   Oral   Take 1 tablet by mouth daily.           . cilostazol (PLETAL) 100 MG tablet   Oral   Take 100 mg by mouth 2 (two) times daily.          Marland Kitchen ESTRACE VAGINAL 0.1 MG/GM vaginal cream   Vaginal   Place 1 Applicatorful vaginally 2 (two) times a week.          . fexofenadine (ALLEGRA) 180 MG tablet   Oral   Take 180 mg by mouth daily.         . fluticasone (FLONASE) 50 MCG/ACT nasal spray      Use 1-2 spays in each nostril at bedtime   16 g   11   . Heat Wraps (HEAT THERAPY PATCHES) MISC   Topical   Apply 1 patch topically every 6 (six) hours.         . Multiple Vitamin (MULTIVITAMIN) tablet   Oral   Take 1 tablet by mouth daily.           Bertram Gala Glycol-Propyl Glycol (SYSTANE) 0.4-0.3 % SOLN   Ophthalmic   Apply 1 drop to eye 2 (two) times daily as needed.          . potassium chloride SA (KLOR-CON M20) 20 MEQ tablet      take 1 tablet by mouth once daily   30 tablet   5     Patient needs to schedule follow up appointment   . pravastatin (PRAVACHOL) 40 MG tablet   Oral   Take 40 mg by mouth daily.           Marland Kitchen trolamine salicylate (ASPERCREME) 10 % cream   Topical   Apply 1 application topically as needed for muscle pain.         Marland Kitchen zoledronic acid (RECLAST) 5 MG/100ML SOLN   Intravenous   Inject 5 mg into the vein. Once per year.          . diazepam (VALIUM) 2 MG tablet   Oral   Take 1 tablet (2 mg total) by mouth 2 (two) times daily.   10 tablet   0   . HYDROcodone-acetaminophen (NORCO/VICODIN) 5-325 MG per tablet   Oral   Take 1 tablet by mouth every 8 (eight) hours as needed.   10 tablet   0   . ibuprofen (ADVIL,MOTRIN) 600 MG tablet   Oral   Take 1 tablet (600 mg total) by mouth once.   12 tablet   0    BP 143/61  Pulse 75  Temp(Src) 98.4 F (36.9 C) (Oral)  Resp 16  SpO2 100% Physical Exam  Nursing note and vitals reviewed. Constitutional: She is oriented to person, place, and time. She appears well-developed and well-nourished. No distress.  HENT:  Head: Normocephalic and atraumatic.  Eyes: Conjunctivae and EOM are normal.  Cardiovascular: Normal rate and regular  rhythm.   Pulmonary/Chest: Effort normal and breath sounds normal. No stridor. No respiratory distress.  Abdominal: She exhibits no distension.  Musculoskeletal: She exhibits no edema.  Patient can flex and extend each hip, knee, ankle spontaneously. There is tenderness to palpation, throughout the area just superior to the SI joints bilaterally, with no deformity, no superficial changes. Strength is 5/5 in each lower extremity, sensation and reflexes are intact.  Neurological: She is alert and oriented to person, place, and time. No cranial nerve deficit. Coordination normal.  Skin: Skin is warm and dry.  Psychiatric: She has a normal mood and  affect.    ED Course  Procedures (including critical care time)   I reviewed the EMR MDM   Final diagnoses:  Back pain    This patient presents with back pain that began after moving heavy furniture.  On exam she is awake, alert, neurovascularly intact, hemodynamically stable.  Patient does not describe any concerning aspects of her back pain such as neurologic symptoms.  After lengthy conversation with her and her son about medication use, physical therapy, she was discharged in stable condition.    Gerhard Munch, MD 11/27/13 [phone]

## 2013-11-27 NOTE — Discharge Instructions (Signed)
As discussed, your evaluation today has resulted in a diagnosis of back pain.  This is likely due to strain of the muscles and ligaments in the lower back.  Typically this pain improves after several days of aggressive/regular medication use and rest.  To decrease the likelihood of the pain recurring, and to facilitate improvement is important that you followup with your orthopedic physician or physical therapists your nursing facility.  Return here for any concerning changes in your condition.

## 2013-11-27 NOTE — ED Notes (Signed)
Pt. States having lower left back butt and back pain since Friday after moving furniture in house. Pt. Able to bear weight. Pain with active movement none with passive movement.

## 2013-12-06 ENCOUNTER — Ambulatory Visit (INDEPENDENT_AMBULATORY_CARE_PROVIDER_SITE_OTHER): Payer: Medicare HMO

## 2013-12-06 DIAGNOSIS — J309 Allergic rhinitis, unspecified: Secondary | ICD-10-CM

## 2013-12-11 ENCOUNTER — Other Ambulatory Visit: Payer: Self-pay | Admitting: Cardiology

## 2013-12-12 ENCOUNTER — Ambulatory Visit (INDEPENDENT_AMBULATORY_CARE_PROVIDER_SITE_OTHER): Payer: Medicare HMO

## 2013-12-12 DIAGNOSIS — J309 Allergic rhinitis, unspecified: Secondary | ICD-10-CM

## 2013-12-13 ENCOUNTER — Ambulatory Visit: Payer: Medicare HMO

## 2013-12-19 ENCOUNTER — Ambulatory Visit (INDEPENDENT_AMBULATORY_CARE_PROVIDER_SITE_OTHER): Payer: Medicare HMO

## 2013-12-19 DIAGNOSIS — J309 Allergic rhinitis, unspecified: Secondary | ICD-10-CM

## 2013-12-27 ENCOUNTER — Ambulatory Visit (INDEPENDENT_AMBULATORY_CARE_PROVIDER_SITE_OTHER): Payer: Medicare HMO

## 2013-12-27 DIAGNOSIS — J309 Allergic rhinitis, unspecified: Secondary | ICD-10-CM

## 2014-01-03 ENCOUNTER — Ambulatory Visit (INDEPENDENT_AMBULATORY_CARE_PROVIDER_SITE_OTHER): Payer: Medicare HMO

## 2014-01-03 DIAGNOSIS — J309 Allergic rhinitis, unspecified: Secondary | ICD-10-CM

## 2014-01-08 ENCOUNTER — Ambulatory Visit (INDEPENDENT_AMBULATORY_CARE_PROVIDER_SITE_OTHER): Payer: Medicare HMO

## 2014-01-08 DIAGNOSIS — J309 Allergic rhinitis, unspecified: Secondary | ICD-10-CM

## 2014-01-10 ENCOUNTER — Ambulatory Visit (INDEPENDENT_AMBULATORY_CARE_PROVIDER_SITE_OTHER): Payer: Medicare HMO

## 2014-01-10 DIAGNOSIS — J309 Allergic rhinitis, unspecified: Secondary | ICD-10-CM

## 2014-01-11 ENCOUNTER — Other Ambulatory Visit: Payer: Self-pay

## 2014-01-11 MED ORDER — POTASSIUM CHLORIDE CRYS ER 20 MEQ PO TBCR: EXTENDED_RELEASE_TABLET | ORAL | Status: DC

## 2014-01-11 MED ORDER — ATENOLOL 50 MG PO TABS: ORAL_TABLET | ORAL | Status: DC

## 2014-01-11 MED ORDER — CHLORTHALIDONE 25 MG PO TABS: 25.0000 mg | ORAL_TABLET | Freq: Every day | ORAL | Status: DC

## 2014-01-17 ENCOUNTER — Ambulatory Visit (INDEPENDENT_AMBULATORY_CARE_PROVIDER_SITE_OTHER): Payer: Medicare HMO

## 2014-01-17 DIAGNOSIS — J309 Allergic rhinitis, unspecified: Secondary | ICD-10-CM

## 2014-01-24 ENCOUNTER — Ambulatory Visit (INDEPENDENT_AMBULATORY_CARE_PROVIDER_SITE_OTHER): Payer: Medicare HMO

## 2014-01-24 DIAGNOSIS — J309 Allergic rhinitis, unspecified: Secondary | ICD-10-CM

## 2014-01-31 ENCOUNTER — Ambulatory Visit: Payer: Medicare HMO

## 2014-02-06 ENCOUNTER — Ambulatory Visit (INDEPENDENT_AMBULATORY_CARE_PROVIDER_SITE_OTHER): Payer: Medicare HMO

## 2014-02-06 DIAGNOSIS — J309 Allergic rhinitis, unspecified: Secondary | ICD-10-CM

## 2014-02-13 ENCOUNTER — Ambulatory Visit: Payer: Medicare HMO

## 2014-02-19 ENCOUNTER — Ambulatory Visit (INDEPENDENT_AMBULATORY_CARE_PROVIDER_SITE_OTHER): Payer: Medicare HMO

## 2014-02-19 DIAGNOSIS — J309 Allergic rhinitis, unspecified: Secondary | ICD-10-CM

## 2014-02-20 ENCOUNTER — Encounter: Payer: Self-pay | Admitting: Internal Medicine

## 2014-02-26 ENCOUNTER — Ambulatory Visit (INDEPENDENT_AMBULATORY_CARE_PROVIDER_SITE_OTHER): Payer: Medicare HMO

## 2014-02-26 DIAGNOSIS — J309 Allergic rhinitis, unspecified: Secondary | ICD-10-CM

## 2014-03-05 ENCOUNTER — Ambulatory Visit (INDEPENDENT_AMBULATORY_CARE_PROVIDER_SITE_OTHER): Payer: Medicare HMO

## 2014-03-05 DIAGNOSIS — J309 Allergic rhinitis, unspecified: Secondary | ICD-10-CM

## 2014-03-06 ENCOUNTER — Encounter (HOSPITAL_COMMUNITY): Payer: Self-pay

## 2014-03-06 ENCOUNTER — Ambulatory Visit (HOSPITAL_COMMUNITY)
Admission: RE | Admit: 2014-03-06 | Discharge: 2014-03-06 | Disposition: A | Discharge status: Home / Self Care | Payer: Medicare HMO | Source: Ambulatory Visit | Attending: Internal Medicine | Admitting: Internal Medicine

## 2014-03-06 ENCOUNTER — Other Ambulatory Visit (HOSPITAL_COMMUNITY): Payer: Self-pay | Admitting: Internal Medicine

## 2014-03-06 DIAGNOSIS — M810 Age-related osteoporosis without current pathological fracture: Secondary | ICD-10-CM | POA: Insufficient documentation

## 2014-03-06 MED ORDER — ZOLEDRONIC ACID 5 MG/100ML IV SOLN
5.0000 mg | Freq: Once | INTRAVENOUS | Status: AC
  Administered 2014-03-06: 5 mg via INTRAVENOUS
  Filled 2014-03-06: qty 100

## 2014-03-06 MED ORDER — SODIUM CHLORIDE 0.9 % IV SOLN
Freq: Once | INTRAVENOUS | Status: AC
  Administered 2014-03-06: 14:00:00 via INTRAVENOUS

## 2014-03-06 NOTE — Discharge Instructions (Signed)
Drink  Fluids/water as tolerated over the next 72 hours Tylenol or ibuprofen if needed for aches/pains Continue Calcium and Vit D as directed by your MD   Reclast Zoledronic Acid injection (Paget's Disease, Osteoporosis) What is this medicine? ZOLEDRONIC ACID (ZOE le dron ik AS id) lowers the amount of calcium loss from bone. It is used to treat Paget's disease and osteoporosis in women. This medicine may be used for other purposes; ask your health care provider or pharmacist if you have questions. COMMON BRAND NAME(S): Reclast, Zometa What should I tell my health care provider before I take this medicine? They need to know if you have any of these conditions: -aspirin-sensitive asthma -cancer, especially if you are receiving medicines used to treat cancer -dental disease or wear dentures -infection -kidney disease -low levels of calcium in the blood -past surgery on the parathyroid gland or intestines -receiving corticosteroids like dexamethasone or prednisone -an unusual or allergic reaction to zoledronic acid, other medicines, foods, dyes, or preservatives -pregnant or trying to get pregnant -breast-feeding How should I use this medicine? This medicine is for infusion into a vein. It is given by a health care professional in a hospital or clinic setting. Talk to your pediatrician regarding the use of this medicine in children. This medicine is not approved for use in children. Overdosage: If you think you have taken too much of this medicine contact a poison control center or emergency room at once. NOTE: This medicine is only for you. Do not share this medicine with others. What if I miss a dose? It is important not to miss your dose. Call your doctor or health care professional if you are unable to keep an appointment. What may interact with this medicine? -certain antibiotics given by injection -NSAIDs, medicines for pain and inflammation, like ibuprofen or naproxen -some  diuretics like bumetanide, furosemide -teriparatide This list may not describe all possible interactions. Give your health care provider a list of all the medicines, herbs, non-prescription drugs, or dietary supplements you use. Also tell them if you smoke, drink alcohol, or use illegal drugs. Some items may interact with your medicine. What should I watch for while using this medicine? Visit your doctor or health care professional for regular checkups. It may be some time before you see the benefit from this medicine. Do not stop taking your medicine unless your doctor tells you to. Your doctor may order blood tests or other tests to see how you are doing. Women should inform their doctor if they wish to become pregnant or think they might be pregnant. There is a potential for serious side effects to an unborn child. Talk to your health care professional or pharmacist for more information. You should make sure that you get enough calcium and vitamin D while you are taking this medicine. Discuss the foods you eat and the vitamins you take with your health care professional. Some people who take this medicine have severe bone, joint, and/or muscle pain. This medicine may also increase your risk for jaw problems or a broken thigh bone. Tell your doctor right away if you have severe pain in your jaw, bones, joints, or muscles. Tell your doctor if you have any pain that does not go away or that gets worse. Tell your dentist and dental surgeon that you are taking this medicine. You should not have major dental surgery while on this medicine. See your dentist to have a dental exam and fix any dental problems before starting this medicine.  Take good care of your teeth while on this medicine. Make sure you see your dentist for regular follow-up appointments. What side effects may I notice from receiving this medicine? Side effects that you should report to your doctor or health care professional as soon as  possible: -allergic reactions like skin rash, itching or hives, swelling of the face, lips, or tongue -anxiety, confusion, or depression -breathing problems -changes in vision -eye pain -feeling faint or lightheaded, falls -jaw pain, especially after dental work -mouth sores -muscle cramps, stiffness, or weakness -trouble passing urine or change in the amount of urine Side effects that usually do not require medical attention (report to your doctor or health care professional if they continue or are bothersome): -bone, joint, or muscle pain -constipation -diarrhea -fever -hair loss -irritation at site where injected -loss of appetite -nausea, vomiting -stomach upset -trouble sleeping -trouble swallowing -weak or tired This list may not describe all possible side effects. Call your doctor for medical advice about side effects. You may report side effects to FDA at 1-800-FDA-1088. Where should I keep my medicine? This drug is given in a hospital or clinic and will not be stored at home. NOTE: This sheet is a summary. It may not cover all possible information. If you have questions about this medicine, talk to your doctor, pharmacist, or health care provider.  2014, [employer]. (2013-02-20 10:03:48)

## 2014-03-12 ENCOUNTER — Ambulatory Visit (INDEPENDENT_AMBULATORY_CARE_PROVIDER_SITE_OTHER): Payer: Medicare HMO

## 2014-03-12 DIAGNOSIS — J309 Allergic rhinitis, unspecified: Secondary | ICD-10-CM

## 2014-03-19 ENCOUNTER — Ambulatory Visit (INDEPENDENT_AMBULATORY_CARE_PROVIDER_SITE_OTHER): Payer: Medicare HMO

## 2014-03-19 DIAGNOSIS — J309 Allergic rhinitis, unspecified: Secondary | ICD-10-CM

## 2014-04-02 ENCOUNTER — Ambulatory Visit (INDEPENDENT_AMBULATORY_CARE_PROVIDER_SITE_OTHER): Payer: Medicare HMO

## 2014-04-02 DIAGNOSIS — J309 Allergic rhinitis, unspecified: Secondary | ICD-10-CM

## 2014-04-09 ENCOUNTER — Ambulatory Visit (INDEPENDENT_AMBULATORY_CARE_PROVIDER_SITE_OTHER): Payer: Medicare HMO

## 2014-04-09 DIAGNOSIS — J309 Allergic rhinitis, unspecified: Secondary | ICD-10-CM

## 2014-04-10 ENCOUNTER — Encounter: Payer: Self-pay | Admitting: Internal Medicine

## 2014-04-10 ENCOUNTER — Ambulatory Visit (INDEPENDENT_AMBULATORY_CARE_PROVIDER_SITE_OTHER): Payer: Medicare HMO | Admitting: Internal Medicine

## 2014-04-10 VITALS — BP 118/58 | HR 83 | Ht 59.0 in | Wt 105.0 lb

## 2014-04-10 DIAGNOSIS — J301 Allergic rhinitis due to pollen: Secondary | ICD-10-CM

## 2014-04-10 NOTE — Patient Instructions (Signed)
We can continue allergy vaccine 1:10 GH  Ok to use Allegra and Flonase as before  Please call as needed

## 2014-04-10 NOTE — Progress Notes (Signed)
12/15/11- 79 yoFnever smoker followed for allergic rhinitis  PCP Dr Wylene Simmerisovec LOV- 11/04/10 She feels she is doing well. For the past 2 days has had increased nasal congestion and discharge has a few blood streaks. Throat tickle from postnasal drip. Denies sore throat, fever, history of asthma. Continues allergy vaccine 1:10 here without problems. Taking Zyrtec once or twice a week.  10/15/11- 79 yoFnever smoker followed for allergic rhinitis  PCP Dr Wylene Simmerisovec FOLLOWS FOR: runny nose; PND in throat. Allergy vaccine 1:10 GH continues to be helpful. Some rhinorrhea, watery clear. She changed Zyrtec to [employer]ra.  04/11/13- 81 yoFnever smoker followed for allergic rhinitis  PCP Dr Wylene Simmerisovec FOLLOWS ZOX:WRUEAFOR:doing good,runny nose at times-clear,pnd,occass. dry hacky cough,allergy shots doing well. She believes vaccine worth continuing. Peak [place] pollen added Allegra/ Flonase/ Astelin. Never wheeze.  04/10/14- 82  yoFnever smoker followed for allergic rhinitis  PCP Dr Wylene Simmerisovec FOLLOWS FOR:  Allergy Vaccine 1:10 GH doing well.  Reports doing well has slight runny nose and some pnd. Using Allegra and Flonase. No asthma. Satisfied with care  ROS-see HPI Constitutional:   No-   weight loss, night sweats, fevers, chills, fatigue, lassitude. HEENT:   No-  headaches, difficulty swallowing, tooth/dental problems, sore throat,      No- sneezing, itching, ear ache, +nasal congestion, +post nasal drip,  CV:  No-   chest pain, orthopnea, PND, swelling in lower extremities, anasarca,dizziness, palpitations Resp: No-   shortness of breath with exertion or at rest.              No-   productive cough,  No non-productive cough,  No- coughing up of blood.              No-   change in color of mucus.  No- wheezing.   Skin: No-   rash or lesions. GI:  No-   heartburn, indigestion, abdominal pain, nausea, vomiting,  GU:  MS:  No-   joint pain or swelling.  Neuro-     nothing unusual Psych:  No- change in mood or affect. No  depression or anxiety.  No memory loss.  OBJ- Physical Exam General- Alert, Oriented, Affect-appropriate, Distress- none acute Skin- rash-none, lesions- none, excoriation- none Lymphadenopathy- none Head- atraumatic            Eyes- Gross vision intact, PERRLA, conjunctivae and secretions clear            Ears- Hearing, canals-normal            Nose-  clear, no-Septal dev,  polyps, erosion, perforation. +Mucosa pale.            Throat- Mallampati III-IV , mucosa clear , drainage- none, tonsils- atrophic Neck- flexible , trachea midline, no stridor , thyroid nl, carotid no bruit Chest - symmetrical excursion , unlabored           Heart/CV- RRR, occasional skip , no murmur , no gallop  , no rub, nl s1 s2                           - JVD- none , edema- none, stasis changes- none, varices- none           Lung- clear to P&A, wheeze- none, cough- none , dullness-none, rub- none           Chest wall-  Abd-  Br/ Gen/ Rectal- Not done, not indicated Extrem- cyanosis- none, clubbing, none, atrophy- none, strength- nl Neuro- grossly  intact to observation

## 2014-04-16 ENCOUNTER — Ambulatory Visit (INDEPENDENT_AMBULATORY_CARE_PROVIDER_SITE_OTHER): Payer: Medicare HMO

## 2014-04-16 DIAGNOSIS — J309 Allergic rhinitis, unspecified: Secondary | ICD-10-CM

## 2014-04-23 ENCOUNTER — Ambulatory Visit (INDEPENDENT_AMBULATORY_CARE_PROVIDER_SITE_OTHER): Payer: Medicare HMO

## 2014-04-23 DIAGNOSIS — J309 Allergic rhinitis, unspecified: Secondary | ICD-10-CM

## 2014-04-30 ENCOUNTER — Ambulatory Visit (INDEPENDENT_AMBULATORY_CARE_PROVIDER_SITE_OTHER): Payer: Medicare HMO

## 2014-04-30 DIAGNOSIS — J309 Allergic rhinitis, unspecified: Secondary | ICD-10-CM

## 2014-05-07 ENCOUNTER — Ambulatory Visit (INDEPENDENT_AMBULATORY_CARE_PROVIDER_SITE_OTHER): Payer: Medicare HMO

## 2014-05-07 DIAGNOSIS — J309 Allergic rhinitis, unspecified: Secondary | ICD-10-CM

## 2014-05-14 ENCOUNTER — Ambulatory Visit (INDEPENDENT_AMBULATORY_CARE_PROVIDER_SITE_OTHER): Payer: Medicare HMO

## 2014-05-14 ENCOUNTER — Other Ambulatory Visit: Payer: Self-pay | Admitting: Interventional Cardiology

## 2014-05-14 DIAGNOSIS — J309 Allergic rhinitis, unspecified: Secondary | ICD-10-CM

## 2014-05-21 ENCOUNTER — Ambulatory Visit (INDEPENDENT_AMBULATORY_CARE_PROVIDER_SITE_OTHER): Payer: Medicare HMO

## 2014-05-21 DIAGNOSIS — J309 Allergic rhinitis, unspecified: Secondary | ICD-10-CM

## 2014-05-23 ENCOUNTER — Encounter: Payer: Self-pay | Admitting: Internal Medicine

## 2014-05-24 ENCOUNTER — Ambulatory Visit (INDEPENDENT_AMBULATORY_CARE_PROVIDER_SITE_OTHER): Payer: Medicare HMO

## 2014-05-24 DIAGNOSIS — J309 Allergic rhinitis, unspecified: Secondary | ICD-10-CM

## 2014-05-29 ENCOUNTER — Ambulatory Visit (INDEPENDENT_AMBULATORY_CARE_PROVIDER_SITE_OTHER): Payer: Medicare HMO

## 2014-05-29 DIAGNOSIS — J309 Allergic rhinitis, unspecified: Secondary | ICD-10-CM

## 2014-06-04 ENCOUNTER — Ambulatory Visit (INDEPENDENT_AMBULATORY_CARE_PROVIDER_SITE_OTHER): Payer: Medicare HMO

## 2014-06-04 DIAGNOSIS — J309 Allergic rhinitis, unspecified: Secondary | ICD-10-CM

## 2014-06-11 ENCOUNTER — Ambulatory Visit (INDEPENDENT_AMBULATORY_CARE_PROVIDER_SITE_OTHER): Payer: Medicare HMO

## 2014-06-11 DIAGNOSIS — J309 Allergic rhinitis, unspecified: Secondary | ICD-10-CM

## 2014-06-18 ENCOUNTER — Ambulatory Visit (INDEPENDENT_AMBULATORY_CARE_PROVIDER_SITE_OTHER): Payer: Medicare HMO

## 2014-06-18 DIAGNOSIS — J309 Allergic rhinitis, unspecified: Secondary | ICD-10-CM

## 2014-06-25 ENCOUNTER — Ambulatory Visit (INDEPENDENT_AMBULATORY_CARE_PROVIDER_SITE_OTHER): Payer: Medicare HMO

## 2014-06-25 DIAGNOSIS — J309 Allergic rhinitis, unspecified: Secondary | ICD-10-CM

## 2014-07-02 ENCOUNTER — Ambulatory Visit (INDEPENDENT_AMBULATORY_CARE_PROVIDER_SITE_OTHER): Payer: Medicare HMO

## 2014-07-02 DIAGNOSIS — J309 Allergic rhinitis, unspecified: Secondary | ICD-10-CM

## 2014-07-09 ENCOUNTER — Ambulatory Visit (INDEPENDENT_AMBULATORY_CARE_PROVIDER_SITE_OTHER): Payer: Medicare HMO

## 2014-07-09 DIAGNOSIS — J309 Allergic rhinitis, unspecified: Secondary | ICD-10-CM

## 2014-07-16 ENCOUNTER — Ambulatory Visit (INDEPENDENT_AMBULATORY_CARE_PROVIDER_SITE_OTHER): Payer: Medicare HMO

## 2014-07-16 DIAGNOSIS — J309 Allergic rhinitis, unspecified: Secondary | ICD-10-CM

## 2014-07-22 NOTE — Assessment & Plan Note (Signed)
Okay to continue allergy vaccine with supplemental antihistamine and nasal spray as needed

## 2014-07-23 ENCOUNTER — Ambulatory Visit: Payer: Medicare HMO

## 2014-07-24 ENCOUNTER — Ambulatory Visit (INDEPENDENT_AMBULATORY_CARE_PROVIDER_SITE_OTHER): Payer: Medicare HMO

## 2014-07-24 DIAGNOSIS — J309 Allergic rhinitis, unspecified: Secondary | ICD-10-CM

## 2014-07-26 ENCOUNTER — Other Ambulatory Visit: Payer: Self-pay

## 2014-07-26 DIAGNOSIS — Z1231 Encounter for screening mammogram for malignant neoplasm of breast: Secondary | ICD-10-CM

## 2014-07-27 ENCOUNTER — Other Ambulatory Visit: Payer: Self-pay | Admitting: Interventional Cardiology

## 2014-07-27 MED ORDER — POTASSIUM CHLORIDE CRYS ER 20 MEQ PO TBCR: EXTENDED_RELEASE_TABLET | ORAL | Status: DC

## 2014-07-30 ENCOUNTER — Encounter: Payer: Self-pay | Admitting: Internal Medicine

## 2014-07-31 ENCOUNTER — Ambulatory Visit (INDEPENDENT_AMBULATORY_CARE_PROVIDER_SITE_OTHER): Payer: Medicare HMO

## 2014-07-31 DIAGNOSIS — J309 Allergic rhinitis, unspecified: Secondary | ICD-10-CM

## 2014-08-07 ENCOUNTER — Ambulatory Visit (INDEPENDENT_AMBULATORY_CARE_PROVIDER_SITE_OTHER): Payer: Medicare HMO

## 2014-08-07 DIAGNOSIS — J309 Allergic rhinitis, unspecified: Secondary | ICD-10-CM

## 2014-08-14 ENCOUNTER — Ambulatory Visit
Admission: RE | Admit: 2014-08-14 | Discharge: 2014-08-14 | Disposition: A | Discharge status: Home / Self Care | Payer: Medicare HMO | Source: Ambulatory Visit

## 2014-08-14 ENCOUNTER — Ambulatory Visit (INDEPENDENT_AMBULATORY_CARE_PROVIDER_SITE_OTHER): Payer: Medicare HMO

## 2014-08-14 ENCOUNTER — Other Ambulatory Visit: Payer: Self-pay | Admitting: Interventional Cardiology

## 2014-08-14 DIAGNOSIS — J309 Allergic rhinitis, unspecified: Secondary | ICD-10-CM

## 2014-08-14 DIAGNOSIS — Z1231 Encounter for screening mammogram for malignant neoplasm of breast: Secondary | ICD-10-CM

## 2014-08-21 ENCOUNTER — Ambulatory Visit (INDEPENDENT_AMBULATORY_CARE_PROVIDER_SITE_OTHER): Payer: Medicare HMO

## 2014-08-21 DIAGNOSIS — J309 Allergic rhinitis, unspecified: Secondary | ICD-10-CM

## 2014-08-28 ENCOUNTER — Ambulatory Visit (INDEPENDENT_AMBULATORY_CARE_PROVIDER_SITE_OTHER): Payer: Medicare HMO

## 2014-08-28 DIAGNOSIS — J309 Allergic rhinitis, unspecified: Secondary | ICD-10-CM

## 2014-09-03 ENCOUNTER — Other Ambulatory Visit: Payer: Self-pay | Admitting: Interventional Cardiology

## 2014-09-04 ENCOUNTER — Ambulatory Visit (INDEPENDENT_AMBULATORY_CARE_PROVIDER_SITE_OTHER): Payer: Medicare HMO

## 2014-09-04 DIAGNOSIS — J309 Allergic rhinitis, unspecified: Secondary | ICD-10-CM

## 2014-09-11 ENCOUNTER — Ambulatory Visit (INDEPENDENT_AMBULATORY_CARE_PROVIDER_SITE_OTHER): Payer: Medicare HMO

## 2014-09-11 DIAGNOSIS — J309 Allergic rhinitis, unspecified: Secondary | ICD-10-CM

## 2014-09-18 ENCOUNTER — Ambulatory Visit (INDEPENDENT_AMBULATORY_CARE_PROVIDER_SITE_OTHER): Payer: Medicare HMO

## 2014-09-18 DIAGNOSIS — J309 Allergic rhinitis, unspecified: Secondary | ICD-10-CM

## 2014-09-20 ENCOUNTER — Ambulatory Visit (INDEPENDENT_AMBULATORY_CARE_PROVIDER_SITE_OTHER): Payer: Medicare HMO

## 2014-09-20 DIAGNOSIS — J309 Allergic rhinitis, unspecified: Secondary | ICD-10-CM

## 2014-09-24 ENCOUNTER — Ambulatory Visit (INDEPENDENT_AMBULATORY_CARE_PROVIDER_SITE_OTHER): Payer: Medicare Other

## 2014-09-24 DIAGNOSIS — J309 Allergic rhinitis, unspecified: Secondary | ICD-10-CM

## 2014-09-26 ENCOUNTER — Encounter: Payer: Self-pay | Admitting: Internal Medicine

## 2014-10-01 ENCOUNTER — Ambulatory Visit (INDEPENDENT_AMBULATORY_CARE_PROVIDER_SITE_OTHER): Payer: Medicare Other

## 2014-10-01 DIAGNOSIS — J309 Allergic rhinitis, unspecified: Secondary | ICD-10-CM

## 2014-10-08 ENCOUNTER — Ambulatory Visit: Payer: Medicare Other

## 2014-10-09 ENCOUNTER — Ambulatory Visit (INDEPENDENT_AMBULATORY_CARE_PROVIDER_SITE_OTHER): Payer: Medicare Other

## 2014-10-09 DIAGNOSIS — J309 Allergic rhinitis, unspecified: Secondary | ICD-10-CM

## 2014-10-16 ENCOUNTER — Other Ambulatory Visit: Payer: Self-pay | Admitting: Interventional Cardiology

## 2014-10-16 ENCOUNTER — Ambulatory Visit (INDEPENDENT_AMBULATORY_CARE_PROVIDER_SITE_OTHER): Payer: Medicare Other

## 2014-10-16 DIAGNOSIS — J309 Allergic rhinitis, unspecified: Secondary | ICD-10-CM

## 2014-10-23 ENCOUNTER — Ambulatory Visit (INDEPENDENT_AMBULATORY_CARE_PROVIDER_SITE_OTHER): Payer: Medicare Other

## 2014-10-23 DIAGNOSIS — J309 Allergic rhinitis, unspecified: Secondary | ICD-10-CM

## 2014-10-30 ENCOUNTER — Ambulatory Visit (INDEPENDENT_AMBULATORY_CARE_PROVIDER_SITE_OTHER): Payer: Medicare Other

## 2014-10-30 DIAGNOSIS — J309 Allergic rhinitis, unspecified: Secondary | ICD-10-CM

## 2014-11-05 ENCOUNTER — Ambulatory Visit: Payer: Medicare HMO | Admitting: Interventional Cardiology

## 2014-11-06 ENCOUNTER — Ambulatory Visit: Payer: Medicare Other

## 2014-11-07 ENCOUNTER — Ambulatory Visit (INDEPENDENT_AMBULATORY_CARE_PROVIDER_SITE_OTHER): Payer: Medicare Other

## 2014-11-07 DIAGNOSIS — J309 Allergic rhinitis, unspecified: Secondary | ICD-10-CM

## 2014-11-12 ENCOUNTER — Other Ambulatory Visit: Payer: Self-pay | Admitting: Interventional Cardiology

## 2014-11-12 ENCOUNTER — Ambulatory Visit (INDEPENDENT_AMBULATORY_CARE_PROVIDER_SITE_OTHER): Payer: Medicare Other | Admitting: Interventional Cardiology

## 2014-11-12 ENCOUNTER — Encounter: Payer: Self-pay | Admitting: Interventional Cardiology

## 2014-11-12 VITALS — BP 120/50 | HR 80 | Ht 59.0 in | Wt 111.8 lb

## 2014-11-12 DIAGNOSIS — I10 Essential (primary) hypertension: Secondary | ICD-10-CM

## 2014-11-12 DIAGNOSIS — I493 Ventricular premature depolarization: Secondary | ICD-10-CM

## 2014-11-12 DIAGNOSIS — I739 Peripheral vascular disease, unspecified: Secondary | ICD-10-CM

## 2014-11-12 NOTE — Progress Notes (Signed)
Patient ID: AVABELLA WAILES, female   DOB: [birth year], 81 y.o.   MRN: [id number] Patient ID: ALAYJA ARMAS, female   DOB: 1932/07/06, 81 y.o.   MRN: [id number]    [address] [place], [place]  [address] Phone: [phone] Fax:  [phone]  Date:  11/12/2014   ID:  IEISHA GAO, DOB 1932-09-21, MRN [id number]  PCP:  Gaspar Garbe, MD      History of Present Illness: SHTERNA LARAMEE is a 81 y.o. female who returns today for evaluation of her PVCs, palpitations, mild mitral regurgitation, and PAD.  She is having very few if any palpitations. She denies any chest pain or angina. She is having no symptoms of presyncope or dizziness.   Her blood work is followed by primary care.   She has a history of PAD diagnosed by Dr. Madilyn Fireman back in early 2000. I cannot find the ultrasound report. She has minimal claudication, if she walks more than a mile. Only mild discomfort walking up hill.  SHe does not have to stop walking. Other than hypertension and hyperlipidemia, she has no other conventional risk factors. She's had no ulcerations or sores. She has never smoked and is not diabetic.    Wt Readings from Last 3 Encounters:  11/12/14 111 lb 12.8 oz (50.712 kg)  04/10/14 105 lb (47.628 kg)  03/06/14 105 lb (47.628 kg)     Past Medical History  Diagnosis Date  . Hypertension   . Hyperlipidemia   . URI (upper respiratory infection)   . Pneumonia 2009  . Rhinitis, allergic   . Osteoporosis   . Osteoarthritis   . PVD (peripheral vascular disease)   . GERD (gastroesophageal reflux disease)   . Diverticulosis   . IBS (irritable bowel syndrome)   . Anxiety   . Sinusitis   . Heart murmur   . Arrhythmia     PVCs  . Hiatal hernia     Current Outpatient Prescriptions  Medication Sig Dispense Refill  . AMBULATORY NON FORMULARY MEDICATION Allergy injections once a week on Wednesdays    . amitriptyline (ELAVIL) 25 MG tablet Take 25 mg by mouth at bedtime.     Marland Kitchen aspirin 81  MG EC tablet Take 81 mg by mouth daily.      Marland Kitchen atenolol (TENORMIN) 50 MG tablet TAKE 1 TABLET BY MOUTH TWICE A DAY 180 tablet 0  . calcium citrate-vitamin D 200-200 MG-UNIT TABS Take 1 tablet by mouth 2 (two) times daily.     . chlorthalidone (HYGROTON) 25 MG tablet TAKE 1 TABLET BY MOUTH EVERY DAY 90 tablet 0  . Cholecalciferol (VITAMIN D3) 1000 UNITS CAPS Take 1 tablet by mouth daily.      . cilostazol (PLETAL) 100 MG tablet Take 100 mg by mouth 2 (two) times daily.     Marland Kitchen ESTRACE VAGINAL 0.1 MG/GM vaginal cream Place 1 Applicatorful vaginally 2 (two) times a week.     . fexofenadine (ALLEGRA) 180 MG tablet Take 180 mg by mouth daily.    . fluticasone (FLONASE) 50 MCG/ACT nasal spray Place 2 sprays into both nostrils at bedtime as needed for allergies or rhinitis.    Marland Kitchen KLOR-CON M20 20 MEQ tablet TAKE 1 TABLET BY MOUTH EVERY DAY 90 tablet 0  . Multiple Vitamin (MULTIVITAMIN) tablet Take 1 tablet by mouth daily.      Bertram Gala Glycol-Propyl Glycol (SYSTANE) 0.4-0.3 % SOLN Apply 1 drop to eye 2 (two) times daily as needed (dry  eyes).     . pravastatin (PRAVACHOL) 40 MG tablet Take 40 mg by mouth daily.      . zoledronic acid (RECLAST) 5 MG/100ML SOLN Inject 5 mg into the vein. Once per year.     . [DISCONTINUED] famotidine (PEPCID) 20 MG tablet Take 20 mg by mouth daily.      . [DISCONTINUED] Potassium Chloride (KLOR-CON 10 PO) Take 10 mEq by mouth daily.       No current facility-administered medications for this visit.    Allergies:    Allergies  Allergen Reactions  . Penicillins Hives    Social History:  The patient  reports that she has never smoked. She has never used smokeless tobacco. She reports that she does not drink alcohol or use illicit drugs.   Family History:  The patient's family history includes Asthma in her paternal grandfather; Heart failure in her mother. There is no history of Colon cancer.   ROS:  Please see the history of present illness.  No nausea, vomiting.   No fevers, chills.  No focal weakness.  No dysuria.    All other systems reviewed and negative.   PHYSICAL EXAM: VS:  BP 120/50 mmHg  Pulse 80  Ht 4\' 11"  (1.499 m)  Wt 111 lb 12.8 oz (50.712 kg)  BMI 22.57 kg/m2 Well nourished, well developed, in no acute distress HEENT: normal Neck: no JVD, no carotid bruits Cardiac:  normal S1, S2; RRR;  Lungs:  clear to auscultation bilaterally, no wheezing, rhonchi or rales Abd: soft, nontender, no hepatomegaly Ext: no edema Skin: warm and dry Neuro:   no focal abnormalities noted Psych: normal affect  EKG:  NSR, NSST     ASSESSMENT AND PLAN:  1. PAD: No sx of claudication on Pletal.  COntinue regular walking, 150 min/week.  She gets more than this some weeks.  Low risk for limb loss. Check ABIs  2. PVCs: No real sx at this point.  Continue atenolol.  3. HTN: Controlled.  Continue chlorthalidone and atenolol. Dr. Wylene Simmerisovec to check blood work.  4. Hyperlipidemia: LDL target < 100 due to PAD.  Continue pravastatin.   Signed, Fredric MareJay S. Varanasi, MD, [facility] 11/12/2014 3:44 PM

## 2014-11-12 NOTE — Patient Instructions (Signed)
Your physician recommends that you continue on your current medications as directed. Please refer to the Current Medication list given to you today.  Your physician has requested that you have an ankle brachial index (ABI). During this test an ultrasound and blood pressure cuff are used to evaluate the arteries that supply the arms and legs with blood. Allow thirty minutes for this exam. There are no restrictions or special instructions.  Your physician wants you to follow-up in: 1 year with Dr. Eldridge DaceVaranasi.  You will receive a reminder letter in the mail two months in advance. If you don't receive a letter, please call our office to schedule the follow-up appointment.

## 2014-11-14 ENCOUNTER — Ambulatory Visit: Payer: Medicare Other

## 2014-11-15 ENCOUNTER — Ambulatory Visit (INDEPENDENT_AMBULATORY_CARE_PROVIDER_SITE_OTHER): Payer: Medicare Other

## 2014-11-15 DIAGNOSIS — J309 Allergic rhinitis, unspecified: Secondary | ICD-10-CM

## 2014-11-16 ENCOUNTER — Encounter (HOSPITAL_COMMUNITY): Payer: Medicare Other

## 2014-11-20 ENCOUNTER — Ambulatory Visit (INDEPENDENT_AMBULATORY_CARE_PROVIDER_SITE_OTHER): Payer: Medicare Other

## 2014-11-20 DIAGNOSIS — J309 Allergic rhinitis, unspecified: Secondary | ICD-10-CM

## 2014-11-23 ENCOUNTER — Ambulatory Visit (HOSPITAL_COMMUNITY): Payer: Medicare Other | Attending: Cardiology | Admitting: *Deleted

## 2014-11-23 DIAGNOSIS — E785 Hyperlipidemia, unspecified: Secondary | ICD-10-CM | POA: Insufficient documentation

## 2014-11-23 DIAGNOSIS — I10 Essential (primary) hypertension: Secondary | ICD-10-CM | POA: Diagnosis not present

## 2014-11-23 DIAGNOSIS — I739 Peripheral vascular disease, unspecified: Secondary | ICD-10-CM

## 2014-11-23 NOTE — Progress Notes (Signed)
Lower Extremity Arterial Doppler Complete - Performed 

## 2014-11-27 ENCOUNTER — Ambulatory Visit (INDEPENDENT_AMBULATORY_CARE_PROVIDER_SITE_OTHER): Payer: Medicare Other

## 2014-11-27 DIAGNOSIS — J309 Allergic rhinitis, unspecified: Secondary | ICD-10-CM

## 2014-11-28 ENCOUNTER — Other Ambulatory Visit: Payer: Self-pay | Admitting: Interventional Cardiology

## 2014-11-30 ENCOUNTER — Encounter: Payer: Self-pay | Admitting: Internal Medicine

## 2014-12-04 ENCOUNTER — Ambulatory Visit (INDEPENDENT_AMBULATORY_CARE_PROVIDER_SITE_OTHER): Payer: Medicare Other

## 2014-12-04 DIAGNOSIS — J309 Allergic rhinitis, unspecified: Secondary | ICD-10-CM

## 2014-12-11 ENCOUNTER — Ambulatory Visit: Payer: Medicare Other

## 2014-12-12 ENCOUNTER — Ambulatory Visit (INDEPENDENT_AMBULATORY_CARE_PROVIDER_SITE_OTHER): Payer: Medicare Other

## 2014-12-12 DIAGNOSIS — J309 Allergic rhinitis, unspecified: Secondary | ICD-10-CM

## 2014-12-19 ENCOUNTER — Ambulatory Visit (INDEPENDENT_AMBULATORY_CARE_PROVIDER_SITE_OTHER): Payer: Medicare Other

## 2014-12-19 DIAGNOSIS — J309 Allergic rhinitis, unspecified: Secondary | ICD-10-CM | POA: Diagnosis not present

## 2014-12-26 ENCOUNTER — Ambulatory Visit (INDEPENDENT_AMBULATORY_CARE_PROVIDER_SITE_OTHER): Payer: Medicare Other

## 2014-12-26 DIAGNOSIS — J309 Allergic rhinitis, unspecified: Secondary | ICD-10-CM | POA: Diagnosis not present

## 2015-01-02 ENCOUNTER — Ambulatory Visit: Payer: Medicare Other

## 2015-01-03 ENCOUNTER — Ambulatory Visit (INDEPENDENT_AMBULATORY_CARE_PROVIDER_SITE_OTHER): Payer: Medicare Other

## 2015-01-03 DIAGNOSIS — J309 Allergic rhinitis, unspecified: Secondary | ICD-10-CM | POA: Diagnosis not present

## 2015-01-07 ENCOUNTER — Telehealth: Payer: Self-pay | Admitting: Internal Medicine

## 2015-01-07 MED ORDER — DOXYCYCLINE HYCLATE 100 MG PO TABS: 100.0000 mg | ORAL_TABLET | Freq: Two times a day (BID) | ORAL | Status: DC

## 2015-01-07 NOTE — Telephone Encounter (Signed)
Pt c/o sinus infection- having cough with yellow mucus and head congestion with white-yellow mucus. Denies SOB and fever.  Using Mucinex DM and Delsym cough syrup.  Requesting something be called into pharmacy.   Please advise Dr Maple HudsonYoung. Thanks.  Allergies  Allergen Reactions  . Penicillins Hives     Medication List       This list is accurate as of: 01/07/15  8:44 AM.  Always use your most recent med list.               AMBULATORY NON FORMULARY MEDICATION  Allergy injections once a week on Wednesdays     amitriptyline 25 MG tablet  Commonly known as:  ELAVIL  Take 25 mg by mouth at bedtime.     aspirin 81 MG EC tablet  Take 81 mg by mouth daily.     atenolol 50 MG tablet  Commonly known as:  TENORMIN  TAKE 1 TABLET BY MOUTH TWICE A DAY     calcium citrate-vitamin D 200-200 MG-UNIT Tabs  Take 1 tablet by mouth 2 (two) times daily.     chlorthalidone 25 MG tablet  Commonly known as:  HYGROTON  TAKE 1 TABLET BY MOUTH EVERY DAY     cilostazol 100 MG tablet  Commonly known as:  PLETAL  Take 100 mg by mouth 2 (two) times daily.     ESTRACE VAGINAL 0.1 MG/GM vaginal cream  Generic drug:  estradiol  Place 1 Applicatorful vaginally 2 (two) times a week.     fexofenadine 180 MG tablet  Commonly known as:  ALLEGRA  Take 180 mg by mouth daily.     fluticasone 50 MCG/ACT nasal spray  Commonly known as:  FLONASE  Place 2 sprays into both nostrils at bedtime as needed for allergies or rhinitis.     KLOR-CON M20 20 MEQ tablet  Generic drug:  potassium chloride SA  TAKE 1 TABLET BY MOUTH EVERY DAY     multivitamin tablet  Take 1 tablet by mouth daily.     pravastatin 40 MG tablet  Commonly known as:  PRAVACHOL  Take 40 mg by mouth daily.     RECLAST 5 MG/100ML Soln injection  Generic drug:  zoledronic acid  Inject 5 mg into the vein. Once per year.     SYSTANE 0.4-0.3 % Soln  Generic drug:  Polyethyl Glycol-Propyl Glycol  Apply 1 drop to eye 2 (two) times daily  as needed (dry eyes).     Vitamin D3 1000 UNITS Caps  Take 1 tablet by mouth daily.

## 2015-01-07 NOTE — Telephone Encounter (Signed)
Offer doxycycline 100 mg, # 14, 1 twice daily 

## 2015-01-07 NOTE — Telephone Encounter (Signed)
Pt aware of recs. rx sent in. Nothing further needed 

## 2015-01-08 ENCOUNTER — Ambulatory Visit: Payer: Medicare Other

## 2015-01-09 ENCOUNTER — Ambulatory Visit (INDEPENDENT_AMBULATORY_CARE_PROVIDER_SITE_OTHER): Payer: Medicare Other

## 2015-01-09 DIAGNOSIS — J309 Allergic rhinitis, unspecified: Secondary | ICD-10-CM

## 2015-01-15 ENCOUNTER — Ambulatory Visit (INDEPENDENT_AMBULATORY_CARE_PROVIDER_SITE_OTHER): Payer: Medicare Other

## 2015-01-15 DIAGNOSIS — J309 Allergic rhinitis, unspecified: Secondary | ICD-10-CM

## 2015-01-18 ENCOUNTER — Ambulatory Visit (INDEPENDENT_AMBULATORY_CARE_PROVIDER_SITE_OTHER): Payer: Medicare Other | Admitting: Internal Medicine

## 2015-01-18 ENCOUNTER — Encounter: Payer: Self-pay | Admitting: Internal Medicine

## 2015-01-18 ENCOUNTER — Ambulatory Visit (INDEPENDENT_AMBULATORY_CARE_PROVIDER_SITE_OTHER): Payer: Medicare Other

## 2015-01-18 VITALS — BP 114/70 | HR 73 | Ht 59.0 in | Wt 112.2 lb

## 2015-01-18 DIAGNOSIS — J301 Allergic rhinitis due to pollen: Secondary | ICD-10-CM

## 2015-01-18 DIAGNOSIS — J309 Allergic rhinitis, unspecified: Secondary | ICD-10-CM | POA: Diagnosis not present

## 2015-01-18 MED ORDER — AZITHROMYCIN 250 MG PO TABS: ORAL_TABLET | ORAL | Status: DC

## 2015-01-18 MED ORDER — FLUCONAZOLE 150 MG PO TABS: 150.0000 mg | ORAL_TABLET | Freq: Every day | ORAL | Status: DC

## 2015-01-18 NOTE — Progress Notes (Signed)
12/15/11- 79 yoFnever smoker followed for allergic rhinitis  PCP Dr Wylene Simmerisovec LOV- 11/04/10 She feels she is doing well. For the past 2 days has had increased nasal congestion and discharge has a few blood streaks. Throat tickle from postnasal drip. Denies sore throat, fever, history of asthma. Continues allergy vaccine 1:10 here without problems. Taking Zyrtec once or twice a week.  10/15/11- 79 yoFnever smoker followed for allergic rhinitis  PCP Dr Wylene Simmerisovec FOLLOWS FOR: runny nose; PND in throat. Allergy vaccine 1:10 [facility] continues to be helpful. Some rhinorrhea, watery clear. She changed Zyrtec to [employer]ra.  04/11/13- 81 yoFnever smoker followed for allergic rhinitis  PCP Dr Wylene Simmerisovec FOLLOWS ONG:EXBMWFOR:doing good,runny nose at times-clear,pnd,occass. dry hacky cough,allergy shots doing well. She believes vaccine worth continuing. Peak Spring pollen added Allegra/ Flonase/ Astelin. Never wheeze.  04/10/14- 82  yoFnever smoker followed for allergic rhinitis  PCP Dr Wylene Simmerisovec FOLLOWS FOR:  Allergy Vaccine 1:10 [facility] doing well.  Reports doing well has slight runny nose and some pnd. Using Allegra and Flonase. No asthma. Satisfied with care  01/18/15- 82  yoFnever smoker followed for allergic rhinitis  PCP Dr Wylene Simmerisovec ACUTE VISIT: Pt states she continues to have alot of drainage with cough since getting Doxycycline last week from our office. Allergy vaccine 1:10 [facility]    ROS-see HPI Constitutional:   No-   weight loss, night sweats, fevers, chills, fatigue, lassitude. HEENT:   No-  headaches, difficulty swallowing, tooth/dental problems, sore throat,      No- sneezing, itching, ear ache, +nasal congestion, +post nasal drip,  CV:  No-   chest pain, orthopnea, PND, swelling in lower extremities, anasarca,dizziness, palpitations Resp: No-   shortness of breath with exertion or at rest.   No-   productive cough,  No non-productive cough,  No- coughing up of blood.              No-   change in color of mucus.  No-  wheezing.   Skin: No-   rash or lesions. GI:  No-   heartburn, indigestion, abdominal pain, nausea, vomiting,  GU:  MS:  No-   joint pain or swelling.  Neuro-     nothing unusual Psych:  No- change in mood or affect. No depression or anxiety.  No memory loss.  OBJ- Physical Exam General- Alert, Oriented, Affect-appropriate, Distress- none acute Skin- rash-none, lesions- none, excoriation- none Lymphadenopathy- none Head- atraumatic            Eyes- Gross vision intact, PERRLA, conjunctivae and secretions clear            Ears- Hearing, canals-normal            Nose-  clear, no-Septal dev,  polyps, erosion, perforation. +Mucosa pale.            Throat- Mallampati III-IV , mucosa clear , drainage- none, tonsils- atrophic Neck- flexible , trachea midline, no stridor , thyroid nl, carotid no bruit Chest - symmetrical excursion , unlabored           Heart/CV- RRR, occasional skip , no murmur , no gallop  , no rub, nl s1 s2                           - JVD- none , edema- none, stasis changes- none, varices- none           Lung- clear to P&A, wheeze- none, cough- none , dullness-none, rub- none  Chest wall-  Abd-  Br/ Gen/ Rectal- Not done, not indicated Extrem- cyanosis- none, clubbing, none, atrophy- none, strength- nl Neuro- grossly intact to observation

## 2015-01-18 NOTE — Patient Instructions (Addendum)
Script sent for Zpak  Ok to take otc Mucinex-DM or Delsym for cough if needed   [facility]  Please call if needed   Keep next appointment in July

## 2015-01-22 ENCOUNTER — Ambulatory Visit (INDEPENDENT_AMBULATORY_CARE_PROVIDER_SITE_OTHER): Payer: Medicare Other

## 2015-01-22 ENCOUNTER — Other Ambulatory Visit: Payer: Self-pay | Admitting: Interventional Cardiology

## 2015-01-22 DIAGNOSIS — J309 Allergic rhinitis, unspecified: Secondary | ICD-10-CM | POA: Diagnosis not present

## 2015-01-24 ENCOUNTER — Telehealth: Payer: Self-pay | Admitting: Internal Medicine

## 2015-01-24 NOTE — Telephone Encounter (Signed)
Patient notified.  Patient will call back if not getting better. Nothing further needed.

## 2015-01-24 NOTE — Telephone Encounter (Signed)
Let us know if it persists. This might or might not be related to the medicine.

## 2015-01-24 NOTE — Telephone Encounter (Signed)
Patient says there are red spots on body, not itchy, no swelling.  Just finished Zpak and Diflucan.  Patient has been taking Benadryl, thinks it helped a lot, but she still has the blotches all over her.  She says that she had taken Diflucan and Zpak in the past and didn't have any trouble with it.  She says that the only thing she is allergic to that she knows of is PCN.    Current Outpatient Prescriptions on File Prior to Visit  Medication Sig Dispense Refill  . AMBULATORY NON FORMULARY MEDICATION Allergy injections once a week on Wednesdays    . amitriptyline (ELAVIL) 25 MG tablet Take 25 mg by mouth at bedtime.     Marland Kitchen. aspirin 81 MG EC tablet Take 81 mg by mouth daily.      Marland Kitchen. atenolol (TENORMIN) 50 MG tablet TAKE 1 TABLET BY MOUTH TWICE A DAY 180 tablet 0  . azithromycin (ZITHROMAX) 250 MG tablet 2 today then one daily 6 each 0  . calcium citrate-vitamin D 200-200 MG-UNIT TABS Take 1 tablet by mouth 2 (two) times daily.     . chlorthalidone (HYGROTON) 25 MG tablet TAKE 1 TABLET BY MOUTH EVERY DAY 90 tablet 3  . Cholecalciferol (VITAMIN D3) 1000 UNITS CAPS Take 1 tablet by mouth daily.      . cilostazol (PLETAL) 100 MG tablet Take 100 mg by mouth 2 (two) times daily.     Marland Kitchen. ESTRACE VAGINAL 0.1 MG/GM vaginal cream Place 1 Applicatorful vaginally 2 (two) times a week.     . fexofenadine (ALLEGRA) 180 MG tablet Take 180 mg by mouth daily.    . fluconazole (DIFLUCAN) 150 MG tablet Take 1 tablet (150 mg total) by mouth daily. 3 tablet 0  . fluticasone (FLONASE) 50 MCG/ACT nasal spray Place 2 sprays into both nostrils at bedtime as needed for allergies or rhinitis.    Marland Kitchen. KLOR-CON M20 20 MEQ tablet TAKE 1 TABLET BY MOUTH EVERY DAY 90 tablet 2  . Multiple Vitamin (MULTIVITAMIN) tablet Take 1 tablet by mouth daily.      Bertram Gala. Polyethyl Glycol-Propyl Glycol (SYSTANE) 0.4-0.3 % SOLN Apply 1 drop to eye 2 (two) times daily as needed (dry eyes).     . pravastatin (PRAVACHOL) 40 MG tablet Take 40 mg by mouth daily.       . zoledronic acid (RECLAST) 5 MG/100ML SOLN Inject 5 mg into the vein. Once per year.     . [DISCONTINUED] famotidine (PEPCID) 20 MG tablet Take 20 mg by mouth daily.      . [DISCONTINUED] Potassium Chloride (KLOR-CON 10 PO) Take 10 mEq by mouth daily.       No current facility-administered medications on file prior to visit.   Allergies  Allergen Reactions  . Penicillins Hives

## 2015-01-29 ENCOUNTER — Ambulatory Visit (INDEPENDENT_AMBULATORY_CARE_PROVIDER_SITE_OTHER): Payer: Medicare Other

## 2015-01-29 DIAGNOSIS — J309 Allergic rhinitis, unspecified: Secondary | ICD-10-CM

## 2015-02-05 ENCOUNTER — Ambulatory Visit: Payer: Medicare Other

## 2015-02-06 ENCOUNTER — Ambulatory Visit (INDEPENDENT_AMBULATORY_CARE_PROVIDER_SITE_OTHER): Payer: Medicare Other

## 2015-02-06 DIAGNOSIS — J309 Allergic rhinitis, unspecified: Secondary | ICD-10-CM | POA: Diagnosis not present

## 2015-02-12 ENCOUNTER — Ambulatory Visit (INDEPENDENT_AMBULATORY_CARE_PROVIDER_SITE_OTHER): Payer: Medicare Other

## 2015-02-12 DIAGNOSIS — J309 Allergic rhinitis, unspecified: Secondary | ICD-10-CM

## 2015-02-19 ENCOUNTER — Ambulatory Visit (INDEPENDENT_AMBULATORY_CARE_PROVIDER_SITE_OTHER): Payer: Medicare Other

## 2015-02-19 DIAGNOSIS — J309 Allergic rhinitis, unspecified: Secondary | ICD-10-CM

## 2015-02-26 ENCOUNTER — Ambulatory Visit (INDEPENDENT_AMBULATORY_CARE_PROVIDER_SITE_OTHER): Payer: Medicare Other

## 2015-02-26 DIAGNOSIS — J309 Allergic rhinitis, unspecified: Secondary | ICD-10-CM | POA: Diagnosis not present

## 2015-02-28 ENCOUNTER — Encounter: Payer: Self-pay | Admitting: Internal Medicine

## 2015-02-28 ENCOUNTER — Other Ambulatory Visit: Payer: Self-pay | Admitting: Interventional Cardiology

## 2015-03-01 ENCOUNTER — Other Ambulatory Visit: Payer: Self-pay | Admitting: Interventional Cardiology

## 2015-03-05 ENCOUNTER — Ambulatory Visit (INDEPENDENT_AMBULATORY_CARE_PROVIDER_SITE_OTHER): Payer: Medicare Other

## 2015-03-05 DIAGNOSIS — J309 Allergic rhinitis, unspecified: Secondary | ICD-10-CM

## 2015-03-12 ENCOUNTER — Ambulatory Visit (INDEPENDENT_AMBULATORY_CARE_PROVIDER_SITE_OTHER): Payer: Medicare Other

## 2015-03-12 DIAGNOSIS — J309 Allergic rhinitis, unspecified: Secondary | ICD-10-CM

## 2015-03-19 ENCOUNTER — Ambulatory Visit (INDEPENDENT_AMBULATORY_CARE_PROVIDER_SITE_OTHER): Payer: Medicare Other

## 2015-03-19 DIAGNOSIS — J309 Allergic rhinitis, unspecified: Secondary | ICD-10-CM | POA: Diagnosis not present

## 2015-04-01 ENCOUNTER — Encounter (HOSPITAL_COMMUNITY): Payer: Self-pay

## 2015-04-01 ENCOUNTER — Encounter (HOSPITAL_COMMUNITY)
Admission: RE | Admit: 2015-04-01 | Discharge: 2015-04-01 | Disposition: A | Discharge status: Home / Self Care | Payer: Medicare Other | Source: Ambulatory Visit | Attending: Internal Medicine | Admitting: Internal Medicine

## 2015-04-01 ENCOUNTER — Ambulatory Visit (INDEPENDENT_AMBULATORY_CARE_PROVIDER_SITE_OTHER): Payer: Medicare Other

## 2015-04-01 ENCOUNTER — Other Ambulatory Visit (HOSPITAL_COMMUNITY): Payer: Self-pay | Admitting: Internal Medicine

## 2015-04-01 DIAGNOSIS — M810 Age-related osteoporosis without current pathological fracture: Secondary | ICD-10-CM | POA: Diagnosis present

## 2015-04-01 DIAGNOSIS — J309 Allergic rhinitis, unspecified: Secondary | ICD-10-CM

## 2015-04-01 MED ORDER — ZOLEDRONIC ACID 5 MG/100ML IV SOLN
5.0000 mg | Freq: Once | INTRAVENOUS | Status: AC
  Administered 2015-04-01: 5 mg via INTRAVENOUS
  Filled 2015-04-01: qty 100

## 2015-04-01 MED ORDER — SODIUM CHLORIDE 0.9 % IV SOLN
Freq: Once | INTRAVENOUS | Status: AC
  Administered 2015-04-01: 09:00:00 via INTRAVENOUS

## 2015-04-01 NOTE — Discharge Instructions (Signed)

## 2015-04-09 ENCOUNTER — Ambulatory Visit: Payer: Medicare Other

## 2015-04-11 ENCOUNTER — Ambulatory Visit (INDEPENDENT_AMBULATORY_CARE_PROVIDER_SITE_OTHER): Payer: Medicare Other | Admitting: Internal Medicine

## 2015-04-11 ENCOUNTER — Encounter: Payer: Self-pay | Admitting: Internal Medicine

## 2015-04-11 ENCOUNTER — Ambulatory Visit (INDEPENDENT_AMBULATORY_CARE_PROVIDER_SITE_OTHER): Payer: Medicare Other

## 2015-04-11 VITALS — BP 110/52 | HR 70 | Ht 59.0 in | Wt 111.6 lb

## 2015-04-11 DIAGNOSIS — J309 Allergic rhinitis, unspecified: Secondary | ICD-10-CM

## 2015-04-11 DIAGNOSIS — J301 Allergic rhinitis due to pollen: Secondary | ICD-10-CM

## 2015-04-11 NOTE — Patient Instructions (Signed)
We can continue allergy vaccine 1:10 [facility]  For the runny nose and post-nasal drip, try using your Flonase/ fluticasone nasal spray 1-2 puffs each nostril at bedtime. Try doing this for a week to see if it makes a difference.

## 2015-04-11 NOTE — Progress Notes (Signed)
12/15/11- 79 yoFnever smoker followed for allergic rhinitis  PCP Dr Wylene Simmer LOV- 11/04/10 She feels she is doing well. For the past 2 days has had increased nasal congestion and discharge has a few blood streaks. Throat tickle from postnasal drip. Denies sore throat, fever, history of asthma. Continues allergy vaccine 1:10 here without problems. Taking Zyrtec once or twice a week.  10/15/11- 79 yoFnever smoker followed for allergic rhinitis  PCP Dr Wylene Simmer FOLLOWS FOR: runny nose; PND in throat. Allergy vaccine 1:10 [facility] continues to be helpful. Some rhinorrhea, watery clear. She changed Zyrtec to [employer].  04/11/13- 81 yoFnever smoker followed for allergic rhinitis  PCP Dr Wylene Simmer FOLLOWS ZOX:WRUEA good,runny nose at times-clear,pnd,occass. dry hacky cough,allergy shots doing well. She believes vaccine worth continuing. Peak Spring pollen added Allegra/ Flonase/ Astelin. Never wheeze.  04/10/14- 82  yoFnever smoker followed for allergic rhinitis  PCP Dr Wylene Simmer FOLLOWS FOR:  Allergy Vaccine 1:10 [facility] doing well.  Reports doing well has slight runny nose and some pnd. Using Allegra and Flonase. No asthma. Satisfied with care  01/18/15- 82  yoFnever smoker followed for allergic rhinitis  PCP Dr Wylene Simmer ACUTE VISIT: Pt states she continues to have alot of drainage with cough since getting Doxycycline last week from our office. Allergy vaccine 1:10 [facility]  04/11/15- 82  yoFnever smoker followed for allergic rhinitis  PCP Dr Wylene Simmer FOLLOWS FOR: Pt states she is having PND and dry cough. Pt states overall she feels she is doing well. Pt continues to use allergy vaccine and denies complaints. Pt denies SOB, f/c/s, CP/tightness.  Allergy vaccine 1:10 [facility] Some postnasal drip especially in the mornings. Benadryl helps for occasional use and she does not feel it makes her sleepy.  ROS-see HPI Constitutional:   No-   weight loss, night sweats, fevers, chills, fatigue, lassitude. HEENT:   No-  headaches, difficulty  swallowing, tooth/dental problems, sore throat,      No- sneezing, itching, ear ache, nasal congestion, +post nasal drip,  CV:  No-   chest pain, orthopnea, PND, swelling in lower extremities, anasarca,dizziness, palpitations Resp: No-   shortness of breath with exertion or at rest.   No-   productive cough,  No non-productive cough,  No- coughing up of blood.              No-   change in color of mucus.  No- wheezing.   Skin: No-   rash or lesions. GI:  No-   heartburn, indigestion, abdominal pain, nausea, vomiting,  GU:  MS:  No-   joint pain or swelling.  Neuro-     nothing unusual Psych:  No- change in mood or affect. No depression or anxiety.  No memory loss.  OBJ- Physical Exam General- Alert, Oriented, Affect-appropriate, Distress- none acute Skin- rash-none, lesions- none, excoriation- none Lymphadenopathy- none Head- atraumatic            Eyes- Gross vision intact, PERRLA, conjunctivae and secretions clear            Ears- Hearing, canals-normal            Nose-  clear, no-Septal dev,  polyps, erosion, perforation. +Mucosa pale.            Throat- Mallampati III-IV , mucosa clear , drainage- none, tonsils- atrophic Neck- flexible , trachea midline, no stridor , thyroid nl, carotid no bruit Chest - symmetrical excursion , unlabored           Heart/CV- RRR, occasional skip , no murmur ,  no Sibrian  , no rub, nl s1 s2                           - JVD- none , edema- none, stasis changes- none, varices- none           Lung- clear to P&A, wheeze- none, cough- none , dullness-none, rub- none           Chest wall-  Abd-  Br/ Gen/ Rectal- Not done, not indicated Extrem- cyanosis- none, clubbing, none, atrophy- none, strength- nl Neuro- grossly intact to observation

## 2015-04-13 NOTE — Assessment & Plan Note (Signed)
She can continue allergy vaccine for another year Plan-try using her Flonase regularly one or 2 puffs every night instead of when necessary

## 2015-04-16 ENCOUNTER — Ambulatory Visit (INDEPENDENT_AMBULATORY_CARE_PROVIDER_SITE_OTHER): Payer: Medicare Other

## 2015-04-16 DIAGNOSIS — J309 Allergic rhinitis, unspecified: Secondary | ICD-10-CM | POA: Diagnosis not present

## 2015-04-17 ENCOUNTER — Ambulatory Visit: Payer: Medicare Other

## 2015-04-23 ENCOUNTER — Ambulatory Visit (INDEPENDENT_AMBULATORY_CARE_PROVIDER_SITE_OTHER): Payer: Medicare Other

## 2015-04-23 DIAGNOSIS — J309 Allergic rhinitis, unspecified: Secondary | ICD-10-CM | POA: Diagnosis not present

## 2015-05-07 ENCOUNTER — Ambulatory Visit (INDEPENDENT_AMBULATORY_CARE_PROVIDER_SITE_OTHER): Payer: Medicare Other

## 2015-05-07 DIAGNOSIS — J309 Allergic rhinitis, unspecified: Secondary | ICD-10-CM | POA: Diagnosis not present

## 2015-05-14 ENCOUNTER — Ambulatory Visit (INDEPENDENT_AMBULATORY_CARE_PROVIDER_SITE_OTHER): Payer: Medicare Other

## 2015-05-14 DIAGNOSIS — J309 Allergic rhinitis, unspecified: Secondary | ICD-10-CM | POA: Diagnosis not present

## 2015-05-16 ENCOUNTER — Encounter: Payer: Self-pay | Admitting: Internal Medicine

## 2015-05-21 ENCOUNTER — Ambulatory Visit (INDEPENDENT_AMBULATORY_CARE_PROVIDER_SITE_OTHER): Payer: Medicare Other

## 2015-05-21 DIAGNOSIS — J309 Allergic rhinitis, unspecified: Secondary | ICD-10-CM | POA: Diagnosis not present

## 2015-05-28 ENCOUNTER — Ambulatory Visit (INDEPENDENT_AMBULATORY_CARE_PROVIDER_SITE_OTHER): Payer: Medicare Other

## 2015-05-28 DIAGNOSIS — J309 Allergic rhinitis, unspecified: Secondary | ICD-10-CM

## 2015-06-03 ENCOUNTER — Telehealth: Payer: Self-pay | Admitting: Internal Medicine

## 2015-06-03 ENCOUNTER — Ambulatory Visit (INDEPENDENT_AMBULATORY_CARE_PROVIDER_SITE_OTHER): Payer: Medicare Other

## 2015-06-03 DIAGNOSIS — J309 Allergic rhinitis, unspecified: Secondary | ICD-10-CM | POA: Diagnosis not present

## 2015-06-03 MED ORDER — PREDNISONE 20 MG PO TABS: 20.0000 mg | ORAL_TABLET | Freq: Every day | ORAL | Status: DC

## 2015-06-03 MED ORDER — AZITHROMYCIN 250 MG PO TABS: ORAL_TABLET | ORAL | Status: DC

## 2015-06-03 NOTE — Telephone Encounter (Signed)
Offer Z pak  And prednisone 20 mg, # 4, 1 daily

## 2015-06-03 NOTE — Telephone Encounter (Signed)
Called and spoke to pt. Informed her of the recs per CY. Rx sent to preferred pharmacy. Pt verbalized understanding and denied any further questions or concerns at this time.   

## 2015-06-03 NOTE — Telephone Encounter (Signed)
Called and spoke to pt. Pt c/o increase in non prod cough with chest congestion, sore throat, PND x 1 week. Pt denies SOB, CP/tightness, f/c/s, n/v/d. Pt states she is taking mucinex without relief and delsym which seems to be slightly helping suppress cough.   Dr. Maple Hudson please advise. Thanks.   Allergies  Allergen Reactions  . Penicillins Hives    Current Outpatient Prescriptions on File Prior to Visit  Medication Sig Dispense Refill  . AMBULATORY NON FORMULARY MEDICATION Allergy injections once a week on Wednesdays    . amitriptyline (ELAVIL) 25 MG tablet Take 25 mg by mouth at bedtime.     Marland Kitchen aspirin 81 MG EC tablet Take 81 mg by mouth daily.      Marland Kitchen atenolol (TENORMIN) 50 MG tablet TAKE 1 TABLET BY MOUTH TWICE A DAY 180 tablet 1  . calcium citrate-vitamin D 200-200 MG-UNIT TABS Take 1 tablet by mouth 2 (two) times daily.     . chlorthalidone (HYGROTON) 25 MG tablet TAKE 1 TABLET BY MOUTH EVERY DAY 90 tablet 3  . Cholecalciferol (VITAMIN D3) 1000 UNITS CAPS Take 1 tablet by mouth daily.      . cilostazol (PLETAL) 100 MG tablet Take 100 mg by mouth 2 (two) times daily.     . diphenhydrAMINE (SOMINEX) 25 MG tablet Take 25 mg by mouth as needed for sleep.    Marland Kitchen ESTRACE VAGINAL 0.1 MG/GM vaginal cream Place 1 Applicatorful vaginally 2 (two) times a week.     . fluticasone (FLONASE) 50 MCG/ACT nasal spray Place 2 sprays into both nostrils at bedtime as needed for allergies or rhinitis.    Marland Kitchen KLOR-CON M20 20 MEQ tablet TAKE 1 TABLET BY MOUTH EVERY DAY 90 tablet 2  . Multiple Vitamin (MULTIVITAMIN) tablet Take 1 tablet by mouth daily.      . NONFORMULARY OR COMPOUNDED ITEM Allergy Vaccine 1:10 Given at [facility]    . Polyethyl Glycol-Propyl Glycol (SYSTANE) 0.4-0.3 % SOLN Apply 1 drop to eye 2 (two) times daily as needed (dry eyes).     . pravastatin (PRAVACHOL) 40 MG tablet Take 40 mg by mouth daily.      . zoledronic acid (RECLAST) 5 MG/100ML SOLN Inject 5 mg into the vein. Once per  year.     . [DISCONTINUED] famotidine (PEPCID) 20 MG tablet Take 20 mg by mouth daily.      . [DISCONTINUED] Potassium Chloride (KLOR-CON 10 PO) Take 10 mEq by mouth daily.       No current facility-administered medications on file prior to visit.

## 2015-06-04 ENCOUNTER — Ambulatory Visit: Payer: Medicare Other

## 2015-06-04 ENCOUNTER — Telehealth: Payer: Self-pay | Admitting: Internal Medicine

## 2015-06-04 NOTE — Telephone Encounter (Signed)
Date Mixed: 06/03/2015 Vial: A Strength: 1:10 Here/Mail/Pick Up: Here Mixed By: Lindsay Lemons, CMA 

## 2015-06-06 ENCOUNTER — Ambulatory Visit (INDEPENDENT_AMBULATORY_CARE_PROVIDER_SITE_OTHER): Payer: Medicare Other

## 2015-06-06 DIAGNOSIS — J309 Allergic rhinitis, unspecified: Secondary | ICD-10-CM

## 2015-06-11 ENCOUNTER — Ambulatory Visit (INDEPENDENT_AMBULATORY_CARE_PROVIDER_SITE_OTHER): Payer: Medicare Other

## 2015-06-11 DIAGNOSIS — J309 Allergic rhinitis, unspecified: Secondary | ICD-10-CM

## 2015-06-18 ENCOUNTER — Ambulatory Visit (INDEPENDENT_AMBULATORY_CARE_PROVIDER_SITE_OTHER): Payer: Medicare Other

## 2015-06-18 DIAGNOSIS — J309 Allergic rhinitis, unspecified: Secondary | ICD-10-CM

## 2015-06-25 ENCOUNTER — Ambulatory Visit (INDEPENDENT_AMBULATORY_CARE_PROVIDER_SITE_OTHER): Payer: Medicare Other

## 2015-06-25 DIAGNOSIS — J309 Allergic rhinitis, unspecified: Secondary | ICD-10-CM

## 2015-07-02 ENCOUNTER — Ambulatory Visit (INDEPENDENT_AMBULATORY_CARE_PROVIDER_SITE_OTHER): Payer: Medicare Other

## 2015-07-02 DIAGNOSIS — J309 Allergic rhinitis, unspecified: Secondary | ICD-10-CM | POA: Diagnosis not present

## 2015-07-09 ENCOUNTER — Ambulatory Visit (INDEPENDENT_AMBULATORY_CARE_PROVIDER_SITE_OTHER): Payer: Medicare Other

## 2015-07-09 DIAGNOSIS — J309 Allergic rhinitis, unspecified: Secondary | ICD-10-CM | POA: Diagnosis not present

## 2015-07-16 ENCOUNTER — Ambulatory Visit (INDEPENDENT_AMBULATORY_CARE_PROVIDER_SITE_OTHER): Payer: Medicare Other

## 2015-07-16 DIAGNOSIS — J309 Allergic rhinitis, unspecified: Secondary | ICD-10-CM

## 2015-07-23 ENCOUNTER — Ambulatory Visit (INDEPENDENT_AMBULATORY_CARE_PROVIDER_SITE_OTHER): Payer: Medicare Other

## 2015-07-23 DIAGNOSIS — J309 Allergic rhinitis, unspecified: Secondary | ICD-10-CM | POA: Diagnosis not present

## 2015-07-30 ENCOUNTER — Ambulatory Visit (INDEPENDENT_AMBULATORY_CARE_PROVIDER_SITE_OTHER): Payer: Medicare Other

## 2015-07-30 DIAGNOSIS — J309 Allergic rhinitis, unspecified: Secondary | ICD-10-CM | POA: Diagnosis not present

## 2015-08-06 ENCOUNTER — Ambulatory Visit (INDEPENDENT_AMBULATORY_CARE_PROVIDER_SITE_OTHER): Payer: Medicare Other

## 2015-08-06 DIAGNOSIS — J309 Allergic rhinitis, unspecified: Secondary | ICD-10-CM | POA: Diagnosis not present

## 2015-08-13 ENCOUNTER — Ambulatory Visit (INDEPENDENT_AMBULATORY_CARE_PROVIDER_SITE_OTHER): Payer: Medicare Other

## 2015-08-13 DIAGNOSIS — J309 Allergic rhinitis, unspecified: Secondary | ICD-10-CM | POA: Diagnosis not present

## 2015-08-20 ENCOUNTER — Ambulatory Visit (INDEPENDENT_AMBULATORY_CARE_PROVIDER_SITE_OTHER): Payer: Medicare Other

## 2015-08-20 DIAGNOSIS — J309 Allergic rhinitis, unspecified: Secondary | ICD-10-CM

## 2015-08-22 ENCOUNTER — Other Ambulatory Visit (HOSPITAL_COMMUNITY): Payer: Self-pay | Admitting: *Deleted

## 2015-08-26 ENCOUNTER — Other Ambulatory Visit (HOSPITAL_COMMUNITY): Payer: Self-pay | Admitting: *Deleted

## 2015-08-27 ENCOUNTER — Ambulatory Visit: Payer: Medicare Other

## 2015-08-28 ENCOUNTER — Ambulatory Visit (INDEPENDENT_AMBULATORY_CARE_PROVIDER_SITE_OTHER): Payer: Medicare Other

## 2015-08-28 ENCOUNTER — Other Ambulatory Visit: Payer: Self-pay | Admitting: Interventional Cardiology

## 2015-08-28 DIAGNOSIS — J309 Allergic rhinitis, unspecified: Secondary | ICD-10-CM

## 2015-09-03 ENCOUNTER — Ambulatory Visit (INDEPENDENT_AMBULATORY_CARE_PROVIDER_SITE_OTHER): Payer: Medicare Other

## 2015-09-03 DIAGNOSIS — J309 Allergic rhinitis, unspecified: Secondary | ICD-10-CM

## 2015-09-10 ENCOUNTER — Ambulatory Visit (INDEPENDENT_AMBULATORY_CARE_PROVIDER_SITE_OTHER): Payer: Medicare Other

## 2015-09-10 DIAGNOSIS — J309 Allergic rhinitis, unspecified: Secondary | ICD-10-CM | POA: Diagnosis not present

## 2015-09-17 ENCOUNTER — Ambulatory Visit (INDEPENDENT_AMBULATORY_CARE_PROVIDER_SITE_OTHER): Payer: Medicare Other

## 2015-09-17 DIAGNOSIS — J309 Allergic rhinitis, unspecified: Secondary | ICD-10-CM | POA: Diagnosis not present

## 2015-09-19 ENCOUNTER — Other Ambulatory Visit: Payer: Self-pay | Admitting: *Deleted

## 2015-09-19 MED ORDER — POTASSIUM CHLORIDE CRYS ER 20 MEQ PO TBCR: 20.0000 meq | EXTENDED_RELEASE_TABLET | Freq: Every day | ORAL | Status: DC

## 2015-09-24 ENCOUNTER — Other Ambulatory Visit: Payer: Self-pay

## 2015-09-24 ENCOUNTER — Ambulatory Visit (INDEPENDENT_AMBULATORY_CARE_PROVIDER_SITE_OTHER): Payer: Medicare Other

## 2015-09-24 DIAGNOSIS — J309 Allergic rhinitis, unspecified: Secondary | ICD-10-CM | POA: Diagnosis not present

## 2015-09-25 ENCOUNTER — Telehealth: Payer: Self-pay | Admitting: Internal Medicine

## 2015-09-25 DIAGNOSIS — J309 Allergic rhinitis, unspecified: Secondary | ICD-10-CM | POA: Diagnosis not present

## 2015-09-25 NOTE — Telephone Encounter (Signed)
Allergy Serum Extract Date Mixed: 09/25/2015 Vial: A Strength: 1:10 Here/Mail/Pick Up: Here Mixed By: Jaynee EaglesLindsay Adolfo Granieri, CMA Last OV: 04/11/2015 Pending OV: 04/13/2016

## 2015-10-01 ENCOUNTER — Ambulatory Visit: Payer: Medicare Other

## 2015-10-03 ENCOUNTER — Ambulatory Visit (INDEPENDENT_AMBULATORY_CARE_PROVIDER_SITE_OTHER): Payer: Medicare Other

## 2015-10-03 DIAGNOSIS — J309 Allergic rhinitis, unspecified: Secondary | ICD-10-CM

## 2015-10-09 ENCOUNTER — Ambulatory Visit: Payer: Medicare Other

## 2015-10-11 ENCOUNTER — Other Ambulatory Visit: Payer: Self-pay | Admitting: *Deleted

## 2015-10-11 MED ORDER — CHLORTHALIDONE 25 MG PO TABS: 25.0000 mg | ORAL_TABLET | Freq: Every day | ORAL | Status: DC

## 2015-10-11 MED ORDER — POTASSIUM CHLORIDE CRYS ER 20 MEQ PO TBCR: 20.0000 meq | EXTENDED_RELEASE_TABLET | Freq: Every day | ORAL | Status: DC

## 2015-10-11 MED ORDER — ATENOLOL 50 MG PO TABS: 50.0000 mg | ORAL_TABLET | Freq: Two times a day (BID) | ORAL | Status: DC

## 2015-10-15 ENCOUNTER — Ambulatory Visit (INDEPENDENT_AMBULATORY_CARE_PROVIDER_SITE_OTHER): Payer: Medicare Other

## 2015-10-15 DIAGNOSIS — J309 Allergic rhinitis, unspecified: Secondary | ICD-10-CM

## 2015-10-22 ENCOUNTER — Ambulatory Visit (INDEPENDENT_AMBULATORY_CARE_PROVIDER_SITE_OTHER): Payer: Medicare Other

## 2015-10-22 DIAGNOSIS — J309 Allergic rhinitis, unspecified: Secondary | ICD-10-CM | POA: Diagnosis not present

## 2015-10-29 ENCOUNTER — Ambulatory Visit (INDEPENDENT_AMBULATORY_CARE_PROVIDER_SITE_OTHER): Payer: Medicare Other

## 2015-10-29 DIAGNOSIS — J309 Allergic rhinitis, unspecified: Secondary | ICD-10-CM | POA: Diagnosis not present

## 2015-11-04 ENCOUNTER — Ambulatory Visit (INDEPENDENT_AMBULATORY_CARE_PROVIDER_SITE_OTHER): Payer: Medicare Other

## 2015-11-04 DIAGNOSIS — J309 Allergic rhinitis, unspecified: Secondary | ICD-10-CM

## 2015-11-11 ENCOUNTER — Ambulatory Visit (INDEPENDENT_AMBULATORY_CARE_PROVIDER_SITE_OTHER): Payer: Medicare Other

## 2015-11-11 DIAGNOSIS — J309 Allergic rhinitis, unspecified: Secondary | ICD-10-CM | POA: Diagnosis not present

## 2015-11-13 ENCOUNTER — Encounter: Payer: Self-pay | Admitting: Internal Medicine

## 2015-11-18 ENCOUNTER — Ambulatory Visit (INDEPENDENT_AMBULATORY_CARE_PROVIDER_SITE_OTHER): Payer: Medicare Other

## 2015-11-18 DIAGNOSIS — J309 Allergic rhinitis, unspecified: Secondary | ICD-10-CM | POA: Diagnosis not present

## 2015-11-25 ENCOUNTER — Ambulatory Visit (INDEPENDENT_AMBULATORY_CARE_PROVIDER_SITE_OTHER): Payer: Medicare Other | Admitting: *Deleted

## 2015-11-25 DIAGNOSIS — J309 Allergic rhinitis, unspecified: Secondary | ICD-10-CM | POA: Diagnosis not present

## 2015-11-29 ENCOUNTER — Other Ambulatory Visit: Payer: Self-pay | Admitting: Interventional Cardiology

## 2015-12-02 ENCOUNTER — Ambulatory Visit (INDEPENDENT_AMBULATORY_CARE_PROVIDER_SITE_OTHER): Payer: Medicare Other | Admitting: *Deleted

## 2015-12-02 DIAGNOSIS — J309 Allergic rhinitis, unspecified: Secondary | ICD-10-CM

## 2015-12-09 ENCOUNTER — Ambulatory Visit (INDEPENDENT_AMBULATORY_CARE_PROVIDER_SITE_OTHER): Payer: Medicare Other | Admitting: *Deleted

## 2015-12-09 DIAGNOSIS — J309 Allergic rhinitis, unspecified: Secondary | ICD-10-CM | POA: Diagnosis not present

## 2015-12-16 ENCOUNTER — Ambulatory Visit (INDEPENDENT_AMBULATORY_CARE_PROVIDER_SITE_OTHER): Payer: Medicare Other | Admitting: *Deleted

## 2015-12-16 DIAGNOSIS — J309 Allergic rhinitis, unspecified: Secondary | ICD-10-CM | POA: Diagnosis not present

## 2015-12-18 ENCOUNTER — Other Ambulatory Visit: Payer: Self-pay | Admitting: Interventional Cardiology

## 2015-12-20 ENCOUNTER — Other Ambulatory Visit: Payer: Self-pay | Admitting: Interventional Cardiology

## 2015-12-23 ENCOUNTER — Ambulatory Visit (INDEPENDENT_AMBULATORY_CARE_PROVIDER_SITE_OTHER): Payer: Medicare Other | Admitting: *Deleted

## 2015-12-23 DIAGNOSIS — J309 Allergic rhinitis, unspecified: Secondary | ICD-10-CM | POA: Diagnosis not present

## 2015-12-30 ENCOUNTER — Ambulatory Visit (INDEPENDENT_AMBULATORY_CARE_PROVIDER_SITE_OTHER): Payer: Medicare Other | Admitting: *Deleted

## 2015-12-30 DIAGNOSIS — J309 Allergic rhinitis, unspecified: Secondary | ICD-10-CM | POA: Diagnosis not present

## 2016-01-06 ENCOUNTER — Ambulatory Visit (INDEPENDENT_AMBULATORY_CARE_PROVIDER_SITE_OTHER): Payer: Medicare Other | Admitting: *Deleted

## 2016-01-06 DIAGNOSIS — J309 Allergic rhinitis, unspecified: Secondary | ICD-10-CM | POA: Diagnosis not present

## 2016-01-13 ENCOUNTER — Ambulatory Visit: Payer: Medicare Other

## 2016-01-14 ENCOUNTER — Encounter: Payer: Self-pay | Admitting: Interventional Cardiology

## 2016-01-14 ENCOUNTER — Ambulatory Visit (INDEPENDENT_AMBULATORY_CARE_PROVIDER_SITE_OTHER): Payer: Medicare Other | Admitting: Interventional Cardiology

## 2016-01-14 ENCOUNTER — Ambulatory Visit (INDEPENDENT_AMBULATORY_CARE_PROVIDER_SITE_OTHER): Payer: Medicare Other | Admitting: *Deleted

## 2016-01-14 VITALS — BP 130/70 | HR 80 | Ht 59.0 in | Wt 105.4 lb

## 2016-01-14 DIAGNOSIS — I493 Ventricular premature depolarization: Secondary | ICD-10-CM | POA: Diagnosis not present

## 2016-01-14 DIAGNOSIS — I739 Peripheral vascular disease, unspecified: Secondary | ICD-10-CM

## 2016-01-14 DIAGNOSIS — I10 Essential (primary) hypertension: Secondary | ICD-10-CM | POA: Diagnosis not present

## 2016-01-14 DIAGNOSIS — J309 Allergic rhinitis, unspecified: Secondary | ICD-10-CM

## 2016-01-14 NOTE — Progress Notes (Signed)
Patient ID: Victoria Joseph, female   DOB: August 24, 1932, 82 y.o.   MRN: [id number]     Cardiology Office Note   Date:  01/14/2016   ID:  DEONDREA AGUADO, DOB [birth year], MRN [id number]  PCP:  Gaspar Garbe, MD    No chief complaint on file. f/u PVCs, PAD   Wt Readings from Last 3 Encounters:  01/14/16 105 lb 6.4 oz (47.809 kg)  04/11/15 111 lb 9.6 oz (50.621 kg)  04/01/15 110 lb (49.896 kg)       History of Present Illness: Victoria Joseph is a 82 y.o. female  who returns today for evaluation of her PVCs, palpitations, mild mitral regurgitation, and PAD.  She is having very few if any palpitations. She denies any chest pain or angina. She is having no symptoms of presyncope or dizziness.   Her blood work is followed by primary care.   She has a history of PAD diagnosed by Dr. Madilyn Fireman back in early 2000. I have not seen the ultrasound report. She has minimal claudication, if she walks more than a mile. Typically, she walks a half mile for exercise.  Only mild discomfort walking up hill. SHe does not have to stop walking. Other than hypertension and hyperlipidemia, she has no other conventional risk factors. She's had no ulcerations or sores on her feet. She has never smoked and is not diabetic.    Past Medical History  Diagnosis Date  . Hypertension   . Hyperlipidemia   . URI (upper respiratory infection)   . Pneumonia 2009  . Rhinitis, allergic   . Osteoporosis   . Osteoarthritis   . PVD (peripheral vascular disease) (HCC)   . GERD (gastroesophageal reflux disease)   . Diverticulosis   . IBS (irritable bowel syndrome)   . Anxiety   . Sinusitis   . Heart murmur   . Arrhythmia     PVCs  . Hiatal hernia     Past Surgical History  Procedure Laterality Date  . Knee surgery      right repair after fall  . Oophorectomy      still have 1 ovary  . Appendectomy    . Cholecystectomy       Current Outpatient Prescriptions  Medication Sig Dispense Refill  . AMBULATORY  NON FORMULARY MEDICATION Allergy injections once a week on Wednesdays    . amitriptyline (ELAVIL) 25 MG tablet Take 25 mg by mouth at bedtime.     Marland Kitchen aspirin 81 MG EC tablet Take 81 mg by mouth daily.      Marland Kitchen atenolol (TENORMIN) 50 MG tablet Take 1 tablet (50 mg total) by mouth 2 (two) times daily. 180 tablet 0  . calcium citrate-vitamin D 200-200 MG-UNIT TABS Take 1 tablet by mouth 2 (two) times daily.     . chlorthalidone (HYGROTON) 25 MG tablet Take 1 tablet by mouth  daily 90 tablet 0  . Cholecalciferol (VITAMIN D3) 1000 units CAPS Take 1 capsule by mouth daily.     . cilostazol (PLETAL) 100 MG tablet Take 100 mg by mouth 2 (two) times daily.     Marland Kitchen ESTRACE VAGINAL 0.1 MG/GM vaginal cream Place 1 Applicatorful vaginally 2 (two) times a week.     . fluticasone (FLONASE) 50 MCG/ACT nasal spray Place 2 sprays into both nostrils at bedtime as needed for allergies or rhinitis.    Marland Kitchen loratadine (CLARITIN) 10 MG tablet Take 10 mg by mouth daily.    . Misc. Devices (GELLHORN [place])  MISC Place 1 application vaginally every 3 (three) months.     . Multiple Vitamin (MULTIVITAMIN) tablet Take 1 tablet by mouth daily.      . NONFORMULARY OR COMPOUNDED ITEM Allergy Vaccine 1:10 Given at [facility]    . potassium chloride SA (K-DUR,KLOR-CON) 20 MEQ tablet Take 1 tablet by mouth  daily 90 tablet 0  . pravastatin (PRAVACHOL) 40 MG tablet Take 40 mg by mouth daily.      . vitamin C (ASCORBIC ACID) 500 MG tablet Take 500 mg by mouth daily.     . zoledronic acid (RECLAST) 5 MG/100ML SOLN Inject 5 mg into the vein. Once per year.     . [DISCONTINUED] famotidine (PEPCID) 20 MG tablet Take 20 mg by mouth daily.      . [DISCONTINUED] Potassium Chloride (KLOR-CON 10 PO) Take 10 mEq by mouth daily.       No current facility-administered medications for this visit.    Allergies:   Penicillins    Social History:  The patient  reports that she has never smoked. She has never used smokeless tobacco. She  reports that she does not drink alcohol or use illicit drugs.   Family History:  The patient's family history includes Asthma in her paternal grandfather; Heart failure in her mother. There is no history of Colon cancer, Heart attack, Stroke, or Hypertension.    ROS:  Please see the history of present illness.   Otherwise, review of systems are positive for minimal claudication.   All other systems are reviewed and negative.    PHYSICAL EXAM: VS:  BP 130/70 mmHg  Pulse 80  Ht 4\' 11"  (1.499 m)  Wt 105 lb 6.4 oz (47.809 kg)  BMI 21.28 kg/m2 , BMI Body mass index is 21.28 kg/(m^2). GEN: Well nourished, well developed, in no acute distress HEENT: normal Neck: no JVD, carotid bruits, or masses Cardiac: RRR; no murmurs, rubs, or gallops,no edema  Respiratory:  clear to auscultation bilaterally, normal work of breathing GI: soft, nontender, nondistended, + BS MS: no deformity or atrophy Skin: warm and dry, no rash Neuro:  Strength and sensation are intact Psych: euthymic mood, full affect   EKG:   The ekg ordered today demonstrates NSR, no ST segment changes   Recent Labs: No results found for requested labs within last 365 days.   Lipid Panel No results found for: CHOL, TRIG, HDL, CHOLHDL, VLDL, LDLCALC, LDLDIRECT     ASSESSMENT AND PLAN:  1. PAD: No sx of claudication on Pletal. COntinue regular walking, 150 min/week. She gets more than this some weeks. Low risk for limb loss.  Moderately decreased right ABI in 2016.   2. PVCs: No sx at this time. Continue atenolol.  3. HTN: Controlled. Continue chlorthalidone and atenolol. Dr. Wylene Simmerisovec follows blood work.  4.     Hyperlipidemia: LDL target < 100 due to PAD. Continue pravastatin.    Current medicines are reviewed at length with the patient today.  The patient concerns regarding her medicines were addressed.  The following changes have been made:  No change  Labs/ tests ordered today include:  Orders Placed This  Encounter  Procedures  . EKG 12-Lead    Recommend 150 minutes/week of aerobic exercise Low fat, low carb, high fiber diet recommended  Disposition:   FU in 1 year   Delorise JacksonSigned, Leshonda Galambos S., MD  01/14/2016 3:06 PM    [facility] [address], [place], [place]  [address] Phone: [phone];  Fax: [phone]

## 2016-01-14 NOTE — Patient Instructions (Signed)

## 2016-01-20 ENCOUNTER — Ambulatory Visit (INDEPENDENT_AMBULATORY_CARE_PROVIDER_SITE_OTHER): Payer: Medicare Other | Admitting: *Deleted

## 2016-01-20 DIAGNOSIS — J309 Allergic rhinitis, unspecified: Secondary | ICD-10-CM | POA: Diagnosis not present

## 2016-01-21 ENCOUNTER — Telehealth: Payer: Self-pay | Admitting: Internal Medicine

## 2016-01-21 DIAGNOSIS — J309 Allergic rhinitis, unspecified: Secondary | ICD-10-CM | POA: Diagnosis not present

## 2016-01-21 NOTE — Telephone Encounter (Signed)
Allergy Serum Extract Date Mixed: 01/21/2016 Vial: A Strength: 1:10 Here/Mail/Pick Up: Here Mixed By: Victoria Joseph, CMA Last OV: 04/11/2015 Pending OV: 04/16/2016

## 2016-01-27 ENCOUNTER — Other Ambulatory Visit: Payer: Self-pay | Admitting: Interventional Cardiology

## 2016-01-27 ENCOUNTER — Ambulatory Visit (INDEPENDENT_AMBULATORY_CARE_PROVIDER_SITE_OTHER): Payer: Medicare Other | Admitting: *Deleted

## 2016-01-27 DIAGNOSIS — J309 Allergic rhinitis, unspecified: Secondary | ICD-10-CM

## 2016-01-29 NOTE — Telephone Encounter (Signed)
It is ok to refill. Thanks.

## 2016-01-29 NOTE — Telephone Encounter (Signed)
Patient was just seen, but Dr Eldridge DaceVaranasi did not check her potassium level. Please advise on request. Thanks, MI

## 2016-02-03 ENCOUNTER — Ambulatory Visit (INDEPENDENT_AMBULATORY_CARE_PROVIDER_SITE_OTHER): Payer: Medicare Other | Admitting: *Deleted

## 2016-02-03 DIAGNOSIS — J309 Allergic rhinitis, unspecified: Secondary | ICD-10-CM | POA: Diagnosis not present

## 2016-02-06 ENCOUNTER — Encounter: Payer: Self-pay | Admitting: Internal Medicine

## 2016-02-10 ENCOUNTER — Ambulatory Visit (INDEPENDENT_AMBULATORY_CARE_PROVIDER_SITE_OTHER): Payer: Medicare Other | Admitting: *Deleted

## 2016-02-10 DIAGNOSIS — J309 Allergic rhinitis, unspecified: Secondary | ICD-10-CM | POA: Diagnosis not present

## 2016-02-18 ENCOUNTER — Ambulatory Visit (INDEPENDENT_AMBULATORY_CARE_PROVIDER_SITE_OTHER): Payer: Medicare Other | Admitting: *Deleted

## 2016-02-18 ENCOUNTER — Ambulatory Visit: Payer: Medicare Other

## 2016-02-18 DIAGNOSIS — J309 Allergic rhinitis, unspecified: Secondary | ICD-10-CM

## 2016-02-25 ENCOUNTER — Ambulatory Visit (INDEPENDENT_AMBULATORY_CARE_PROVIDER_SITE_OTHER): Payer: Medicare Other | Admitting: *Deleted

## 2016-02-25 DIAGNOSIS — J309 Allergic rhinitis, unspecified: Secondary | ICD-10-CM | POA: Diagnosis not present

## 2016-03-03 ENCOUNTER — Ambulatory Visit (INDEPENDENT_AMBULATORY_CARE_PROVIDER_SITE_OTHER): Payer: Medicare Other | Admitting: *Deleted

## 2016-03-03 DIAGNOSIS — J309 Allergic rhinitis, unspecified: Secondary | ICD-10-CM

## 2016-03-10 ENCOUNTER — Ambulatory Visit: Payer: Medicare Other

## 2016-03-16 ENCOUNTER — Ambulatory Visit (INDEPENDENT_AMBULATORY_CARE_PROVIDER_SITE_OTHER): Payer: Medicare Other

## 2016-03-16 DIAGNOSIS — J309 Allergic rhinitis, unspecified: Secondary | ICD-10-CM | POA: Diagnosis not present

## 2016-03-31 ENCOUNTER — Ambulatory Visit (INDEPENDENT_AMBULATORY_CARE_PROVIDER_SITE_OTHER): Payer: Medicare Other | Admitting: *Deleted

## 2016-03-31 DIAGNOSIS — J309 Allergic rhinitis, unspecified: Secondary | ICD-10-CM

## 2016-04-07 ENCOUNTER — Ambulatory Visit (INDEPENDENT_AMBULATORY_CARE_PROVIDER_SITE_OTHER): Payer: Medicare Other | Admitting: *Deleted

## 2016-04-07 DIAGNOSIS — J309 Allergic rhinitis, unspecified: Secondary | ICD-10-CM

## 2016-04-10 ENCOUNTER — Ambulatory Visit: Payer: Medicare Other | Admitting: Internal Medicine

## 2016-04-13 ENCOUNTER — Ambulatory Visit: Payer: Medicare Other | Admitting: Internal Medicine

## 2016-04-14 ENCOUNTER — Ambulatory Visit (INDEPENDENT_AMBULATORY_CARE_PROVIDER_SITE_OTHER): Payer: Medicare Other

## 2016-04-14 DIAGNOSIS — J309 Allergic rhinitis, unspecified: Secondary | ICD-10-CM | POA: Diagnosis not present

## 2016-04-15 ENCOUNTER — Ambulatory Visit (HOSPITAL_COMMUNITY): Payer: Medicare Other

## 2016-04-16 ENCOUNTER — Ambulatory Visit (INDEPENDENT_AMBULATORY_CARE_PROVIDER_SITE_OTHER): Payer: Medicare Other | Admitting: Internal Medicine

## 2016-04-16 ENCOUNTER — Encounter: Payer: Self-pay | Admitting: Internal Medicine

## 2016-04-16 DIAGNOSIS — J301 Allergic rhinitis due to pollen: Secondary | ICD-10-CM | POA: Diagnosis not present

## 2016-04-16 NOTE — Patient Instructions (Signed)
We can continue allergy shots through this year, or stop at any time, as discussed.   Fine to use an antihistamine like claritin if needed   Please call if we can help

## 2016-04-16 NOTE — Progress Notes (Signed)
12/15/11- 79 yoFnever smoker followed for allergic rhinitis  PCP Dr Wylene Simmer LOV- 11/04/10 She feels she is doing well. For the past 2 days has had increased nasal congestion and discharge has a few blood streaks. Throat tickle from postnasal drip. Denies sore throat, fever, history of asthma. Continues allergy vaccine 1:10 here without problems. Taking Zyrtec once or twice a week.  10/15/11- 79 yoFnever smoker followed for allergic rhinitis  PCP Dr Wylene Simmer FOLLOWS FOR: runny nose; PND in throat. Allergy vaccine 1:10 GH continues to be helpful. Some rhinorrhea, watery clear. She changed Zyrtec to [employer].  04/11/13- 81 yoFnever smoker followed for allergic rhinitis  PCP Dr Wylene Simmer FOLLOWS VWU:JWJXB good,runny nose at times-clear,pnd,occass. dry hacky cough,allergy shots doing well. She believes vaccine worth continuing. Peak Spring pollen added Allegra/ Flonase/ Astelin. Never wheeze.  04/10/14- 82  yoFnever smoker followed for allergic rhinitis  PCP Dr Wylene Simmer FOLLOWS FOR:  Allergy Vaccine 1:10 GH doing well.  Reports doing well has slight runny nose and some pnd. Using Allegra and Flonase. No asthma. Satisfied with care  01/18/15- 82  yoFnever smoker followed for allergic rhinitis  PCP Dr Wylene Simmer ACUTE VISIT: Pt states she continues to have alot of drainage with cough since getting Doxycycline last week from our office. Allergy vaccine 1:10 GH  04/11/15- 82  yoFnever smoker followed for allergic rhinitis  PCP Dr Wylene Simmer FOLLOWS FOR: Pt states she is having PND and dry cough. Pt states overall she feels she is doing well. Pt continues to use allergy vaccine and denies complaints. Pt denies SOB, f/c/s, CP/tightness.  Allergy vaccine 1:10 GH Some postnasal drip especially in the mornings. Benadryl helps for occasional use and she does not feel it makes her sleepy.  04/16/2016-82 year old female never smoker followed for allergic rhinitis Allergy Vaccine 1:10 GH FOLLOWS FOR: Pt still on vaccine and  denies any concerns at this time.  Has felt nasal symptoms are very well controlled. Did use Claritin in spring. Denies wheeze, cough, sinus infection or itching eyes.  ROS-see HPI Constitutional:   No-   weight loss, night sweats, fevers, chills, fatigue, lassitude. HEENT:   No-  headaches, difficulty swallowing, tooth/dental problems, sore throat,      No- sneezing, itching, ear ache, nasal congestion, +post nasal drip,  CV:  No-   chest pain, orthopnea, PND, swelling in lower extremities, anasarca,dizziness, palpitations Resp: No-   shortness of breath with exertion or at rest.   No-   productive cough,  No non-productive cough,  No- coughing up of blood.              No-   change in color of mucus.  No- wheezing.   Skin: No-   rash or lesions. GI:  No-   heartburn, indigestion, abdominal pain, nausea, vomiting,  GU:  MS:  No-   joint pain or swelling.  Neuro-     nothing unusual Psych:  No- change in mood or affect. No depression or anxiety.  No memory loss.  OBJ- Physical Exam  exam seems unchanged General- Alert, Oriented, Affect-appropriate, Distress- none acute Skin- rash-none, lesions- none, excoriation- none Lymphadenopathy- none Head- atraumatic            Eyes- Gross vision intact, PERRLA, conjunctivae and secretions clear            Ears- Hearing, canals-normal            Nose-  clear, no-Septal dev,  polyps, erosion, perforation. +Mucosa pale.  Throat- Mallampati III-IV , mucosa clear , drainage- none, tonsils- atrophic Neck- flexible , trachea midline, no stridor , thyroid nl, carotid no bruit Chest - symmetrical excursion , unlabored           Heart/CV- RRR, occasional skip , no murmur , no gallop, no rub, nl s1 s2                           - JVD- none , edema- none, stasis changes- none, varices- none           Lung- clear to P&A, wheeze- none, cough- none , dullness-none, rub- none           Chest wall-  Abd-  Br/ Gen/ Rectal- Not done, not  indicated Extrem- cyanosis- none, clubbing, none, atrophy- none, strength- nl Neuro- grossly intact to observation

## 2016-04-20 ENCOUNTER — Ambulatory Visit (INDEPENDENT_AMBULATORY_CARE_PROVIDER_SITE_OTHER): Payer: Medicare Other | Admitting: *Deleted

## 2016-04-20 DIAGNOSIS — J309 Allergic rhinitis, unspecified: Secondary | ICD-10-CM | POA: Diagnosis not present

## 2016-04-21 ENCOUNTER — Encounter (HOSPITAL_COMMUNITY): Payer: Self-pay

## 2016-04-21 ENCOUNTER — Ambulatory Visit (HOSPITAL_COMMUNITY)
Admission: RE | Admit: 2016-04-21 | Discharge: 2016-04-21 | Disposition: A | Discharge status: Home / Self Care | Payer: Medicare Other | Source: Ambulatory Visit | Attending: Internal Medicine | Admitting: Internal Medicine

## 2016-04-21 DIAGNOSIS — M810 Age-related osteoporosis without current pathological fracture: Secondary | ICD-10-CM | POA: Diagnosis not present

## 2016-04-21 MED ORDER — SODIUM CHLORIDE 0.9 % IV SOLN
Freq: Once | INTRAVENOUS | Status: AC
  Administered 2016-04-21: 14:00:00 via INTRAVENOUS

## 2016-04-21 MED ORDER — ZOLEDRONIC ACID 5 MG/100ML IV SOLN
5.0000 mg | Freq: Once | INTRAVENOUS | Status: AC
  Administered 2016-04-21: 5 mg via INTRAVENOUS
  Filled 2016-04-21: qty 100

## 2016-04-21 NOTE — Progress Notes (Signed)
Uneventful reclast infusion.  Pt takes daily calcium and vitamin D.  D/c instructions regarding reclast given to pt.

## 2016-04-21 NOTE — Discharge Instructions (Signed)

## 2016-04-27 ENCOUNTER — Ambulatory Visit (INDEPENDENT_AMBULATORY_CARE_PROVIDER_SITE_OTHER): Payer: Medicare Other | Admitting: *Deleted

## 2016-04-27 DIAGNOSIS — J309 Allergic rhinitis, unspecified: Secondary | ICD-10-CM

## 2016-05-04 ENCOUNTER — Ambulatory Visit: Payer: Medicare Other

## 2016-05-04 ENCOUNTER — Ambulatory Visit (INDEPENDENT_AMBULATORY_CARE_PROVIDER_SITE_OTHER): Payer: Medicare Other | Admitting: *Deleted

## 2016-05-04 DIAGNOSIS — J309 Allergic rhinitis, unspecified: Secondary | ICD-10-CM

## 2016-05-10 NOTE — Assessment & Plan Note (Signed)
We discussed plans to close our allergy clinic this winter. I recommended she plan to stop her allergy shots and weight to observe through season changes. I expect that OTC antihistamine and may be a nasal steroid spray will be sufficient. If necessary, her primary physician could help her get to another allergy practice but hopefully she will do fine.

## 2016-05-11 ENCOUNTER — Ambulatory Visit (INDEPENDENT_AMBULATORY_CARE_PROVIDER_SITE_OTHER): Payer: Medicare Other | Admitting: *Deleted

## 2016-05-11 DIAGNOSIS — J309 Allergic rhinitis, unspecified: Secondary | ICD-10-CM

## 2016-05-11 NOTE — Progress Notes (Signed)
Pt. Is stopping per CY to see how she does.

## 2016-05-18 ENCOUNTER — Ambulatory Visit: Payer: Medicare Other

## 2016-07-14 ENCOUNTER — Other Ambulatory Visit: Payer: Self-pay | Admitting: Internal Medicine

## 2016-07-14 DIAGNOSIS — Z1231 Encounter for screening mammogram for malignant neoplasm of breast: Secondary | ICD-10-CM

## 2016-08-07 ENCOUNTER — Ambulatory Visit: Payer: Medicare Other

## 2016-08-31 ENCOUNTER — Other Ambulatory Visit: Payer: Self-pay | Admitting: Interventional Cardiology

## 2016-09-01 NOTE — Telephone Encounter (Signed)
Medication Detail    Disp Refills Start End   atenolol (TENORMIN) 50 MG tablet 180 tablet 3 01/29/2016    Sig: Take 1 tablet by mouth two times daily   E-Prescribing Status: Receipt confirmed by pharmacy (01/29/2016 3:14 PM EDT)   Pharmacy   [facility] [place] - [phone]

## 2016-09-09 ENCOUNTER — Ambulatory Visit: Payer: Medicare Other

## 2016-09-16 ENCOUNTER — Ambulatory Visit: Payer: Medicare Other

## 2016-10-13 ENCOUNTER — Ambulatory Visit: Payer: Medicare Other

## 2016-10-21 ENCOUNTER — Ambulatory Visit
Admission: RE | Admit: 2016-10-21 | Discharge: 2016-10-21 | Disposition: A | Discharge status: Home / Self Care | Payer: Medicare Other | Source: Ambulatory Visit | Attending: Internal Medicine | Admitting: Internal Medicine

## 2016-10-21 DIAGNOSIS — Z1231 Encounter for screening mammogram for malignant neoplasm of breast: Secondary | ICD-10-CM

## 2016-11-16 ENCOUNTER — Emergency Department (HOSPITAL_COMMUNITY)
Admission: EM | Admit: 2016-11-16 | Discharge: 2016-11-16 | Disposition: A | Discharge status: Home / Self Care | Payer: Medicare Other | Attending: Emergency Medicine | Admitting: Emergency Medicine

## 2016-11-16 ENCOUNTER — Encounter (HOSPITAL_COMMUNITY): Payer: Self-pay | Admitting: Emergency Medicine

## 2016-11-16 DIAGNOSIS — Z7982 Long term (current) use of aspirin: Secondary | ICD-10-CM | POA: Diagnosis not present

## 2016-11-16 DIAGNOSIS — I10 Essential (primary) hypertension: Secondary | ICD-10-CM | POA: Insufficient documentation

## 2016-11-16 DIAGNOSIS — R197 Diarrhea, unspecified: Secondary | ICD-10-CM

## 2016-11-16 LAB — URINALYSIS, ROUTINE W REFLEX MICROSCOPIC
BACTERIA UA: NONE SEEN
Bilirubin Urine: NEGATIVE
Glucose, UA: NEGATIVE mg/dL
Hgb urine dipstick: NEGATIVE
Ketones, ur: NEGATIVE mg/dL
NITRITE: NEGATIVE
PH: 6 (ref 5.0–8.0)
Protein, ur: NEGATIVE mg/dL
SPECIFIC GRAVITY, URINE: 1.011 (ref 1.005–1.030)

## 2016-11-16 LAB — LIPASE, BLOOD: LIPASE: 22 U/L (ref 11–51)

## 2016-11-16 LAB — COMPREHENSIVE METABOLIC PANEL
ALT: 14 U/L (ref 14–54)
ANION GAP: 10 (ref 5–15)
AST: 22 U/L (ref 15–41)
Albumin: 4 g/dL (ref 3.5–5.0)
Alkaline Phosphatase: 67 U/L (ref 38–126)
BUN: 13 mg/dL (ref 6–20)
CHLORIDE: 104 mmol/L (ref 101–111)
CO2: 26 mmol/L (ref 22–32)
CREATININE: 0.74 mg/dL (ref 0.44–1.00)
Calcium: 9.8 mg/dL (ref 8.9–10.3)
Glucose, Bld: 110 mg/dL — ABNORMAL HIGH (ref 65–99)
POTASSIUM: 3.6 mmol/L (ref 3.5–5.1)
SODIUM: 140 mmol/L (ref 135–145)
Total Bilirubin: 0.3 mg/dL (ref 0.3–1.2)
Total Protein: 7 g/dL (ref 6.5–8.1)

## 2016-11-16 LAB — CBC
HCT: 37.4 % (ref 36.0–46.0)
HEMOGLOBIN: 12.6 g/dL (ref 12.0–15.0)
MCH: 28.4 pg (ref 26.0–34.0)
MCHC: 33.7 g/dL (ref 30.0–36.0)
MCV: 84.2 fL (ref 78.0–100.0)
PLATELETS: 396 10*3/uL (ref 150–400)
RBC: 4.44 MIL/uL (ref 3.87–5.11)
RDW: 13.3 % (ref 11.5–15.5)
WBC: 9.3 10*3/uL (ref 4.0–10.5)

## 2016-11-16 LAB — ABO/RH: ABO/RH(D): A POS

## 2016-11-16 LAB — TYPE AND SCREEN
ABO/RH(D): A POS
Antibody Screen: NEGATIVE

## 2016-11-16 LAB — POC OCCULT BLOOD, ED: FECAL OCCULT BLD: NEGATIVE

## 2016-11-16 NOTE — ED Triage Notes (Signed)
Pt reports diarrhea for the past 2 weeks. Saw PCP for same a week and was put on probiotic and flagyl. However today noticed diarrhea was red and was concerned it was blood. Has had dark stools, however has been taking pepto bismol. No emesis

## 2016-11-16 NOTE — Discharge Instructions (Signed)
Continue using Pepto Bismol or Kaopectate. Return if you are having any problems.

## 2016-11-16 NOTE — ED Provider Notes (Signed)
[facility]-EMERGENCY DEPT Provider Note   CSN: [id number] Arrival date & time: 11/16/16  1608     History   Chief Complaint Chief Complaint  Patient presents with  . Diarrhea  . Rectal Bleeding    HPI Victoria Joseph is a 83 y.o. female.  She has had diarrhea for the last 2 weeks. Diarrhea is watery and almost continuous. She saw her PCP yesterday who started her on metronidazole and a probiotic. No stool test was actually done. Today, she had several stools where she was passing reddish material and is concerned that it may be blood. She denies abdominal pain, nausea, vomiting, fever, chills. She has not had any dizziness or lightheadedness.   The history is provided by the patient.    Past Medical History:  Diagnosis Date  . Anxiety   . Arrhythmia    PVCs  . Diverticulosis   . GERD (gastroesophageal reflux disease)   . Heart murmur   . Hiatal hernia   . Hyperlipidemia   . Hypertension   . IBS (irritable bowel syndrome)   . Osteoarthritis   . Osteoporosis   . Pneumonia 2009  . PVD (peripheral vascular disease) (HCC)   . Rhinitis, allergic   . Sinusitis   . URI (upper respiratory infection)     Patient Active Problem List   Diagnosis Date Noted  . PVC (premature ventricular contraction) 01/14/2016  . PAD (peripheral artery disease) (HCC) 01/22/2011  . Allergic rhinitis due to pollen 11/27/2010  . PREMATURE VENTRICULAR CONTRACTIONS 04/19/2009  . HYPERLIPIDEMIA 11/06/2008  . Essential hypertension 11/06/2008  . URI 01/17/2008    Past Surgical History:  Procedure Laterality Date  . APPENDECTOMY    . CHOLECYSTECTOMY    . KNEE SURGERY     right repair after fall  . OOPHORECTOMY     still have 1 ovary    OB History    No data available       Home Medications    Prior to Admission medications   Medication Sig Start Date End Date Taking? Authorizing Provider  AMBULATORY NON FORMULARY MEDICATION Allergy injections once a week on Wednesdays    Historical  Provider, MD  amitriptyline (ELAVIL) 25 MG tablet Take 25 mg by mouth at bedtime.     Historical Provider, MD  aspirin 81 MG EC tablet Take 81 mg by mouth daily.      Historical Provider, MD  atenolol (TENORMIN) 50 MG tablet Take 1 tablet by mouth two  times daily 01/29/16   Corky CraftsJayadeep S Varanasi, MD  calcium citrate-vitamin D 200-200 MG-UNIT TABS Take 1 tablet by mouth 2 (two) times daily.     Historical Provider, MD  chlorthalidone (HYGROTON) 25 MG tablet Take 1 tablet by mouth  daily 01/29/16   Corky CraftsJayadeep S Varanasi, MD  Cholecalciferol (VITAMIN D3) 1000 units CAPS Take 1 capsule by mouth daily.     Historical Provider, MD  cilostazol (PLETAL) 100 MG tablet Take 100 mg by mouth 2 (two) times daily.     Historical Provider, MD  ESTRACE VAGINAL 0.1 MG/GM vaginal cream Place 1 Applicatorful vaginally 2 (two) times a week.  03/04/11   Historical Provider, MD  fluticasone (FLONASE) 50 MCG/ACT nasal spray Place 2 sprays into both nostrils at bedtime as needed for allergies or rhinitis.    Historical Provider, MD  loratadine (CLARITIN) 10 MG tablet Take 10 mg by mouth daily.    Historical Provider, MD  Misc. Devices (GELLHORN [place]) MISC Place 1 application vaginally every 3 (  three) months.  04/27/11   Historical Provider, MD  Multiple Vitamin (MULTIVITAMIN) tablet Take 1 tablet by mouth daily.      Historical Provider, MD  NONFORMULARY OR COMPOUNDED ITEM Allergy Vaccine 1:10 Given at [facility]    Historical Provider, MD  potassium chloride SA (K-DUR,KLOR-CON) 20 MEQ tablet Take 1 tablet by mouth  daily 01/29/16   Corky Crafts, MD  pravastatin (PRAVACHOL) 40 MG tablet Take 40 mg by mouth daily.      Historical Provider, MD  vitamin C (ASCORBIC ACID) 500 MG tablet Take 500 mg by mouth daily.     Historical Provider, MD  zoledronic acid (RECLAST) 5 MG/100ML SOLN Inject 5 mg into the vein. Once per year.     Historical Provider, MD    Family History Family History  Problem Relation Age of  Onset  . Heart failure Mother   . Asthma Paternal Grandfather   . Colon cancer Neg Hx   . Heart attack Neg Hx   . Stroke Neg Hx   . Hypertension Neg Hx     Social History Social History  Substance Use Topics  . Smoking status: Never Smoker  . Smokeless tobacco: Never Used     Comment: has exposure to 2nd hand smoke  . Alcohol use No     Allergies   Penicillins   Review of Systems Review of Systems  All other systems reviewed and are negative.    Physical Exam Updated Vital Signs BP 145/64 (BP Location: Left Arm)   Pulse 87   Temp 98.1 F (36.7 C) (Oral)   Resp 20   Ht 4\' 11"  (1.499 m)   Wt 110 lb (49.9 kg)   SpO2 100%   BMI 22.22 kg/m   Physical Exam  Nursing note and vitals reviewed.  83 year old female, resting comfortably and in no acute distress. Vital signs are Significant for hypertension. Oxygen saturation is 100%, which is normal. Head is normocephalic and atraumatic. PERRLA, EOMI. Oropharynx is clear. Neck is nontender and supple without adenopathy or JVD. Back is nontender and there is no CVA tenderness. Lungs are clear without rales, wheezes, or rhonchi. Chest is nontender. Heart has regular rate and rhythm without murmur. Abdomen is soft, flat, nontender without masses or hepatosplenomegaly and peristalsis is normoactive. Rectal: Normal sphincter tone. Small external hemorrhoids present. Stool is pinkish colored, but Hemoccult negative. Extremities have no cyanosis or edema, full range of motion is present. Skin is warm and dry without rash. Neurologic: Mental status is normal, cranial nerves are intact, there are no motor or sensory deficits.  ED Treatments / Results  Labs (all labs ordered are listed, but only abnormal results are displayed) Labs Reviewed  COMPREHENSIVE METABOLIC PANEL - Abnormal; Notable for the following:       Result Value   Glucose, Bld 110 (*)    All other components within normal limits  URINALYSIS, ROUTINE W  REFLEX MICROSCOPIC - Abnormal; Notable for the following:    Leukocytes, UA TRACE (*)    Squamous Epithelial / LPF 0-5 (*)    All other components within normal limits  C DIFFICILE QUICK SCREEN W PCR REFLEX  CBC  LIPASE, BLOOD  POC OCCULT BLOOD, ED  POC OCCULT BLOOD, ED  TYPE AND SCREEN  ABO/RH   Procedures Procedures (including critical care time)  Medications Ordered in ED Medications - No data to display   Initial Impression / Assessment and Plan / ED Course  I have reviewed the  triage vital signs and the nursing notes.  Pertinent lab results that were available during my care of the patient were reviewed by me and considered in my medical decision making (see chart for details).  Diarrhea with treatment recently initiated for Clostridium difficile. Red stool which is apparently not blood. Hemoglobin is normal. We'll check orthostatic vital signs. She will need to continue current treatment and to see if it is effective. If she is able to give a stool sample, will send for clostridioum difficile testing. Old records are reviewed, and she has no relevant past visits.   She has not been able to provide a stool sample while in the ED. She is discharged with instructions to continue her current treatment, and follow-up with PCP. Return precautions discussed.  Final Clinical Impressions(s) / ED Diagnoses   Final diagnoses:  Diarrhea, unspecified type    New Prescriptions New Prescriptions   No medications on file     Dione Booze, MD 11/16/16 2135

## 2016-11-17 ENCOUNTER — Other Ambulatory Visit: Payer: Self-pay | Admitting: Interventional Cardiology

## 2016-12-29 ENCOUNTER — Encounter: Payer: Self-pay | Admitting: Gastroenterology

## 2016-12-29 ENCOUNTER — Ambulatory Visit (INDEPENDENT_AMBULATORY_CARE_PROVIDER_SITE_OTHER): Payer: Medicare Other | Admitting: Gastroenterology

## 2016-12-29 ENCOUNTER — Other Ambulatory Visit: Payer: Medicare Other

## 2016-12-29 VITALS — BP 130/60 | HR 80 | Ht <= 58 in | Wt 109.2 lb

## 2016-12-29 DIAGNOSIS — R197 Diarrhea, unspecified: Secondary | ICD-10-CM

## 2016-12-29 DIAGNOSIS — K580 Irritable bowel syndrome with diarrhea: Secondary | ICD-10-CM

## 2016-12-29 NOTE — Progress Notes (Signed)
Victoria Joseph    [id number]    03/29/1932  Primary Care Physician:Richard Laymond Purser, MD  Referring Physician: Gaspar Garbe, MD [address] [place], [place] [address]  Chief complaint:  Diarrhea   HPI: 83 year old female here for evaluation with complaints of diarrhea for the past 3 months, she was previously followed by Dr. Juanda Chance, last seen in office April 2014. Patient started having watery loose stool since January, it was worse for first 2 weeks, somewhat improved after she was empirically treated with flagyl for C.diff She was given florostar probiotic, patient feels her diarrhea actually got worse with that. She was also advised to take Pepto-Bismol, presented to the ER in February with black stool as she was worried about GI bleed. Fecal Hemoccult negative and CBC /CMP were within normal limits She is continued to have intermittent diarrhea with loose  bowel movements, somewhat improved since she is eating bland diet, avoiding dairy and was also prescribed Librax by PMD and she has noticed decreased stool frequency  Outpatient Encounter Prescriptions as of 12/29/2016  Medication Sig  . amitriptyline (ELAVIL) 25 MG tablet Take 25 mg by mouth at bedtime.   Marland Kitchen aspirin EC 81 MG tablet Take 81 mg by mouth daily.  Marland Kitchen atenolol (TENORMIN) 50 MG tablet Take 1 tablet (50 mg total) by mouth 2 (two) times daily. *Please call and schedule a one year follow up appointment*  . Calcium Carbonate-Vitamin D (CALCIUM 600+D) 600-400 MG-UNIT tablet Take 1 tablet by mouth 2 (two) times daily.  . chlordiazePOXIDE (LIBRIUM) 25 MG capsule Take 25 mg by mouth 3 (three) times daily before meals.  . chlorthalidone (HYGROTON) 25 MG tablet Take 1 tablet by mouth  daily  . cholecalciferol (VITAMIN D) 1000 units tablet Take 1,000 Units by mouth daily.  . cilostazol (PLETAL) 100 MG tablet Take 100 mg by mouth 2 (two) times daily.   Marland Kitchen ESTRACE VAGINAL 0.1 MG/GM vaginal cream Place 1 Applicatorful  vaginally 2 (two) times a week.   . fluticasone (FLONASE) 50 MCG/ACT nasal spray Place 2 sprays into both nostrils daily as needed for rhinitis.   Marland Kitchen loratadine (CLARITIN) 10 MG tablet Take 10 mg by mouth daily.   . Multiple Vitamin (MULTIVITAMIN WITH MINERALS) TABS tablet Take 1 tablet by mouth daily.  . potassium chloride SA (K-DUR,KLOR-CON) 20 MEQ tablet Take 1 tablet by mouth  daily  . pravastatin (PRAVACHOL) 40 MG tablet Take 40 mg by mouth at bedtime.   . saccharomyces boulardii (FLORASTOR) 250 MG capsule Take 500 mg by mouth 2 (two) times daily.  . vitamin C (ASCORBIC ACID) 500 MG tablet Take 500 mg by mouth at bedtime.   . zoledronic acid (RECLAST) 5 MG/100ML SOLN Inject 5 mg into the vein once. Pt receives injection once per year.   No facility-administered encounter medications on file as of 12/29/2016.     Allergies as of 12/29/2016 - Review Complete 12/29/2016  Allergen Reaction Noted  . Penicillins Hives and Other (See Comments) 12/15/2011    Past Medical History:  Diagnosis Date  . Anxiety   . Arrhythmia    PVCs  . Diverticulosis   . GERD (gastroesophageal reflux disease)   . Heart murmur   . Hiatal hernia   . Hyperlipidemia   . Hypertension   . IBS (irritable bowel syndrome)   . Osteoarthritis   . Osteoporosis   . Pneumonia 2009  . PVD (peripheral vascular disease) (HCC)   .  Rhinitis, allergic   . Sinusitis   . URI (upper respiratory infection)     Past Surgical History:  Procedure Laterality Date  . APPENDECTOMY    . CHOLECYSTECTOMY    . KNEE SURGERY     right repair after fall  . OOPHORECTOMY     still have 1 ovary    Family History  Problem Relation Age of Onset  . Heart failure Mother   . Asthma Paternal Grandfather   . Colon cancer Neg Hx   . Heart attack Neg Hx   . Stroke Neg Hx   . Hypertension Neg Hx     Social History   Social History  . Marital status: Widowed    Spouse name: N/A  . Number of children: 2  . Years of education:  N/A   Occupational History  . retired Retired    working part time, Audiological scientist   Social History Main Topics  . Smoking status: Never Smoker  . Smokeless tobacco: Never Used     Comment: has exposure to 2nd hand smoke  . Alcohol use No  . Drug use: No  . Sexual activity: Not on file   Other Topics Concern  . Not on file   Social History Narrative  . No narrative on file      Review of systems: Review of Systems  Constitutional: Negative for fever and chills.  positive for loss of appetite HENT: Positive for sinus problems and difficulty hearing   Eyes: Negative for blurred vision.  Respiratory: Negative for cough, shortness of breath and wheezing.   Cardiovascular: Negative for chest pain and positive for irregular heartbeat and palpitations.  Gastrointestinal: as per HPI Genitourinary: Negative for dysuria, urgency, frequency and hematuria.  urinary incontinence has pessary Musculoskeletal: Positive for myalgias, back pain and joint pain.  Skin: Negative for itching and rash.  Neurological: Negative for dizziness, tremors, focal weakness, seizures and loss of consciousness.  Endo/Heme/Allergies: Positive for seasonal allergies.  Psychiatric/Behavioral: Negative for depression, suicidal ideas and hallucinations.  All other systems reviewed and are negative.   Physical Exam: Vitals:   12/29/16 1323  BP: 130/60  Pulse: 80   Body mass index is 23.85 kg/m. Gen:      No acute distress HEENT:  EOMI, sclera anicteric Neck:     No masses; no thyromegaly Lungs:    Clear to auscultation bilaterally; normal respiratory effort CV:         Regular rate and rhythm; no murmurs Abd:      + bowel sounds; soft, non-tender; no palpable masses, no distension Ext:    No edema; adequate peripheral perfusion Skin:      Warm and dry; no rash Neuro: alert and oriented x 3 Psych: normal mood and affect  Data Reviewed:  Reviewed labs, radiology imaging, old records and pertinent past  GI work up   Assessment and Plan/Recommendations:  83 year old female history of irritable bowel syndrome here for evaluation of diarrhea for past 3 months She was empirically treated for C. difficile but never had stool studies done Check GI pathogen panel to exclude infectious etiology Advise patient to avoid lactose, artificial sweeteners, high fructose drinks and soda Start probiotic VSL#3 112 Billion units once daily Okay to continue Librax as needed If continues to have persistent symptoms we'll consider colonoscopy or flexible sigmoidoscopy with biopsies to further investigate  Return in 1 month  K. Scherry Ran , MD [phone] Mon-Fri 8a-5p [phone] after 5p, weekends, holidays  CC: Tisovec, Adelfa Koh,  MD   

## 2016-12-29 NOTE — Patient Instructions (Addendum)
Go to the basement for your labs today  We have given you samples of VSL # 3 112 Units Once daily   Avoid Lactose, artificial sweeteners, High fructose drinks and sodas

## 2016-12-30 ENCOUNTER — Other Ambulatory Visit: Payer: Medicare Other

## 2016-12-30 ENCOUNTER — Other Ambulatory Visit: Payer: Self-pay | Admitting: Gastroenterology

## 2016-12-30 ENCOUNTER — Encounter: Payer: Self-pay | Admitting: Interventional Cardiology

## 2016-12-30 DIAGNOSIS — R197 Diarrhea, unspecified: Secondary | ICD-10-CM

## 2016-12-30 DIAGNOSIS — K580 Irritable bowel syndrome with diarrhea: Secondary | ICD-10-CM

## 2016-12-31 LAB — GASTROINTESTINAL PATHOGEN PANEL PCR
C. DIFFICILE TOX A/B, PCR: NOT DETECTED
CRYPTOSPORIDIUM, PCR: NOT DETECTED
Campylobacter, PCR: NOT DETECTED
E COLI 0157, PCR: NOT DETECTED
E coli (ETEC) LT/ST PCR: NOT DETECTED
E coli (STEC) stx1/stx2, PCR: NOT DETECTED
GIARDIA LAMBLIA, PCR: NOT DETECTED
Norovirus, PCR: NOT DETECTED
ROTAVIRUS, PCR: NOT DETECTED
Salmonella, PCR: NOT DETECTED
Shigella, PCR: NOT DETECTED

## 2017-01-05 ENCOUNTER — Telehealth: Payer: Self-pay | Admitting: Gastroenterology

## 2017-01-05 LAB — FECAL FAT, QUALITATIVE: FECAL FAT QUALITATIVE: NORMAL

## 2017-01-05 NOTE — Telephone Encounter (Signed)
Is it okay to take VSL#3 capsules twice a day?

## 2017-01-05 NOTE — Telephone Encounter (Signed)
Ok to take twice daily. Thanks

## 2017-01-05 NOTE — Telephone Encounter (Signed)
Discussed with the patient.

## 2017-01-18 ENCOUNTER — Encounter: Payer: Self-pay | Admitting: Interventional Cardiology

## 2017-01-18 ENCOUNTER — Ambulatory Visit (INDEPENDENT_AMBULATORY_CARE_PROVIDER_SITE_OTHER): Payer: Medicare Other | Admitting: Interventional Cardiology

## 2017-01-18 VITALS — BP 122/76 | HR 75 | Ht <= 58 in | Wt 109.0 lb

## 2017-01-18 DIAGNOSIS — I10 Essential (primary) hypertension: Secondary | ICD-10-CM

## 2017-01-18 DIAGNOSIS — I739 Peripheral vascular disease, unspecified: Secondary | ICD-10-CM | POA: Diagnosis not present

## 2017-01-18 DIAGNOSIS — I493 Ventricular premature depolarization: Secondary | ICD-10-CM

## 2017-01-18 MED ORDER — ATENOLOL 50 MG PO TABS: 50.0000 mg | ORAL_TABLET | Freq: Two times a day (BID) | ORAL | 3 refills | Status: DC

## 2017-01-18 NOTE — Patient Instructions (Signed)

## 2017-01-18 NOTE — Progress Notes (Signed)
Patient ID: Victoria Joseph, female   DOB: 01-10-1932, 83 y.o.   MRN: [id number]     Cardiology Office Note   Date:  01/18/2017   ID:  Victoria Joseph, DOB [birth year], MRN [id number]  PCP:  Gaspar Garbe, MD    Chief Complaint  Patient presents with  . Follow-up  f/u PVCs, PAD   Wt Readings from Last 3 Encounters:  01/18/17 109 lb (49.4 kg)  12/29/16 109 lb 4 oz (49.6 kg)  11/16/16 110 lb (49.9 kg)       History of Present Illness: Victoria Joseph is a 83 y.o. female  who returns today for evaluation of her PVCs, palpitations, mild mitral regurgitation, and PAD.  She has no palpitations. She denies any chest pain or angina. She is having no symptoms of presyncope or dizziness.    Her blood work is followed by primary care.   She has a history of PAD diagnosed by Dr. Madilyn Fireman back in early 2000. I have not seen the ultrasound report. She has minimal claudication.  She can walk a mile without pain.   Typically, she walks a mile for exercise.  Nodiscomfort walking up hill, only a mild incline. SHe does not have to stop walking. Other than hypertension and hyperlipidemia, she has no other conventional risk factors. She's had no ulcerations or sores on her feet. She has never smoked and is not diabetic.  No nonhealing ulcers.    Past Medical History:  Diagnosis Date  . Anxiety   . Arrhythmia    PVCs  . Diverticulosis   . GERD (gastroesophageal reflux disease)   . Heart murmur   . Hiatal hernia   . Hyperlipidemia   . Hypertension   . IBS (irritable bowel syndrome)   . Osteoarthritis   . Osteoporosis   . Pneumonia 2009  . PVD (peripheral vascular disease) (HCC)   . Rhinitis, allergic   . Sinusitis   . URI (upper respiratory infection)     Past Surgical History:  Procedure Laterality Date  . APPENDECTOMY    . CHOLECYSTECTOMY    . KNEE SURGERY     right repair after fall  . OOPHORECTOMY     still have 1 ovary     Current Outpatient Prescriptions  Medication  Sig Dispense Refill  . amitriptyline (ELAVIL) 25 MG tablet Take 25 mg by mouth at bedtime.     Marland Kitchen aspirin EC 81 MG tablet Take 81 mg by mouth daily.    Marland Kitchen atenolol (TENORMIN) 50 MG tablet Take 1 tablet (50 mg total) by mouth 2 (two) times daily. *Please call and schedule a one year follow up appointment* 180 tablet 0  . Calcium Carbonate-Vitamin D (CALCIUM 600+D) 600-400 MG-UNIT tablet Take 1 tablet by mouth 2 (two) times daily.    . chlordiazePOXIDE (LIBRIUM) 25 MG capsule Take 25 mg by mouth 3 (three) times daily before meals.    . chlorthalidone (HYGROTON) 25 MG tablet Take 1 tablet by mouth  daily 90 tablet 3  . cholecalciferol (VITAMIN D) 1000 units tablet Take 1,000 Units by mouth daily.    . cilostazol (PLETAL) 100 MG tablet Take 100 mg by mouth 2 (two) times daily.     Marland Kitchen ESTRACE VAGINAL 0.1 MG/GM vaginal cream Place 1 Applicatorful vaginally 2 (two) times a week.     . fexofenadine (ALLEGRA) 180 MG tablet Take 180 mg by mouth daily.    . fluticasone (FLONASE) 50 MCG/ACT nasal spray Place 2 sprays into  both nostrils daily as needed for rhinitis.     . Multiple Vitamin (MULTIVITAMIN WITH MINERALS) TABS tablet Take 1 tablet by mouth daily.    . potassium chloride SA (K-DUR,KLOR-CON) 20 MEQ tablet Take 1 tablet by mouth  daily 90 tablet 3  . pravastatin (PRAVACHOL) 40 MG tablet Take 40 mg by mouth at bedtime.     . vitamin C (ASCORBIC ACID) 500 MG tablet Take 500 mg by mouth at bedtime.     . zoledronic acid (RECLAST) 5 MG/100ML SOLN Inject 5 mg into the vein once. Pt receives injection once per year.     No current facility-administered medications for this visit.     Allergies:   Penicillins    Social History:  The patient  reports that she has never smoked. She has never used smokeless tobacco. She reports that she does not drink alcohol or use drugs.   Family History:  The patient's family history includes Asthma in her paternal grandfather; Heart failure in her mother.    ROS:   Please see the history of present illness.   Otherwise, review of systems are positive for IBS flare- more diarrhea.   All other systems are reviewed and negative.    PHYSICAL EXAM: VS:  BP 122/76   Pulse 75   Ht 4' 8.75" (1.441 m)   Wt 109 lb (49.4 kg)   SpO2 96%   BMI 23.80 kg/m  , BMI Body mass index is 23.8 kg/m. GEN: Well nourished, well developed, in no acute distress  HEENT: normal  Neck: no JVD, carotid bruits, or masses Cardiac: RRR; no murmurs, rubs, or gallops,no edema  Respiratory:  clear to auscultation bilaterally, normal work of breathing GI: soft, nontender, nondistended, + BS MS: no deformity or atrophy  Skin: warm and dry, no rash Neuro:  Strength and sensation are intact Psych: euthymic mood, full affect   EKG:   The ekg ordered today demonstrates NSR, no ST segment changes   Recent Labs: 11/16/2016: ALT 14; BUN 13; Creatinine, Ser 0.74; Hemoglobin 12.6; Platelets 396; Potassium 3.6; Sodium 140   Lipid Panel No results found for: CHOL, TRIG, HDL, CHOLHDL, VLDL, LDLCALC, LDLDIRECT     ASSESSMENT AND PLAN:  1. PAD: No sx of claudication on Pletal. COntinue regular walking, 150 min/week.  Moderately decreased right ABI in 2016. Low risk for limb loss.   2. PVCs: No sx at this time. Continue atenolol.  3. HTN: Controlled. Continue chlorthalidone and atenolol. Dr. Wylene Simmer follows blood work.  Potassium was borderline when she had diarrhea.   4.     Hyperlipidemia: LDL target < 100 due to PAD. Continue pravastatin. Followed by Dr. Wylene Simmer.   Current medicines are reviewed at length with the patient today.  The patient concerns regarding her medicines were addressed.  The following changes have been made:  No change  Labs/ tests ordered today include:  No orders of the defined types were placed in this encounter.   Recommend 150 minutes/week of aerobic exercise Low fat, low carb, high fiber diet recommended  Disposition:   FU in 1  year   Signed, Lance Muss, MD  01/18/2017 2:47 PM    [facility] [address] [place], [place], [place]  [address] Phone: [phone]; Fax: [phone]

## 2017-01-19 ENCOUNTER — Other Ambulatory Visit: Payer: Self-pay | Admitting: Interventional Cardiology

## 2017-02-03 ENCOUNTER — Ambulatory Visit (INDEPENDENT_AMBULATORY_CARE_PROVIDER_SITE_OTHER): Payer: Medicare Other | Admitting: Gastroenterology

## 2017-02-03 ENCOUNTER — Encounter: Payer: Self-pay | Admitting: Gastroenterology

## 2017-02-03 VITALS — BP 110/60 | HR 74 | Ht <= 58 in | Wt 109.4 lb

## 2017-02-03 DIAGNOSIS — R197 Diarrhea, unspecified: Secondary | ICD-10-CM | POA: Diagnosis not present

## 2017-02-03 DIAGNOSIS — K589 Irritable bowel syndrome without diarrhea: Secondary | ICD-10-CM

## 2017-02-03 MED ORDER — CHOLESTYRAMINE LIGHT 4 G PO PACK: 4.0000 g | PACK | Freq: Every day | ORAL | 2 refills | Status: DC

## 2017-02-03 NOTE — Patient Instructions (Addendum)
Stop VSL # 3   We will send your new prescription cholestyramine 4 gm to your pharmacy   Call in 2 weeks with an update on how you are doing    Lactose-Free Diet, Adult If you have lactose intolerance, you are not able to digest lactose. Lactose is a natural sugar found mainly in milk and milk products. You may need to avoid all foods and beverages that contain lactose. A lactose-free diet can help you do this. What do I need to know about this diet?  Do not consume foods, beverages, vitamins, minerals, or medicines with lactose. Read ingredients lists carefully.  Look for the words "lactose-free" on labels.  Use lactase enzyme drops or tablets as directed by your health care provider.  Use lactose-free milk or a milk alternative, such as soy milk, for drinking and cooking.  Make sure you get enough calcium and vitamin D in your diet. A lactose-free eating plan can be lacking in these important nutrients.  Take calcium and vitamin D supplements as directed by your health care provider. Talk to your provider about supplements if you are not able to get enough calcium and vitamin D from food. Which foods have lactose? Lactose is found in:  Milk and foods made from milk.  Yogurt.  Cheese.  Butter.  Margarine.  Sour cream.  Cream.  Whipped toppings and nondairy creamers.  Ice cream and other milk-based desserts. Lactose is also found in foods or products made with milk or milk ingredients. To find out whether a food contains milk or a milk ingredient, look at the ingredients list. Avoid foods with the statement "May contain milk" and foods that contain:  Butter.  Cream.  Milk.  Milk solids.  Milk powder.  Whey.  Curd.  Caseinate.  Lactose.  Lactalbumin.  Lactoglobulin. What are some alternatives to milk and foods made with milk products?  Lactose-free milk.  Soy milk with added calcium and vitamin D.  Almond, coconut, or rice milk with added calcium  and vitamin D. Note that these are low in protein.  Soy products, such as soy yogurt, soy cheese, soy ice cream, and soy-based sour cream. Which foods can I eat? Grains  Breads and rolls made without milk, such as [place], [place], or [place] bread, bagels, pita, and [employer]. Corn tortillas, corn meal, grits, and polenta. Crackers without lactose or milk solids, such as soda crackers and graham crackers. Cooked or dry cereals without lactose or milk solids. Pasta, quinoa, couscous, barley, oats, bulgur, farro, rice, wild rice, or other grains prepared without milk or lactose. Plain popcorn. Vegetables  Fresh, frozen, and canned vegetables without cheese, cream, or butter sauces. Fruits  All fresh, canned, frozen, or dried fruits that are not processed with lactose. Meats and Other Protein Sources  Plain beef, chicken, fish, [place], lamb, veal, pork, wild game, or ham. Kosher-prepared meat products. Strained or junior meats that do not contain milk. Eggs. Soy meat substitutes. Beans, lentils, and hummus. Tofu. Nuts and seeds. Peanut or other nut butters without lactose. Soups, casseroles, and mixed dishes without cheese, cream, or milk. Dairy  Lactose-free milk. Soy, rice, or almond milk with added calcium and vitamin D. Soy cheese and yogurt. Beverages  Carbonated drinks. Tea. Coffee, freeze-dried coffee, and some instant coffees. Fruit and vegetable juices. Condiments  Soy sauce. Carob powder. Olives. Gravy made with water. Baker's cocoa. Rosita Fire. Pure seasonings and spices. Ketchup. Mustard. Bouillon. Broth. Sweets and Desserts  Water and fruit ices. Gelatin. Cookies, pies, or  cakes made from allowed ingredients, such as angel food cake. Pudding made with water or a milk substitute. Lactose-free tofu desserts. Soy, coconut milk, or rice-milk-based frozen desserts. Sugar. Honey. Jam, jelly, and marmalade. Molasses. Pure sugar candy. Dark chocolate without milk. Marshmallows. Fats and Oils   Margarines and salad dressings that do not contain milk. Tomasa BlaseBacon. Vegetable oils. Shortening. Mayonnaise. Soy or coconut-based cream. The items listed above may not be a complete list of recommended foods or beverages. Contact your dietitian for more options.  Which foods are not recommended? Grains  Breads and rolls that contain milk. Toaster pastries. Muffins, biscuits, waffles, cornbread, and pancakes. These can be prepared at home, commercial, or from mixes. Sweet rolls, donuts, English muffins, fry bread, lefse, flour tortillas with lactose, or [place] toast made with milk or milk ingredients. Crackers that contain lactose. Corn curls. Cooked or dry cereals with lactose. Vegetables  Creamed or breaded vegetables. Vegetables in a cheese or butter sauce or with lactose-containing margarines. Instant potatoes. [place] fries. Scalloped or au gratin potatoes. Fruits  None. Meats and Other Protein Sources  Scrambled eggs, omelets, and souffles that contain milk. Creamed or breaded meat, fish, chicken, or [place]. Sausage products, such as wieners and liver sausage. Cold cuts that contain milk solids. Cheese, cottage cheese, ricotta cheese, and cheese spreads. Lasagna and macaroni and cheese. Pizza. Peanut or other nut butters with added milk solids. Casseroles or mixed dishes containing milk or cheese. Dairy  All dairy products, including milk, goat's milk, buttermilk, kefir, acidophilus milk, flavored milk, evaporated milk, condensed milk, dulce de [place], eggnog, yogurt, cheese, and cheese spreads. Beverages  Hot chocolate. Cocoa with lactose. Instant iced teas. Powdered fruit drinks. Smoothies made with milk or yogurt. Condiments  Chewing gum that has lactose. Cocoa that has lactose. Spice blends if they contain milk products. Artificial sweeteners that contain lactose. Nondairy creamers. Sweets and Desserts  Ice cream, ice milk, gelato, sherbet, and frozen yogurt. Custard, pudding, and mousse. Cake,  cream pies, cookies, and other desserts containing milk, cream, cream cheese, or milk chocolate. Pie crust made with milk-containing margarine or butter. Reduced-calorie desserts made with a sugar substitute that contains lactose. Toffee and butterscotch. Milk, white, or dark chocolate that contains milk. Fudge. Caramel. Fats and Oils  Margarines and salad dressings that contain milk or cheese. Cream. Half and half. Cream cheese. Sour cream. Chip dips made with sour cream or yogurt. The items listed above may not be a complete list of foods and beverages to avoid. Contact your dietitian for more information.  Am I getting enough calcium? Calcium is found in many foods that contain lactose and is important for bone health. The amount of calcium you need depends on your age:  Adults younger than 50 years: 1000 mg of calcium a day.  Adults older than 50 years: 1200 mg of calcium a day. If you are not getting enough calcium, other calcium sources include:  Orange juice with calcium added. There are 300-350 mg of calcium in 1 cup of orange juice.  Sardines with edible bones. There are 325 mg of calcium in 3 oz of sardines.  Calcium-fortified soy milk. There are 300-400 mg of calcium in 1 cup of calcium-fortified soy milk.  Calcium-fortified rice or almond milk. There are 300 mg of calcium in 1 cup of calcium-fortified rice or almond milk.  Canned salmon with edible bones. There are 180 mg of calcium in 3 oz of canned salmon with edible bones.  Calcium-fortified breakfast cereals. There are  [phone] mg of calcium in calcium-fortified breakfast cereals.  Tofu set with calcium sulfate. There are 250 mg of calcium in  cup of tofu set with calcium sulfate.  Spinach, cooked. There are 145 mg of calcium in  cup of cooked spinach.  Edamame, cooked. There are 130 mg of calcium in  cup of cooked edamame.  Collard greens, cooked. There are 125 mg of calcium in  cup of cooked collard  greens.  Kale, frozen or cooked. There are 90 mg of calcium in  cup of cooked or frozen kale.  Almonds. There are 95 mg of calcium in  cup of almonds.  Broccoli, cooked. There are 60 mg of calcium in 1 cup of cooked broccoli. This information is not intended to replace advice given to you by your health care provider. Make sure you discuss any questions you have with your health care provider. Document Released: 02/27/2002 Document Revised: 02/13/2016 Document Reviewed: 12/08/2013 Elsevier Interactive Patient Education  2017 [employer].

## 2017-02-03 NOTE — Progress Notes (Signed)
JEFF MCCALLUM    [id number]    February 27, 1932  Primary Care Physician:Tisovec, Adelfa Koh, MD  Referring Physician: Gaspar Garbe, MD [address] [place], [place] [address]  Chief complaint:  Diarrhea  HPI: 83 year old female with history of anxiety, irritable bowel syndrome here for follow-up visit. She continues to have intermittent episodes of increased bowel frequency and urgency. On average 3-4 bowel movements per day, mostly in the morning. Occasionally she has additional bowel movements depending on what she eats. She is not following a lactose-free diet. Librax is helping with her symptoms. She has worsening symptoms with fatty meals. Denies any vomiting, melena or blood per rectum. No significant improvement with probiotic. GI pathogen panel negative.    Outpatient Encounter Prescriptions as of 02/03/2017  Medication Sig  . amitriptyline (ELAVIL) 25 MG tablet Take 25 mg by mouth at bedtime.   Marland Kitchen aspirin EC 81 MG tablet Take 81 mg by mouth daily.  Marland Kitchen atenolol (TENORMIN) 50 MG tablet Take 1 tablet (50 mg total) by mouth 2 (two) times daily.  . Calcium Carbonate-Vitamin D (CALCIUM 600+D) 600-400 MG-UNIT tablet Take 1 tablet by mouth 2 (two) times daily.  . chlordiazePOXIDE (LIBRIUM) 25 MG capsule Take 25 mg by mouth 3 (three) times daily before meals.  . chlorthalidone (HYGROTON) 25 MG tablet TAKE 1 TABLET BY MOUTH  DAILY  . cholecalciferol (VITAMIN D) 1000 units tablet Take 1,000 Units by mouth daily.  . cilostazol (PLETAL) 100 MG tablet Take 100 mg by mouth 2 (two) times daily.   Marland Kitchen ESTRACE VAGINAL 0.1 MG/GM vaginal cream Place 1 Applicatorful vaginally 2 (two) times a week.   . fexofenadine (ALLEGRA) 180 MG tablet Take 180 mg by mouth daily.  . fluticasone (FLONASE) 50 MCG/ACT nasal spray Place 2 sprays into both nostrils daily as needed for rhinitis.   . Multiple Vitamin (MULTIVITAMIN WITH MINERALS) TABS tablet Take 1 tablet by mouth daily.  . potassium chloride  SA (K-DUR,KLOR-CON) 20 MEQ tablet TAKE 1 TABLET BY MOUTH  DAILY  . pravastatin (PRAVACHOL) 40 MG tablet Take 40 mg by mouth at bedtime.   . vitamin C (ASCORBIC ACID) 500 MG tablet Take 500 mg by mouth at bedtime.   . zoledronic acid (RECLAST) 5 MG/100ML SOLN Inject 5 mg into the vein once. Pt receives injection once per year.   No facility-administered encounter medications on file as of 02/03/2017.     Allergies as of 02/03/2017 - Review Complete 02/03/2017  Allergen Reaction Noted  . Penicillins Hives and Other (See Comments) 12/15/2011    Past Medical History:  Diagnosis Date  . Anxiety   . Arrhythmia    PVCs  . Diverticulosis   . GERD (gastroesophageal reflux disease)   . Heart murmur   . Hiatal hernia   . Hyperlipidemia   . Hypertension   . IBS (irritable bowel syndrome)   . Osteoarthritis   . Osteoporosis   . Pneumonia 2009  . PVD (peripheral vascular disease) (HCC)   . Rhinitis, allergic   . Sinusitis   . URI (upper respiratory infection)     Past Surgical History:  Procedure Laterality Date  . APPENDECTOMY    . CHOLECYSTECTOMY    . KNEE SURGERY     right repair after fall  . OOPHORECTOMY     still have 1 ovary    Family History  Problem Relation Age of Onset  . Heart failure Mother   . Asthma Paternal  Grandfather   . Colon cancer Neg Hx   . Heart attack Neg Hx   . Stroke Neg Hx   . Hypertension Neg Hx     Social History   Social History  . Marital status: Widowed    Spouse name: N/A  . Number of children: 2  . Years of education: N/A   Occupational History  . retired Retired    working part time, Audiological scientistaccounting   Social History Main Topics  . Smoking status: Never Smoker  . Smokeless tobacco: Never Used     Comment: has exposure to 2nd hand smoke  . Alcohol use No  . Drug use: No  . Sexual activity: Not on file   Other Topics Concern  . Not on file   Social History Narrative  . No narrative on file      Review of systems: Review  of Systems  Constitutional: Negative for fever and chills.  HENT: Negative.   Eyes: Negative for blurred vision.  Respiratory: Negative for cough, shortness of breath and wheezing.   Cardiovascular: Negative for chest pain and palpitations.  Gastrointestinal: as per HPI Genitourinary: Negative for dysuria, urgency, frequency and hematuria.  Musculoskeletal: Negative for myalgias, back pain and joint pain.  Skin: Negative for itching and rash.  Neurological: Negative for dizziness, tremors, focal weakness, seizures and loss of consciousness.  Endo/Heme/Allergies: Positive for seasonal allergies.  Psychiatric/Behavioral: Negative for depression, suicidal ideas and hallucinations.  All other systems reviewed and are negative.   Physical Exam: Vitals:   02/03/17 1336  BP: 110/60  Pulse: 74   Body mass index is 23.88 kg/m. Gen:      No acute distress HEENT:  EOMI, sclera anicteric Neck:     No masses; no thyromegaly Lungs:    Clear to auscultation bilaterally; normal respiratory effort CV:         Regular rate and rhythm; no murmurs Abd:      + bowel sounds; soft, non-tender; no palpable masses, no distension Ext:    No edema; adequate peripheral perfusion Skin:      Warm and dry; no rash Neuro: alert and oriented x 3 Psych: normal mood and affect  Data Reviewed:  Reviewed labs, radiology imaging, old records and pertinent past GI work up   Assessment and Plan/Recommendations:  83 year old female here with complaints of intermittent diarrhea likely irritable bowel syndrome predominant diarrhea  No significant improvement with the probiotic, advised patient to stop using it Lactose-free diet Start cholestyramine 4 g daily with dinner  Advised patient to call in 2 weeks to report any change of symptoms, if no significant improvement we'll consider colonoscopy with biopsies to exclude microscopic colitis    K. Scherry RanVeena Safire Gordin , MD [phone] Mon-Fri 8a-5p [phone] after 5p,  weekends, holidays  CC: Tisovec, Adelfa Kohichard W, MD

## 2017-02-17 ENCOUNTER — Telehealth: Payer: Self-pay | Admitting: Gastroenterology

## 2017-02-17 NOTE — Telephone Encounter (Signed)
Left message to call back  

## 2017-02-18 ENCOUNTER — Other Ambulatory Visit: Payer: Self-pay

## 2017-02-18 MED ORDER — CHOLESTYRAMINE LIGHT 4 G PO PACK: 4.0000 g | PACK | Freq: Two times a day (BID) | ORAL | 2 refills | Status: DC

## 2017-02-18 NOTE — Telephone Encounter (Signed)
Advised. Agrees to this plan of care.

## 2017-02-18 NOTE — Telephone Encounter (Signed)
Ok to increase Cholestyramine to twice daily. Thanks She will need to contact PMD or GYN for ?yeast infection

## 2017-02-18 NOTE — Telephone Encounter (Signed)
Left message to call back and to ask not to be put into the voicemail. Okay to send message through [employer].

## 2017-02-18 NOTE — Telephone Encounter (Signed)
Doing better with cholestyramine but still has some loose stools. She asks about increasing her dosing. Please advise. Her follow up is 03/10/17

## 2017-03-10 ENCOUNTER — Encounter (INDEPENDENT_AMBULATORY_CARE_PROVIDER_SITE_OTHER): Payer: Self-pay

## 2017-03-10 ENCOUNTER — Encounter: Payer: Self-pay | Admitting: Gastroenterology

## 2017-03-10 ENCOUNTER — Ambulatory Visit (INDEPENDENT_AMBULATORY_CARE_PROVIDER_SITE_OTHER): Payer: Medicare Other | Admitting: Gastroenterology

## 2017-03-10 VITALS — BP 118/70 | HR 70 | Ht <= 58 in | Wt 106.0 lb

## 2017-03-10 DIAGNOSIS — K580 Irritable bowel syndrome with diarrhea: Secondary | ICD-10-CM | POA: Diagnosis not present

## 2017-03-10 DIAGNOSIS — R159 Full incontinence of feces: Secondary | ICD-10-CM

## 2017-03-10 DIAGNOSIS — R152 Fecal urgency: Secondary | ICD-10-CM | POA: Diagnosis not present

## 2017-03-10 DIAGNOSIS — K9089 Other intestinal malabsorption: Secondary | ICD-10-CM

## 2017-03-10 NOTE — Patient Instructions (Signed)
Increase Prevalite to three times a day     Kegel Exercises Kegel exercises help strengthen the muscles that support the rectum, vagina, small intestine, bladder, and uterus. Doing Kegel exercises can help:  Improve bladder and bowel control.  Improve sexual response.  Reduce problems and discomfort during pregnancy.  Kegel exercises involve squeezing your pelvic floor muscles, which are the same muscles you squeeze when you try to stop the flow of urine. The exercises can be done while sitting, standing, or lying down, but it is best to vary your position. Phase 1 exercises 1. Squeeze your pelvic floor muscles tight. You should feel a tight lift in your rectal area. If you are a female, you should also feel a tightness in your vaginal area. Keep your stomach, buttocks, and legs relaxed. 2. Hold the muscles tight for up to 10 seconds. 3. Relax your muscles. Repeat this exercise 50 times a day or as many times as told by your health care provider. Continue to do this exercise for at least 4-6 weeks or for as long as told by your health care provider. This information is not intended to replace advice given to you by your health care provider. Make sure you discuss any questions you have with your health care provider. Document Released: 08/24/2012 Document Revised: 05/02/2016 Document Reviewed: 07/28/2015 Elsevier Interactive Patient Education  [employer]2018 Else[employer] Inc[employer]

## 2017-03-10 NOTE — Progress Notes (Signed)
Victoria Joseph    [id number]    1932-06-26  Primary Care Physician:Tisovec, Adelfa Kohichard W, MD  Referring Physician: Gaspar Garbeisovec, Richard W, MD [address] [place], [place] [id number]  Chief complaint: Diarrhea HPI: [age] yr F with history of anxiety, irritable bowel syndrome predominant diarrhea here for follow-up visit. Patient reported some improvement of diarrhea with decreased bowel frequency and improvement of urgency and stool consistency. She continues to have 2-3 bowel movements daily. She is following a lactose-free diet and is also taking cholestyramine twice daily. She stopped taking Librax.  Denies any nausea, vomiting, abdominal pain, melena or bright red blood per rectum  Last colonoscopy September 2012 with evidence of moderate sigmoid diverticulosis otherwise normal exam.   Outpatient Encounter Prescriptions as of 03/10/2017  Medication Sig  . amitriptyline (ELAVIL) 25 MG tablet Take 25 mg by mouth at bedtime.   Marland Kitchen. aspirin EC 81 MG tablet Take 81 mg by mouth daily.  Marland Kitchen. atenolol (TENORMIN) 50 MG tablet Take 1 tablet (50 mg total) by mouth 2 (two) times daily.  . Calcium Carbonate-Vitamin D (CALCIUM 600+D) 600-400 MG-UNIT tablet Take 1 tablet by mouth 2 (two) times daily.  . chlordiazePOXIDE (LIBRIUM) 25 MG capsule Take 25 mg by mouth 3 (three) times daily before meals.  . chlorthalidone (HYGROTON) 25 MG tablet TAKE 1 TABLET BY MOUTH  DAILY  . cholecalciferol (VITAMIN D) 1000 units tablet Take 1,000 Units by mouth daily.  . cholestyramine light (PREVALITE) 4 g packet Take 1 packet (4 g total) by mouth 2 (two) times daily. Mix with fluid or food.  . cilostazol (PLETAL) 100 MG tablet Take 100 mg by mouth 2 (two) times daily.   Marland Kitchen. ESTRACE VAGINAL 0.1 MG/GM vaginal cream Place 1 Applicatorful vaginally 2 (two) times a week.   . fexofenadine (ALLEGRA) 180 MG tablet Take 180 mg by mouth daily.  . fluticasone (FLONASE) 50 MCG/ACT nasal spray Place 2 sprays into both nostrils  daily as needed for rhinitis.   . Multiple Vitamin (MULTIVITAMIN WITH MINERALS) TABS tablet Take 1 tablet by mouth daily.  . potassium chloride SA (K-DUR,KLOR-CON) 20 MEQ tablet TAKE 1 TABLET BY MOUTH  DAILY  . pravastatin (PRAVACHOL) 40 MG tablet Take 40 mg by mouth at bedtime.   . vitamin C (ASCORBIC ACID) 500 MG tablet Take 500 mg by mouth at bedtime.   . zoledronic acid (RECLAST) 5 MG/100ML SOLN Inject 5 mg into the vein once. Pt receives injection once per year.   No facility-administered encounter medications on file as of 03/10/2017.     Allergies as of 03/10/2017 - Review Complete 03/10/2017  Allergen Reaction Noted  . Penicillins Hives and Other (See Comments) 12/15/2011    Past Medical History:  Diagnosis Date  . Anxiety   . Arrhythmia    PVCs  . Diverticulosis   . GERD (gastroesophageal reflux disease)   . Heart murmur   . Hiatal hernia   . Hyperlipidemia   . Hypertension   . IBS (irritable bowel syndrome)   . Osteoarthritis   . Osteoporosis   . Pneumonia 2009  . PVD (peripheral vascular disease) (HCC)   . Rhinitis, allergic   . Sinusitis   . URI (upper respiratory infection)     Past Surgical History:  Procedure Laterality Date  . APPENDECTOMY    . CHOLECYSTECTOMY    . KNEE SURGERY     right repair after fall  . OOPHORECTOMY     still  have 1 ovary    Family History  Problem Relation Age of Onset  . Heart failure Mother   . Asthma Paternal Grandfather   . Colon cancer Neg Hx   . Heart attack Neg Hx   . Stroke Neg Hx   . Hypertension Neg Hx     Social History   Social History  . Marital status: Widowed    Spouse name: N/A  . Number of children: 2  . Years of education: N/A   Occupational History  . retired Retired    working part time, Audiological scientist   Social History Main Topics  . Smoking status: Never Smoker  . Smokeless tobacco: Never Used     Comment: has exposure to 2nd hand smoke  . Alcohol use No  . Drug use: No  . Sexual activity:  Not on file   Other Topics Concern  . Not on file   Social History Narrative  . No narrative on file      Review of systems: Review of Systems  Constitutional: Negative for fever and chills.  HENT: Negative.   Eyes: Negative for blurred vision.  Respiratory: Negative for cough, shortness of breath and wheezing.   Cardiovascular: Negative for chest pain and palpitations.  Gastrointestinal: as per HPI Genitourinary: Negative for dysuria, urgency, frequency and hematuria.  Musculoskeletal: Negative for myalgias, back pain and joint pain.  Skin: Negative for itching and rash.  Neurological: Negative for dizziness, tremors, focal weakness, seizures and loss of consciousness.  Endo/Heme/Allergies: Positive for seasonal allergies.  Psychiatric/Behavioral: Negative for depression, suicidal ideas and hallucinations.  positive for anxiety All other systems reviewed and are negative.   Physical Exam: Vitals:   03/10/17 0828  BP: 118/70  Pulse: 70   Body mass index is 23.14 kg/m. Gen:      No acute distress HEENT:  EOMI, sclera anicteric Neck:     No masses; no thyromegaly Lungs:    Clear to auscultation bilaterally; normal respiratory effort CV:         Regular rate and rhythm; no murmurs Abd:      + bowel sounds; soft, non-tender; no palpable masses, no distension Ext:    No edema; adequate peripheral perfusion Skin:      Warm and dry; no rash Neuro: alert and oriented x 3 Psych: normal mood and affect  Data Reviewed:  Reviewed labs, radiology imaging, old records and pertinent past GI work up   Assessment and Plan/Recommendations:  83 year old female with history of anxiety disorder, irritable bowel syndrome predominant diarrhea here for follow-up visit  She does notice improvement of diarrhea with lactose-free diet and cholestyramine Increase cholestyramine to 3 times daily with meals Continue to avoid lactose and artificial sweeteners  Kegel exercises to improve  sphincter tone and decreased episodes of fecal urgency  We'll hold off colonoscopy for evaluation given improvement of symptoms  Return in 3 months for follow-up or sooner if needed   25 minutes was spent face-to-face with the patient. Greater than 50% of the time used for counseling as well as treatment plan and follow-up of diarrhea, IBS. She had multiple questions which were answered to her satisfaction  K. Scherry Ran , MD [phone] Mon-Fri 8a-5p [phone] after 5p, weekends, holidays  CC: Tisovec, Adelfa Koh, MD

## 2017-03-17 ENCOUNTER — Telehealth: Payer: Self-pay | Admitting: Gastroenterology

## 2017-03-17 MED ORDER — CHOLESTYRAMINE LIGHT 4 G PO PACK: 4.0000 g | PACK | Freq: Three times a day (TID) | ORAL | 3 refills | Status: DC

## 2017-03-17 NOTE — Telephone Encounter (Signed)
Medication sent today to pharmacy  Called pt to inform

## 2017-04-26 ENCOUNTER — Encounter (HOSPITAL_COMMUNITY): Payer: Self-pay

## 2017-04-26 ENCOUNTER — Ambulatory Visit (HOSPITAL_COMMUNITY)
Admission: RE | Admit: 2017-04-26 | Discharge: 2017-04-26 | Disposition: A | Discharge status: Home / Self Care | Payer: Medicare Other | Source: Ambulatory Visit | Attending: Internal Medicine | Admitting: Internal Medicine

## 2017-04-26 DIAGNOSIS — M810 Age-related osteoporosis without current pathological fracture: Secondary | ICD-10-CM | POA: Diagnosis present

## 2017-04-26 MED ORDER — ZOLEDRONIC ACID 5 MG/100ML IV SOLN
5.0000 mg | Freq: Once | INTRAVENOUS | Status: AC
  Administered 2017-04-26: 5 mg via INTRAVENOUS
  Filled 2017-04-26: qty 100

## 2017-04-26 MED ORDER — SODIUM CHLORIDE 0.9 % IV SOLN
Freq: Once | INTRAVENOUS | Status: AC
  Administered 2017-04-26: 11:00:00 via INTRAVENOUS

## 2017-04-26 NOTE — Progress Notes (Signed)
Uneventful Reclast infusion.  Pt given d/c instructions on Reclast.  Pt takes vitamin d and calcium daily.  Pt has had reclast in the past and did well.  Pt was d/c ambulatory to lobby.

## 2017-04-26 NOTE — Discharge Instructions (Signed)

## 2017-05-17 NOTE — Progress Notes (Signed)
Victoria Joseph    [id number]    [birth year]  Primary Care Physician:Tisovec, Adelfa Koh, MD  Referring Physician: Gaspar Garbe, MD [address] [place], [place] [address]  Chief complaint:  Diarrhea   HPI: 83 year old female here for follow-up visit, she was last seen June 2018. At last visit advised patient to increase Cholestyramine dose to 4 g 3 times daily and avoid lactose. She reports significant improvement of diarrhea and 2-3 bowel movements daily that are well formed. Denies any nausea, vomiting, abdominal pain, melena or bright red blood per rectum Patient has decreased appetite and is not eating as much as she did before. Weight is stable. Denies any dysphagia or odynophagia  Previous HPI: 62 yr F with history of anxiety, irritable bowel syndrome predominant diarrhea here for follow-up visit. Patient reported some improvement of diarrhea with decreased bowel frequency and improvement of urgency and stool consistency. She continues to have 2-3 bowel movements daily. She is following a lactose-free diet and is also taking cholestyramine twice daily. She stopped taking Librax.  Denies any nausea, vomiting, abdominal pain, melena or bright red blood per rectum  Last colonoscopy September 2012 with evidence of moderate sigmoid diverticulosis otherwise normal exam.    Outpatient Encounter Prescriptions as of 05/18/2017  Medication Sig  . amitriptyline (ELAVIL) 25 MG tablet Take 25 mg by mouth at bedtime.   Marland Kitchen aspirin EC 81 MG tablet Take 81 mg by mouth daily.  Marland Kitchen atenolol (TENORMIN) 50 MG tablet Take 1 tablet (50 mg total) by mouth 2 (two) times daily.  . Calcium Carbonate-Vitamin D (CALCIUM 600+D) 600-400 MG-UNIT tablet Take 1 tablet by mouth 2 (two) times daily.  . chlordiazePOXIDE (LIBRIUM) 25 MG capsule Take 25 mg by mouth 3 (three) times daily before meals.  . chlorthalidone (HYGROTON) 25 MG tablet TAKE 1 TABLET BY MOUTH  DAILY  . cholecalciferol (VITAMIN D)  1000 units tablet Take 1,000 Units by mouth daily.  . cholestyramine light (PREVALITE) 4 g packet Take 1 packet (4 g total) by mouth 3 (three) times daily. Mix with fluid or food.  . cilostazol (PLETAL) 100 MG tablet Take 100 mg by mouth 2 (two) times daily.   Marland Kitchen ESTRACE VAGINAL 0.1 MG/GM vaginal cream Place 1 Applicatorful vaginally 2 (two) times a week.   . fexofenadine (ALLEGRA) 180 MG tablet Take 180 mg by mouth daily.  . fluticasone (FLONASE) 50 MCG/ACT nasal spray Place 2 sprays into both nostrils daily as needed for rhinitis.   . Multiple Vitamin (MULTIVITAMIN WITH MINERALS) TABS tablet Take 1 tablet by mouth daily.  . potassium chloride SA (K-DUR,KLOR-CON) 20 MEQ tablet TAKE 1 TABLET BY MOUTH  DAILY  . pravastatin (PRAVACHOL) 40 MG tablet Take 40 mg by mouth at bedtime.   . vitamin C (ASCORBIC ACID) 500 MG tablet Take 500 mg by mouth at bedtime.   . zoledronic acid (RECLAST) 5 MG/100ML SOLN Inject 5 mg into the vein once. Pt receives injection once per year.  . cholestyramine (QUESTRAN) 4 g packet Take 1 packet (4 g total) by mouth 3 (three) times daily with meals.   No facility-administered encounter medications on file as of 05/18/2017.     Allergies as of 05/18/2017 - Review Complete 04/26/2017  Allergen Reaction Noted  . Penicillins Hives and Other (See Comments) 12/15/2011    Past Medical History:  Diagnosis Date  . Anxiety   . Arrhythmia    PVCs  . Diverticulosis   .  GERD (gastroesophageal reflux disease)   . Heart murmur   . Hiatal hernia   . Hyperlipidemia   . Hypertension   . IBS (irritable bowel syndrome)   . Osteoarthritis   . Osteoporosis   . Pneumonia 2009  . PVD (peripheral vascular disease) (HCC)   . Rhinitis, allergic   . Sinusitis   . URI (upper respiratory infection)     Past Surgical History:  Procedure Laterality Date  . APPENDECTOMY    . CHOLECYSTECTOMY    . KNEE SURGERY     right repair after fall  . OOPHORECTOMY     still have 1 ovary     Family History  Problem Relation Age of Onset  . Heart failure Mother   . Asthma Paternal Grandfather   . Colon cancer Neg Hx   . Heart attack Neg Hx   . Stroke Neg Hx   . Hypertension Neg Hx     Social History   Social History  . Marital status: Widowed    Spouse name: N/A  . Number of children: 2  . Years of education: N/A   Occupational History  . retired Retired    working part time, Audiological scientist   Social History Main Topics  . Smoking status: Never Smoker  . Smokeless tobacco: Never Used     Comment: has exposure to 2nd hand smoke  . Alcohol use No  . Drug use: No  . Sexual activity: Not on file   Other Topics Concern  . Not on file   Social History Narrative  . No narrative on file      Review of systems: Review of Systems  Constitutional: Negative for fever and chills.  HENT: Positive for sinus problems   Eyes: Negative for blurred vision.  Respiratory: Negative for cough, shortness of breath and wheezing.   Cardiovascular: Negative for chest pain and palpitations.  Gastrointestinal: as per HPI Genitourinary: Negative for dysuria, urgency, frequency and hematuria.  Musculoskeletal: Negative for myalgias, back pain and joint pain.  Skin: Negative for itching and rash.  Neurological: Negative for dizziness, tremors, focal weakness, seizures and loss of consciousness.  Endo/Heme/Allergies: Positive for seasonal allergies.  Psychiatric/Behavioral: Negative for depression, suicidal ideas and hallucinations.  All other systems reviewed and are negative.   Physical Exam: Vitals:   05/18/17 1437  BP: (!) 116/58  Pulse: 72   Body mass index is 22.66 kg/m. Gen:      No acute distress HEENT:  EOMI, sclera anicteric Neck:     No masses; no thyromegaly Lungs:    Clear to auscultation bilaterally; normal respiratory effort CV:         Regular rate and rhythm; no murmurs Abd:      + bowel sounds; soft, non-tender; no palpable masses, no  distension Ext:    No edema; adequate peripheral perfusion Skin:      Warm and dry; no rash Neuro: alert and oriented x 3 Psych: normal mood and affect  Data Reviewed:  Reviewed labs, radiology imaging, old records and pertinent past GI work up   Assessment and Plan/Recommendations:  83 year old female status post cholecystectomy with history of irritable bowel syndrome predominant diarrhea likely bile salt diarrhea here for follow-up visit Significant improvement with cholestyramine 4 g 3 times daily, continue Continue to avoid lactose Encouraged patient to eat small frequent meals Return in 1 year or sooner if needed  15 minutes was spent face-to-face with the patient. Greater than 50% of the time used for counseling  as well as treatment plan and follow-up of chronic diarrhea. She had multiple questions which were answered to her satisfaction  K. Scherry Ran , MD [phone] Mon-Fri 8a-5p [phone] after 5p, weekends, holidays  CC: Tisovec, Adelfa Koh, MD

## 2017-05-18 ENCOUNTER — Telehealth: Payer: Self-pay | Admitting: *Deleted

## 2017-05-18 ENCOUNTER — Encounter: Payer: Self-pay | Admitting: Gastroenterology

## 2017-05-18 ENCOUNTER — Ambulatory Visit (INDEPENDENT_AMBULATORY_CARE_PROVIDER_SITE_OTHER): Payer: Medicare Other | Admitting: Gastroenterology

## 2017-05-18 VITALS — BP 116/58 | HR 72 | Ht <= 58 in | Wt 103.8 lb

## 2017-05-18 DIAGNOSIS — K9089 Other intestinal malabsorption: Secondary | ICD-10-CM | POA: Diagnosis not present

## 2017-05-18 DIAGNOSIS — K580 Irritable bowel syndrome with diarrhea: Secondary | ICD-10-CM | POA: Diagnosis not present

## 2017-05-18 DIAGNOSIS — E739 Lactose intolerance, unspecified: Secondary | ICD-10-CM

## 2017-05-18 MED ORDER — CHOLESTYRAMINE 4 G PO PACK: 4.0000 g | PACK | Freq: Three times a day (TID) | ORAL | 12 refills | Status: DC

## 2017-05-18 NOTE — Patient Instructions (Signed)
We have resubmitted Cholestyramine to your pharmacy   Follow up in 1 year

## 2017-05-18 NOTE — Telephone Encounter (Signed)
We sent in new prescription of cholestyramine today to see if it would be cheaper for the patient  The Prevlite was $160 for a months supply so I sent in Cholestyramine regular for the patient and her cost is on $60 for the month... The only difference is the lite has Nutra sweet in it and the regular has regular sugar, Per Dr Lavon Paganini pt is ok to take the regular Cholestyramine,  informed patient

## 2017-10-04 ENCOUNTER — Other Ambulatory Visit: Payer: Self-pay | Admitting: Interventional Cardiology

## 2017-10-04 MED ORDER — CHLORTHALIDONE 25 MG PO TABS: 25.0000 mg | ORAL_TABLET | Freq: Every day | ORAL | 0 refills | Status: DC

## 2017-10-04 MED ORDER — ATENOLOL 50 MG PO TABS: 50.0000 mg | ORAL_TABLET | Freq: Two times a day (BID) | ORAL | 0 refills | Status: DC

## 2017-10-04 MED ORDER — POTASSIUM CHLORIDE CRYS ER 20 MEQ PO TBCR: 20.0000 meq | EXTENDED_RELEASE_TABLET | Freq: Every day | ORAL | 0 refills | Status: DC

## 2017-10-05 ENCOUNTER — Telehealth: Payer: Self-pay | Admitting: Gastroenterology

## 2017-10-05 MED ORDER — CHOLESTYRAMINE LIGHT 4 G PO PACK: 4.0000 g | PACK | Freq: Three times a day (TID) | ORAL | 3 refills | Status: DC

## 2017-10-05 NOTE — Telephone Encounter (Signed)
Med sent to [facility] and [facility] called pt and pharmacy

## 2017-10-05 NOTE — Telephone Encounter (Signed)
Patient states she is out of packet of medication of questran and would like a refill sent in to [facility]

## 2017-10-05 NOTE — Telephone Encounter (Signed)
Revonda Standardllison calling from [facility] states she is needing to let us know she changed it to the can of cholestryamine as it was cheaper for the patient.

## 2017-10-08 DIAGNOSIS — R69 Illness, unspecified: Secondary | ICD-10-CM | POA: Diagnosis not present

## 2017-10-13 DIAGNOSIS — N814 Uterovaginal prolapse, unspecified: Secondary | ICD-10-CM | POA: Diagnosis not present

## 2017-10-19 ENCOUNTER — Other Ambulatory Visit: Payer: Self-pay | Admitting: Interventional Cardiology

## 2017-10-22 ENCOUNTER — Telehealth: Payer: Self-pay | Admitting: Gastroenterology

## 2017-10-25 ENCOUNTER — Other Ambulatory Visit: Payer: Self-pay

## 2017-10-25 MED ORDER — COLESTIPOL HCL 1 G PO TABS: 1.0000 g | ORAL_TABLET | Freq: Two times a day (BID) | ORAL | 11 refills | Status: AC

## 2017-10-25 NOTE — Telephone Encounter (Signed)
Can we please check if Colestid 2gm twice daily will be covered? Or alternative Welchol. Thanks

## 2017-10-25 NOTE — Telephone Encounter (Signed)
A prescription for [facility]estid [facility] to [facility]. I have asked [facility] to run the prescription and let us know if it is over $70 per 30 days.

## 2017-10-25 NOTE — Telephone Encounter (Signed)
The cholestyramine has been moved to a tier 4.  What would be a an alternative for her to treat her diarrhea? She failed probiotics, VSL and minimal improvement with Librax. Her issue is the cost of the choleystramine. Thanks

## 2017-10-27 DIAGNOSIS — N390 Urinary tract infection, site not specified: Secondary | ICD-10-CM | POA: Diagnosis not present

## 2017-10-27 DIAGNOSIS — E876 Hypokalemia: Secondary | ICD-10-CM | POA: Diagnosis not present

## 2017-10-27 DIAGNOSIS — R5381 Other malaise: Secondary | ICD-10-CM | POA: Diagnosis not present

## 2017-10-27 DIAGNOSIS — R6889 Other general symptoms and signs: Secondary | ICD-10-CM | POA: Diagnosis not present

## 2017-10-27 DIAGNOSIS — K580 Irritable bowel syndrome with diarrhea: Secondary | ICD-10-CM | POA: Diagnosis not present

## 2017-10-27 DIAGNOSIS — D508 Other iron deficiency anemias: Secondary | ICD-10-CM | POA: Diagnosis not present

## 2017-10-27 DIAGNOSIS — R8290 Unspecified abnormal findings in urine: Secondary | ICD-10-CM | POA: Diagnosis not present

## 2017-10-27 DIAGNOSIS — Z6821 Body mass index (BMI) 21.0-21.9, adult: Secondary | ICD-10-CM | POA: Diagnosis not present

## 2017-10-27 DIAGNOSIS — I10 Essential (primary) hypertension: Secondary | ICD-10-CM | POA: Diagnosis not present

## 2017-10-27 NOTE — Telephone Encounter (Signed)
Left message that she may refill the cholestyramine if she prefers if it is not cost prohibitive.

## 2017-10-27 NOTE — Telephone Encounter (Signed)
Pt states she had a refill of old medication cholestyramine and wants to know if she can finish that before starting new medication colestipol.

## 2017-11-15 ENCOUNTER — Telehealth: Payer: Self-pay | Admitting: Gastroenterology

## 2017-11-15 NOTE — Telephone Encounter (Signed)
Patient is taking Colestid for control of diarrhea. She feels it is working well. She calls with complaints of burning red lips and tongue. She has lost her appetite. Certain foods burn her mouth. She has developed knee pain which she is not certain is related to her new medication. Do you have any suggestions?

## 2017-11-15 NOTE — Telephone Encounter (Signed)
Please advise her to take Colestid not at the same time as her other meds. Start taking B-complex 1 capsule daily. If continues to have persistent symptoms call PMD. Thanks

## 2017-11-16 DIAGNOSIS — H524 Presbyopia: Secondary | ICD-10-CM | POA: Diagnosis not present

## 2017-11-16 NOTE — Telephone Encounter (Signed)
Patient advised.

## 2017-11-29 DIAGNOSIS — M25551 Pain in right hip: Secondary | ICD-10-CM | POA: Diagnosis not present

## 2017-11-29 DIAGNOSIS — R2689 Other abnormalities of gait and mobility: Secondary | ICD-10-CM | POA: Diagnosis not present

## 2017-11-29 DIAGNOSIS — M6281 Muscle weakness (generalized): Secondary | ICD-10-CM | POA: Diagnosis not present

## 2017-11-29 DIAGNOSIS — R2681 Unsteadiness on feet: Secondary | ICD-10-CM | POA: Diagnosis not present

## 2017-12-02 DIAGNOSIS — R2689 Other abnormalities of gait and mobility: Secondary | ICD-10-CM | POA: Diagnosis not present

## 2017-12-02 DIAGNOSIS — M6281 Muscle weakness (generalized): Secondary | ICD-10-CM | POA: Diagnosis not present

## 2017-12-02 DIAGNOSIS — M25551 Pain in right hip: Secondary | ICD-10-CM | POA: Diagnosis not present

## 2017-12-02 DIAGNOSIS — R2681 Unsteadiness on feet: Secondary | ICD-10-CM | POA: Diagnosis not present

## 2017-12-07 DIAGNOSIS — H5211 Myopia, right eye: Secondary | ICD-10-CM | POA: Diagnosis not present

## 2017-12-07 DIAGNOSIS — R2681 Unsteadiness on feet: Secondary | ICD-10-CM | POA: Diagnosis not present

## 2017-12-07 DIAGNOSIS — Z961 Presence of intraocular lens: Secondary | ICD-10-CM | POA: Diagnosis not present

## 2017-12-07 DIAGNOSIS — H524 Presbyopia: Secondary | ICD-10-CM | POA: Diagnosis not present

## 2017-12-07 DIAGNOSIS — M25551 Pain in right hip: Secondary | ICD-10-CM | POA: Diagnosis not present

## 2017-12-07 DIAGNOSIS — H179 Unspecified corneal scar and opacity: Secondary | ICD-10-CM | POA: Diagnosis not present

## 2017-12-07 DIAGNOSIS — H52223 Regular astigmatism, bilateral: Secondary | ICD-10-CM | POA: Diagnosis not present

## 2017-12-07 DIAGNOSIS — H353131 Nonexudative age-related macular degeneration, bilateral, early dry stage: Secondary | ICD-10-CM | POA: Diagnosis not present

## 2017-12-07 DIAGNOSIS — R2689 Other abnormalities of gait and mobility: Secondary | ICD-10-CM | POA: Diagnosis not present

## 2017-12-07 DIAGNOSIS — M6281 Muscle weakness (generalized): Secondary | ICD-10-CM | POA: Diagnosis not present

## 2017-12-09 DIAGNOSIS — R2681 Unsteadiness on feet: Secondary | ICD-10-CM | POA: Diagnosis not present

## 2017-12-09 DIAGNOSIS — M25551 Pain in right hip: Secondary | ICD-10-CM | POA: Diagnosis not present

## 2017-12-09 DIAGNOSIS — M6281 Muscle weakness (generalized): Secondary | ICD-10-CM | POA: Diagnosis not present

## 2017-12-09 DIAGNOSIS — R2689 Other abnormalities of gait and mobility: Secondary | ICD-10-CM | POA: Diagnosis not present

## 2017-12-14 DIAGNOSIS — M6281 Muscle weakness (generalized): Secondary | ICD-10-CM | POA: Diagnosis not present

## 2017-12-14 DIAGNOSIS — M25551 Pain in right hip: Secondary | ICD-10-CM | POA: Diagnosis not present

## 2017-12-14 DIAGNOSIS — R2689 Other abnormalities of gait and mobility: Secondary | ICD-10-CM | POA: Diagnosis not present

## 2017-12-14 DIAGNOSIS — R2681 Unsteadiness on feet: Secondary | ICD-10-CM | POA: Diagnosis not present

## 2017-12-16 DIAGNOSIS — R2681 Unsteadiness on feet: Secondary | ICD-10-CM | POA: Diagnosis not present

## 2017-12-16 DIAGNOSIS — M25551 Pain in right hip: Secondary | ICD-10-CM | POA: Diagnosis not present

## 2017-12-16 DIAGNOSIS — M6281 Muscle weakness (generalized): Secondary | ICD-10-CM | POA: Diagnosis not present

## 2017-12-16 DIAGNOSIS — R2689 Other abnormalities of gait and mobility: Secondary | ICD-10-CM | POA: Diagnosis not present

## 2017-12-20 ENCOUNTER — Ambulatory Visit (INDEPENDENT_AMBULATORY_CARE_PROVIDER_SITE_OTHER): Payer: Medicare HMO | Admitting: Gastroenterology

## 2017-12-20 ENCOUNTER — Encounter: Payer: Self-pay | Admitting: Gastroenterology

## 2017-12-20 VITALS — BP 128/50 | HR 83 | Temp 98.7°F | Ht <= 58 in | Wt 100.2 lb

## 2017-12-20 DIAGNOSIS — R630 Anorexia: Secondary | ICD-10-CM

## 2017-12-20 DIAGNOSIS — K580 Irritable bowel syndrome with diarrhea: Secondary | ICD-10-CM | POA: Diagnosis not present

## 2017-12-20 DIAGNOSIS — K9089 Other intestinal malabsorption: Secondary | ICD-10-CM

## 2017-12-20 NOTE — Patient Instructions (Signed)
If you are age 82 or older, your body mass index should be between 23-30. Your Body mass index is 21.87 kg/m. If this is out of the aforementioned range listed, please consider follow up with your Primary Care Provider.  If you are age 82 or younger, your body mass index should be between 19-25. Your Body mass index is 21.87 kg/m. If this is out of the aformentioned range listed, please consider follow up with your Primary Care Provider.   Please continue your Colestid as prescribed. Please follow up with your primary care physician.  Thank you for choosing [facility]  Dr Lavon PaganiniNandigam

## 2017-12-20 NOTE — Progress Notes (Signed)
Victoria Joseph    [id number]    [birth year]  Primary Care Physician:Tisovec, Adelfa Koh, MD  Referring Physician: Gaspar Garbe, MD [address] [place], [place] [address]  Chief complaint:  Anorexia, fatigue  HPI: 84 year old very pleasant female with history of irritable bowel syndrome predominant diarrhea is here for follow-up visit .diarrhea has improved significantly since she started taking Colestid .  She is currently having 2 bowel movements in the morning .  Denies any nocturnal symptoms .  No abdominal pain, nausea, vomiting or blood per rectum.  She was recently treated for UTI and since then she has lost her appetite .  She is currently drinking boost every day . she also reports having low-grade fevers along with night sweats .  She called her primary care doctor's office and was told that her urinary infection has cleared.  No upper respiratory symptoms or sinus congestion.  No new joint pains, headache or skin rash.     Outpatient Encounter Medications as of 12/20/2017  Medication Sig  . amitriptyline (ELAVIL) 25 MG tablet Take 25 mg by mouth at bedtime.   Marland Kitchen aspirin EC 81 MG tablet Take 81 mg by mouth daily.  Marland Kitchen atenolol (TENORMIN) 50 MG tablet Take 1 tablet (50 mg total) by mouth 2 (two) times daily. Please keep upcoming appt in April for future refills. Thank you.  . Calcium Carbonate-Vitamin D (CALCIUM 600+D) 600-400 MG-UNIT tablet Take 1 tablet by mouth 2 (two) times daily.  . chlorthalidone (HYGROTON) 25 MG tablet Take 1 tablet (25 mg total) by mouth daily. Please make yearly appt with Dr. Eldridge Dace for April. Thank you  . cholecalciferol (VITAMIN D) 1000 units tablet Take 1,000 Units by mouth daily.  . cilostazol (PLETAL) 100 MG tablet Take 100 mg by mouth 2 (two) times daily.   . colestipol (COLESTID) 1 g tablet Take 1 tablet (1 g total) by mouth 2 (two) times daily.  Marland Kitchen ESTRACE VAGINAL 0.1 MG/GM vaginal cream Place 1 Applicatorful vaginally 2 (two) times  a week.   . fexofenadine (ALLEGRA) 180 MG tablet Take 180 mg by mouth daily.  . fluticasone (FLONASE) 50 MCG/ACT nasal spray Place 2 sprays into both nostrils daily as needed for rhinitis.   . Multiple Vitamin (MULTIVITAMIN WITH MINERALS) TABS tablet Take 1 tablet by mouth daily.  . potassium chloride SA (K-DUR,KLOR-CON) 20 MEQ tablet Take 1 tablet (20 mEq total) by mouth daily. Please make yearly appt for April with Dr. Eldridge Dace. Thank you  . pravastatin (PRAVACHOL) 40 MG tablet Take 40 mg by mouth at bedtime.   . vitamin C (ASCORBIC ACID) 500 MG tablet Take 500 mg by mouth at bedtime.   . zoledronic acid (RECLAST) 5 MG/100ML SOLN Inject 5 mg into the vein once. Pt receives injection once per year.  . [DISCONTINUED] chlordiazePOXIDE (LIBRIUM) 25 MG capsule Take 25 mg by mouth 3 (three) times daily before meals.  . [DISCONTINUED] famotidine (PEPCID) 20 MG tablet Take 20 mg by mouth daily.    . [DISCONTINUED] Potassium Chloride (KLOR-CON 10 PO) Take 10 mEq by mouth daily.     No facility-administered encounter medications on file as of 12/20/2017.     Allergies as of 12/20/2017 - Review Complete 05/18/2017  Allergen Reaction Noted  . Penicillins Hives and Other (See Comments) 12/15/2011    Past Medical History:  Diagnosis Date  . Anxiety   . Arrhythmia    PVCs  . Diverticulosis   .  GERD (gastroesophageal reflux disease)   . Heart murmur   . Hiatal hernia   . Hyperlipidemia   . Hypertension   . IBS (irritable bowel syndrome)   . Osteoarthritis   . Osteoporosis   . Pneumonia 2009  . PVD (peripheral vascular disease) (HCC)   . Rhinitis, allergic   . Sinusitis   . URI (upper respiratory infection)     Past Surgical History:  Procedure Laterality Date  . APPENDECTOMY    . CHOLECYSTECTOMY    . KNEE SURGERY     right repair after fall  . OOPHORECTOMY     still have 1 ovary    Family History  Problem Relation Age of Onset  . Heart failure Mother   . Asthma Paternal  Grandfather   . Colon cancer Neg Hx   . Heart attack Neg Hx   . Stroke Neg Hx   . Hypertension Neg Hx     Social History   Socioeconomic History  . Marital status: Widowed    Spouse name: Not on file  . Number of children: 2  . Years of education: Not on file  . Highest education level: Not on file  Occupational History  . Occupation: retired    Associate Professormployer: RETIRED    Comment: working part time, Hotel manageraccounting  Social Needs  . Financial resource strain: Not on file  . Food insecurity:    Worry: Not on file    Inability: Not on file  . Transportation needs:    Medical: Not on file    Non-medical: Not on file  Tobacco Use  . Smoking status: Never Smoker  . Smokeless tobacco: Never Used  . Tobacco comment: has exposure to 2nd hand smoke  Substance and Sexual Activity  . Alcohol use: No  . Drug use: No  . Sexual activity: Not on file  Lifestyle  . Physical activity:    Days per week: Not on file    Minutes per session: Not on file  . Stress: Not on file  Relationships  . Social connections:    Talks on phone: Not on file    Gets together: Not on file    Attends religious service: Not on file    Active member of club or organization: Not on file    Attends meetings of clubs or organizations: Not on file    Relationship status: Not on file  . Intimate partner violence:    Fear of current or ex partner: Not on file    Emotionally abused: Not on file    Physically abused: Not on file    Forced sexual activity: Not on file  Other Topics Concern  . Not on file  Social History Narrative  . Not on file      Review of systems: Review of Systems  Constitutional: Negative for fever and chills. Positive for lack of energy and loss of appetite.  Night sweats and low-grade fever HENT: Positive for postnasal drip   Eyes: Negative for blurred vision.  Respiratory: Negative for cough, shortness of breath and wheezing.   Cardiovascular: Negative for chest pain and palpitations.   Gastrointestinal: as per HPI Genitourinary: Negative for dysuria, urgency, frequency and hematuria.  Musculoskeletal: Positive for myalgias, back pain and joint pain.  Skin: Negative for itching and rash.  Neurological: Negative for dizziness, tremors, focal weakness, seizures and loss of consciousness.  Endo/Heme/Allergies: Positive for seasonal allergies.  Psychiatric/Behavioral: Negative for depression, suicidal ideas and hallucinations.  All other systems reviewed and  are negative.   Physical Exam: Vitals:   12/20/17 1448  BP: (!) 128/50  Pulse: 83  Temp: 98.7 F (37.1 C)   Body mass index is 21.87 kg/m. Gen:      No acute distress HEENT:  EOMI, sclera anicteric Neck:     No masses; no thyromegaly Lungs:    Clear to auscultation bilaterally; normal respiratory effort CV:         Regular rate and rhythm; no murmurs Abd:      + bowel sounds; soft, non-tender; no palpable masses, no distension Ext:    No edema; adequate peripheral perfusion Skin:      Warm and dry; no rash Neuro: alert and oriented x 3 Psych: normal mood and affect  Data Reviewed:  Reviewed labs, radiology imaging, old records and pertinent past GI work up  Colonoscopy September 2012 with evidence of moderate sigmoid diverticulosis otherwise normal exam.   Assessment and Plan/Recommendations:  84 year old female with history of irritable bowel syndrome predominant diarrhea. 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  Currently is doing well from GI standpoint and has no new GI complaints. Advised patient to continue small frequent meals High protein diet Complains of fatigue, night sweats and low-grade fever but she has no localizing symptoms and her physical exam was unremarkable, reassured patient.   She has follow-up appointment for annual physical with Dr Wylene Simmer in few weeks and is also due for annual labs.  Advised her to follow-up with  PMD Return in 1 year or sooner if needed  25 minutes was spent face-to-face with the patient. Greater than 50% of the time used for counseling as well as treatment plan and follow-up. She had multiple questions which were answered to her satisfaction  K. Scherry Ran , MD [phone] Mon-Fri 8a-5p [phone] after 5p, weekends, holidays  CC: Tisovec, Adelfa Koh, MD

## 2017-12-21 ENCOUNTER — Encounter: Payer: Self-pay | Admitting: Gastroenterology

## 2017-12-21 DIAGNOSIS — R2689 Other abnormalities of gait and mobility: Secondary | ICD-10-CM | POA: Diagnosis not present

## 2017-12-21 DIAGNOSIS — R2681 Unsteadiness on feet: Secondary | ICD-10-CM | POA: Diagnosis not present

## 2017-12-21 DIAGNOSIS — M25551 Pain in right hip: Secondary | ICD-10-CM | POA: Diagnosis not present

## 2017-12-21 DIAGNOSIS — M6281 Muscle weakness (generalized): Secondary | ICD-10-CM | POA: Diagnosis not present

## 2017-12-23 DIAGNOSIS — R2681 Unsteadiness on feet: Secondary | ICD-10-CM | POA: Diagnosis not present

## 2017-12-23 DIAGNOSIS — R2689 Other abnormalities of gait and mobility: Secondary | ICD-10-CM | POA: Diagnosis not present

## 2017-12-23 DIAGNOSIS — M25551 Pain in right hip: Secondary | ICD-10-CM | POA: Diagnosis not present

## 2017-12-23 DIAGNOSIS — M6281 Muscle weakness (generalized): Secondary | ICD-10-CM | POA: Diagnosis not present

## 2018-01-07 ENCOUNTER — Encounter: Payer: Self-pay | Admitting: Interventional Cardiology

## 2018-01-10 DIAGNOSIS — E559 Vitamin D deficiency, unspecified: Secondary | ICD-10-CM | POA: Diagnosis not present

## 2018-01-10 DIAGNOSIS — E7800 Pure hypercholesterolemia, unspecified: Secondary | ICD-10-CM | POA: Diagnosis not present

## 2018-01-10 DIAGNOSIS — I10 Essential (primary) hypertension: Secondary | ICD-10-CM | POA: Diagnosis not present

## 2018-01-10 DIAGNOSIS — R7302 Impaired glucose tolerance (oral): Secondary | ICD-10-CM | POA: Diagnosis not present

## 2018-01-14 DIAGNOSIS — K580 Irritable bowel syndrome with diarrhea: Secondary | ICD-10-CM | POA: Diagnosis not present

## 2018-01-14 DIAGNOSIS — R82998 Other abnormal findings in urine: Secondary | ICD-10-CM | POA: Diagnosis not present

## 2018-01-14 DIAGNOSIS — Z0000 Encounter for general adult medical examination without abnormal findings: Secondary | ICD-10-CM | POA: Diagnosis not present

## 2018-01-14 DIAGNOSIS — D508 Other iron deficiency anemias: Secondary | ICD-10-CM | POA: Diagnosis not present

## 2018-01-14 DIAGNOSIS — M1990 Unspecified osteoarthritis, unspecified site: Secondary | ICD-10-CM | POA: Diagnosis not present

## 2018-01-14 DIAGNOSIS — M810 Age-related osteoporosis without current pathological fracture: Secondary | ICD-10-CM | POA: Diagnosis not present

## 2018-01-14 DIAGNOSIS — R7302 Impaired glucose tolerance (oral): Secondary | ICD-10-CM | POA: Diagnosis not present

## 2018-01-14 DIAGNOSIS — I10 Essential (primary) hypertension: Secondary | ICD-10-CM | POA: Diagnosis not present

## 2018-01-14 DIAGNOSIS — I7389 Other specified peripheral vascular diseases: Secondary | ICD-10-CM | POA: Diagnosis not present

## 2018-01-14 DIAGNOSIS — K219 Gastro-esophageal reflux disease without esophagitis: Secondary | ICD-10-CM | POA: Diagnosis not present

## 2018-01-14 DIAGNOSIS — E7800 Pure hypercholesterolemia, unspecified: Secondary | ICD-10-CM | POA: Diagnosis not present

## 2018-01-17 NOTE — Progress Notes (Signed)
Cardiology Office Note   Date:  01/18/2018   ID:  Victoria Joseph, DOB 08-08-1932, MRN [id number]  PCP:  Victoria Garbe, MD    No chief complaint on file.  PAD  Wt Readings from Last 3 Encounters:  01/18/18 101 lb 3.2 oz (45.9 kg)  12/20/17 100 lb 3.2 oz (45.5 kg)  05/18/17 103 lb 12.8 oz (47.1 kg)       History of Present Illness: Victoria Joseph is a 84 y.o. female  who returns today for evaluation of her PVCs, palpitations, mild mitral regurgitation, and PAD.   She has a history of PAD diagnosed by Victoria Joseph back in early 2000. I have not seen the ultrasound report. She has minimal claudication.    She lives at [employer] and exercises regularly with a stationary bike, and other classes.    Denies : Chest pain. Dizziness. Leg edema. Nitroglycerin use. Orthopnea. Palpitations. Paroxysmal nocturnal dyspnea. Shortness of breath. Syncope.   No nonhealing sores.  SHe continues with her regular activities and is not limited by leg pains.     Past Medical History:  Diagnosis Date  . Anxiety   . Arrhythmia    PVCs  . Diverticulosis   . GERD (gastroesophageal reflux disease)   . Heart murmur   . Hiatal hernia   . Hyperlipidemia   . Hypertension   . IBS (irritable bowel syndrome)   . Osteoarthritis   . Osteoporosis   . Pneumonia 2009  . PVD (peripheral vascular disease) (HCC)   . Rhinitis, allergic   . Sinusitis   . URI (upper respiratory infection)     Past Surgical History:  Procedure Laterality Date  . APPENDECTOMY    . CHOLECYSTECTOMY    . KNEE SURGERY     right repair after fall  . OOPHORECTOMY     still have 1 ovary     Current Outpatient Medications  Medication Sig Dispense Refill  . amitriptyline (ELAVIL) 25 MG tablet Take 25 mg by mouth at bedtime.     Marland Kitchen aspirin EC 81 MG tablet Take 81 mg by mouth daily.    Marland Kitchen atenolol (TENORMIN) 50 MG tablet Take 1 tablet (50 mg total) by mouth 2 (two) times daily. Please keep upcoming appt in April for  future refills. Thank you. 180 tablet 0  . Calcium Carbonate-Vitamin D (CALCIUM 600+D) 600-400 MG-UNIT tablet Take 1 tablet by mouth 2 (two) times daily.    . chlorthalidone (HYGROTON) 25 MG tablet Take 1 tablet (25 mg total) by mouth daily. Please make yearly appt with Dr. Eldridge Joseph for April. Thank you 90 tablet 0  . cholecalciferol (VITAMIN D) 1000 units tablet Take 1,000 Units by mouth daily.    . cilostazol (PLETAL) 100 MG tablet Take 100 mg by mouth 2 (two) times daily.     . colestipol (COLESTID) 1 g tablet Take 1 tablet (1 g total) by mouth 2 (two) times daily. 60 tablet 11  . ESTRACE VAGINAL 0.1 MG/GM vaginal cream Place 1 Applicatorful vaginally 2 (two) times a week.     . ferrous sulfate 325 (65 FE) MG tablet Take 325 mg by mouth 2 (two) times daily.  5  . fexofenadine (ALLEGRA) 180 MG tablet Take 180 mg by mouth daily.    . fluticasone (FLONASE) 50 MCG/ACT nasal spray Place 2 sprays into both nostrils daily as needed for rhinitis.     . Multiple Vitamin (MULTIVITAMIN WITH MINERALS) TABS tablet Take 1 tablet by mouth daily.    Marland Kitchen  potassium chloride SA (K-DUR,KLOR-CON) 20 MEQ tablet Take 1 tablet (20 mEq total) by mouth daily. Please make yearly appt for April with Dr. Eldridge Joseph. Thank you 90 tablet 0  . pravastatin (PRAVACHOL) 40 MG tablet Take 40 mg by mouth at bedtime.     . vitamin C (ASCORBIC ACID) 500 MG tablet Take 500 mg by mouth at bedtime.     . zoledronic acid (RECLAST) 5 MG/100ML SOLN Inject 5 mg into the vein once. Pt receives injection once per year.     No current facility-administered medications for this visit.     Allergies:   Penicillins and Sulfa antibiotics    Social History:  The patient  reports that she has never smoked. She has never used smokeless tobacco. She reports that she does not drink alcohol or use drugs.   Family History:  The patient's family history includes Asthma in her paternal grandfather; Heart failure in her mother.    ROS:  Please see the  history of present illness.   Otherwise, review of systems are positive for hip pain.   All other systems are reviewed and negative.    PHYSICAL EXAM: VS:  BP 120/64   Pulse 85   Ht 4' 8.75" (1.441 m)   Wt 101 lb 3.2 oz (45.9 kg)   SpO2 99%   BMI 22.09 kg/m  , BMI Body mass index is 22.09 kg/m. GEN: Well nourished, well developed, in no acute distress  HEENT: normal  Neck: no JVD, carotid bruits, or masses Cardiac: RRR; no murmurs, rubs, or gallops,no edema ; diminished pedal pulses bilaterally Respiratory:  clear to auscultation bilaterally, normal work of breathing GI: soft, nontender, nondistended, + BS MS: no deformity or atrophy  Skin: warm and dry, no rash Neuro:  Strength and sensation are intact Psych: euthymic mood, full affect   EKG:   The ekg ordered today demonstrates NSR, nonspecific ST changes   Recent Labs: No results found for requested labs within last 8760 hours.   Lipid Panel No results found for: CHOL, TRIG, HDL, CHOLHDL, VLDL, LDLCALC, LDLDIRECT   Other studies Reviewed: Additional studies/ records that were reviewed today with results demonstrating: labs reviewed.   ASSESSMENT AND PLAN:  1. PAD: Minimal claudication.  Not limited.  No evidence of tissue loss or limb threatening ishemia.  2. PVCs: Not symptomatic.  3. HTN: Controlled.  COntinue current meds. 4. Hyperlipidemia: LDL 127  On pravastatin.  WIll switch to atorvastatin 20 mg daily.  She prefers to have labs checked with PMD.  SHe will need LFTs and lipids checked in about three months.  LDL target < 100.    Current medicines are reviewed at length with the patient today.  The patient concerns regarding her medicines were addressed.  The following changes have been made:  No change  Labs/ tests ordered today include:  No orders of the defined types were placed in this encounter.   Recommend 150 minutes/week of aerobic exercise Low fat, low carb, high fiber diet  recommended  Disposition:   FU in 1 year   Signed, Victoria Muss, MD  01/18/2018 1:49 PM    [facility] [address] [place], [place], [place]  [address] Phone: [phone]; Fax: [phone]

## 2018-01-18 ENCOUNTER — Ambulatory Visit: Payer: Medicare Other | Admitting: Interventional Cardiology

## 2018-01-18 ENCOUNTER — Encounter: Payer: Self-pay | Admitting: Interventional Cardiology

## 2018-01-18 VITALS — BP 120/64 | HR 85 | Ht <= 58 in | Wt 101.2 lb

## 2018-01-18 DIAGNOSIS — I493 Ventricular premature depolarization: Secondary | ICD-10-CM | POA: Diagnosis not present

## 2018-01-18 DIAGNOSIS — E782 Mixed hyperlipidemia: Secondary | ICD-10-CM

## 2018-01-18 DIAGNOSIS — I10 Essential (primary) hypertension: Secondary | ICD-10-CM | POA: Diagnosis not present

## 2018-01-18 DIAGNOSIS — I739 Peripheral vascular disease, unspecified: Secondary | ICD-10-CM

## 2018-01-18 MED ORDER — ATENOLOL 50 MG PO TABS: 50.0000 mg | ORAL_TABLET | Freq: Two times a day (BID) | ORAL | 3 refills | Status: AC

## 2018-01-18 MED ORDER — CHLORTHALIDONE 25 MG PO TABS: 25.0000 mg | ORAL_TABLET | Freq: Every day | ORAL | 3 refills | Status: DC

## 2018-01-18 MED ORDER — ATORVASTATIN CALCIUM 20 MG PO TABS: 20.0000 mg | ORAL_TABLET | Freq: Every day | ORAL | 3 refills | Status: AC

## 2018-01-18 MED ORDER — POTASSIUM CHLORIDE CRYS ER 20 MEQ PO TBCR: 20.0000 meq | EXTENDED_RELEASE_TABLET | Freq: Every day | ORAL | 3 refills | Status: DC

## 2018-01-18 NOTE — Patient Instructions (Addendum)
Medication Instructions:  Your physician has recommended you make the following change in your medication:   1. STOP: pravastatin  2. START: atorvastatin (lipitor) 20 mg tablet: Take 1 tablet once daily  Labwork: Your physician recommends that you have a FASTING lipid profile and liver function panel drawn at your primary care doctor's office   Testing/Procedures: None ordered  Follow-Up: Your physician wants you to follow-up in: 1 year with Dr. Eldridge Dace. You will receive a reminder letter in the mail two months in advance. If you don't receive a letter, please call our office to schedule the follow-up appointment.   Any Other Special Instructions Will Be Listed Below (If Applicable).     If you need a refill on your cardiac medications before your next appointment, please call your pharmacy.

## 2018-01-21 DIAGNOSIS — Z1212 Encounter for screening for malignant neoplasm of rectum: Secondary | ICD-10-CM | POA: Diagnosis not present

## 2018-01-25 DIAGNOSIS — M810 Age-related osteoporosis without current pathological fracture: Secondary | ICD-10-CM | POA: Diagnosis not present

## 2018-01-28 ENCOUNTER — Ambulatory Visit: Payer: Self-pay | Admitting: Podiatry

## 2018-02-03 ENCOUNTER — Ambulatory Visit: Payer: Medicare HMO | Admitting: Podiatry

## 2018-02-03 ENCOUNTER — Encounter: Payer: Self-pay | Admitting: Podiatry

## 2018-02-03 VITALS — Ht 59.0 in | Wt 95.0 lb

## 2018-02-03 DIAGNOSIS — M2042 Other hammer toe(s) (acquired), left foot: Secondary | ICD-10-CM

## 2018-02-03 DIAGNOSIS — G8929 Other chronic pain: Secondary | ICD-10-CM

## 2018-02-03 DIAGNOSIS — M79672 Pain in left foot: Secondary | ICD-10-CM | POA: Diagnosis not present

## 2018-02-03 DIAGNOSIS — Q828 Other specified congenital malformations of skin: Secondary | ICD-10-CM

## 2018-03-03 ENCOUNTER — Telehealth: Payer: Self-pay | Admitting: Interventional Cardiology

## 2018-03-03 NOTE — Telephone Encounter (Signed)
Pt calling   Pt c/o medication issue:  1. Name of Medication: Lipitor  2. How are you currently taking this medication (dosage and times per day)? 20mg /1 tablet at night  3. Are you having a reaction (difficulty breathing--STAT)? no  4. What is your medication issue? BP dropping and joint pain

## 2018-03-03 NOTE — Telephone Encounter (Signed)
OK to cut chlorthalidone to 12.5 mg daily. WOuld not stop atorvastatin,.

## 2018-03-03 NOTE — Telephone Encounter (Signed)
Left message for patient to call back  

## 2018-03-03 NOTE — Telephone Encounter (Signed)
Returned call patient who states that her atorvastatin is causing her BP to be low and joint pain. Made patient aware that atorvastatin does not cause hypotension. Patient states that her BP has been running around 90/62 for the past several weeks. No HR values. Patient states that her SBP is normally 130s. Patient takes atenolol 50 mg BID, chlorthalidone 25 mg QD, and pletal 100 mg BID. Patient denies having any chest pain, SOB, lightheadedness, dizziness, syncope, palpitations, irregular heartbeats, or any other symptoms. Patient states that she has been staying hydrated. Patient states that her hands are always cold. Patient states that she decided to stop her atorvastatin 20 mg QD about a week ago due to her joint pain. Patient was taking pravastatin 40 mg QD before we switched her to atorvastatin at her last OV on 4/30. Made patient aware that I would forward the information to Dr. Eldridge DaceVaranasi for review. Instructed the patient to let us know if she becomes symptomatic with her BP being low. Patient verbalized understanding and thanked me for the call.

## 2018-03-07 MED ORDER — CHLORTHALIDONE 25 MG PO TABS: 12.5000 mg | ORAL_TABLET | Freq: Every day | ORAL | 3 refills | Status: AC

## 2018-03-07 NOTE — Telephone Encounter (Signed)
Called and instructed patient to decrease chlorthalidone to 12.5 mg QD and to restart her atorvastatin. Patient verbalized understanding and thanked me for the call.

## 2018-03-15 DIAGNOSIS — M25561 Pain in right knee: Secondary | ICD-10-CM | POA: Diagnosis not present

## 2018-03-15 DIAGNOSIS — M1711 Unilateral primary osteoarthritis, right knee: Secondary | ICD-10-CM | POA: Diagnosis not present

## 2018-04-10 NOTE — Progress Notes (Signed)
Subjective:  Patient ID: Victoria Joseph, female    DOB: 01-Aug-1932,  MRN: [id number]  Chief Complaint  Patient presents with  . Callouses    callus vs plantar wart center of left heel - very painful at times   84 y.o. female presents with the above complaint.  Reports callus versus wart to the center of the left heel very painful. Past Medical History:  Diagnosis Date  . Anxiety   . Arrhythmia    PVCs  . Diverticulosis   . GERD (gastroesophageal reflux disease)   . Heart murmur   . Hiatal hernia   . Hyperlipidemia   . Hypertension   . IBS (irritable bowel syndrome)   . Osteoarthritis   . Osteoporosis   . Pneumonia 2009  . PVD (peripheral vascular disease) (HCC)   . Rhinitis, allergic   . Sinusitis   . URI (upper respiratory infection)    Past Surgical History:  Procedure Laterality Date  . APPENDECTOMY    . CHOLECYSTECTOMY    . KNEE SURGERY     right repair after fall  . OOPHORECTOMY     still have 1 ovary    Current Outpatient Medications:  .  amitriptyline (ELAVIL) 25 MG tablet, Take 25 mg by mouth at bedtime. , Disp: , Rfl:  .  aspirin EC 81 MG tablet, Take 81 mg by mouth daily., Disp: , Rfl:  .  atenolol (TENORMIN) 50 MG tablet, Take 1 tablet (50 mg total) by mouth 2 (two) times daily., Disp: 180 tablet, Rfl: 3 .  atorvastatin (LIPITOR) 20 MG tablet, Take 1 tablet (20 mg total) by mouth daily., Disp: 90 tablet, Rfl: 3 .  Calcium Carbonate-Vitamin D (CALCIUM 600+D) 600-400 MG-UNIT tablet, Take 1 tablet by mouth 2 (two) times daily., Disp: , Rfl:  .  chlorthalidone (HYGROTON) 25 MG tablet, Take 0.5 tablets (12.5 mg total) by mouth daily., Disp: 45 tablet, Rfl: 3 .  cholecalciferol (VITAMIN D) 1000 units tablet, Take 1,000 Units by mouth daily., Disp: , Rfl:  .  cilostazol (PLETAL) 100 MG tablet, Take 100 mg by mouth 2 (two) times daily. , Disp: , Rfl:  .  colestipol (COLESTID) 1 g tablet, Take 1 tablet (1 g total) by mouth 2 (two) times daily., Disp: 60 tablet, Rfl:  11 .  ESTRACE VAGINAL 0.1 MG/GM vaginal cream, Place 1 Applicatorful vaginally 2 (two) times a week. , Disp: , Rfl:  .  ferrous sulfate 325 (65 FE) MG tablet, Take 325 mg by mouth 2 (two) times daily., Disp: , Rfl: 5 .  fexofenadine (ALLEGRA) 180 MG tablet, Take 180 mg by mouth daily., Disp: , Rfl:  .  fluticasone (FLONASE) 50 MCG/ACT nasal spray, Place 2 sprays into both nostrils daily as needed for rhinitis. , Disp: , Rfl:  .  Multiple Vitamin (MULTIVITAMIN WITH MINERALS) TABS tablet, Take 1 tablet by mouth daily., Disp: , Rfl:  .  potassium chloride SA (K-DUR,KLOR-CON) 20 MEQ tablet, Take 1 tablet (20 mEq total) by mouth daily., Disp: 90 tablet, Rfl: 3 .  vitamin C (ASCORBIC ACID) 500 MG tablet, Take 500 mg by mouth at bedtime. , Disp: , Rfl:  .  zoledronic acid (RECLAST) 5 MG/100ML SOLN, Inject 5 mg into the vein once. Pt receives injection once per year., Disp: , Rfl:   Allergies  Allergen Reactions  . Penicillins Hives, Other (See Comments) and Rash    Has patient had a PCN reaction causing immediate rash, facial/tongue/throat swelling, SOB or lightheadedness with hypotension: No  Has patient had a PCN reaction causing severe rash involving mucus membranes or skin necrosis: No Has patient had a PCN reaction that required hospitalization No Has patient had a PCN reaction occurring within the last 10 years: No If all of the above answers are "NO", then may proceed with Cephalosporin use.  . Sulfa Antibiotics     nausea   Review of Systems: Negative except as noted in the HPI. Denies N/V/F/Ch. Objective:  There were no vitals filed for this visit. General AA&O x3. Normal mood and affect.  Vascular Dorsalis pedis and posterior tibial pulses  present 2+ bilaterally  Capillary refill normal to all digits. Pedal hair growth normal.  Neurologic Epicritic sensation grossly present.  Dermatologic No open lesions. Interspaces clear of maceration. Nails well groomed and normal in  appearance. H PK left fifth toe left heel  Orthopedic: MMT 5/5 in dorsiflexion, plantarflexion, inversion, and eversion. Normal joint ROM without pain or crepitus.   Assessment & Plan:  Patient was evaluated and treated and all questions answered.  Callus L 5th toe and Heel -Calluses gently debrided courtesy for patient.  Advised on self-care.  Discussed urea cream.  Return if symptoms worsen or fail to improve.

## 2018-04-13 DIAGNOSIS — N819 Female genital prolapse, unspecified: Secondary | ICD-10-CM | POA: Diagnosis not present

## 2018-04-13 DIAGNOSIS — N952 Postmenopausal atrophic vaginitis: Secondary | ICD-10-CM | POA: Diagnosis not present

## 2018-04-13 DIAGNOSIS — M810 Age-related osteoporosis without current pathological fracture: Secondary | ICD-10-CM | POA: Diagnosis not present

## 2018-04-13 DIAGNOSIS — Z6821 Body mass index (BMI) 21.0-21.9, adult: Secondary | ICD-10-CM | POA: Diagnosis not present

## 2018-04-13 DIAGNOSIS — Z79899 Other long term (current) drug therapy: Secondary | ICD-10-CM | POA: Diagnosis not present

## 2018-04-18 DIAGNOSIS — J301 Allergic rhinitis due to pollen: Secondary | ICD-10-CM | POA: Diagnosis not present

## 2018-04-18 DIAGNOSIS — J3081 Allergic rhinitis due to animal (cat) (dog) hair and dander: Secondary | ICD-10-CM | POA: Diagnosis not present

## 2018-04-18 DIAGNOSIS — J3089 Other allergic rhinitis: Secondary | ICD-10-CM | POA: Diagnosis not present

## 2018-04-28 ENCOUNTER — Encounter (HOSPITAL_COMMUNITY): Payer: Self-pay

## 2018-04-28 ENCOUNTER — Ambulatory Visit (HOSPITAL_COMMUNITY)
Admission: RE | Admit: 2018-04-28 | Discharge: 2018-04-28 | Disposition: A | Discharge status: Home / Self Care | Payer: Medicare HMO | Source: Ambulatory Visit | Attending: Internal Medicine | Admitting: Internal Medicine

## 2018-04-28 DIAGNOSIS — M810 Age-related osteoporosis without current pathological fracture: Secondary | ICD-10-CM | POA: Insufficient documentation

## 2018-04-28 MED ORDER — ZOLEDRONIC ACID 5 MG/100ML IV SOLN: 5.0000 mg | Freq: Once | INTRAVENOUS | Status: DC

## 2018-04-28 MED ORDER — SODIUM CHLORIDE 0.9 % IV SOLN
Freq: Once | INTRAVENOUS | Status: AC
  Administered 2018-04-28: 11:00:00 via INTRAVENOUS

## 2018-04-28 MED ORDER — ZOLEDRONIC ACID 5 MG/100ML IV SOLN
INTRAVENOUS | Status: AC
  Administered 2018-04-28: 5 mg
  Filled 2018-04-28: qty 100

## 2018-04-28 NOTE — Discharge Instructions (Signed)

## 2018-04-28 NOTE — Progress Notes (Signed)
reclast given today.  Uneventful infusion.  Pt has had several times before.  Med list updated, reclast info.given to pt.  Pt was d/c ambulatory to lobby.

## 2018-05-27 DIAGNOSIS — L570 Actinic keratosis: Secondary | ICD-10-CM | POA: Diagnosis not present

## 2018-05-27 DIAGNOSIS — L814 Other melanin hyperpigmentation: Secondary | ICD-10-CM | POA: Diagnosis not present

## 2018-05-27 DIAGNOSIS — L821 Other seborrheic keratosis: Secondary | ICD-10-CM | POA: Diagnosis not present

## 2018-07-13 DIAGNOSIS — E559 Vitamin D deficiency, unspecified: Secondary | ICD-10-CM | POA: Diagnosis not present

## 2018-07-13 DIAGNOSIS — K580 Irritable bowel syndrome with diarrhea: Secondary | ICD-10-CM | POA: Diagnosis not present

## 2018-07-13 DIAGNOSIS — E7800 Pure hypercholesterolemia, unspecified: Secondary | ICD-10-CM | POA: Diagnosis not present

## 2018-07-13 DIAGNOSIS — R7302 Impaired glucose tolerance (oral): Secondary | ICD-10-CM | POA: Diagnosis not present

## 2018-07-13 DIAGNOSIS — M810 Age-related osteoporosis without current pathological fracture: Secondary | ICD-10-CM | POA: Diagnosis not present

## 2018-07-13 DIAGNOSIS — R69 Illness, unspecified: Secondary | ICD-10-CM | POA: Diagnosis not present

## 2018-07-13 DIAGNOSIS — I10 Essential (primary) hypertension: Secondary | ICD-10-CM | POA: Diagnosis not present

## 2018-07-13 DIAGNOSIS — D508 Other iron deficiency anemias: Secondary | ICD-10-CM | POA: Diagnosis not present

## 2018-07-13 DIAGNOSIS — D692 Other nonthrombocytopenic purpura: Secondary | ICD-10-CM | POA: Diagnosis not present

## 2018-08-01 ENCOUNTER — Inpatient Hospital Stay (HOSPITAL_COMMUNITY)
Admission: EM | Admit: 2018-08-01 | Discharge: 2018-08-21 | DRG: 061 | Disposition: E | Payer: Medicare HMO | Attending: Neurology | Admitting: Neurology

## 2018-08-01 ENCOUNTER — Inpatient Hospital Stay (HOSPITAL_COMMUNITY): Payer: Medicare HMO

## 2018-08-01 ENCOUNTER — Emergency Department (HOSPITAL_COMMUNITY): Payer: Medicare HMO

## 2018-08-01 ENCOUNTER — Encounter (HOSPITAL_COMMUNITY): Payer: Self-pay | Admitting: Radiology

## 2018-08-01 DIAGNOSIS — D696 Thrombocytopenia, unspecified: Secondary | ICD-10-CM | POA: Diagnosis not present

## 2018-08-01 DIAGNOSIS — Z66 Do not resuscitate: Secondary | ICD-10-CM | POA: Diagnosis not present

## 2018-08-01 DIAGNOSIS — R4182 Altered mental status, unspecified: Secondary | ICD-10-CM | POA: Diagnosis not present

## 2018-08-01 DIAGNOSIS — I6522 Occlusion and stenosis of left carotid artery: Secondary | ICD-10-CM | POA: Diagnosis present

## 2018-08-01 DIAGNOSIS — I5023 Acute on chronic systolic (congestive) heart failure: Secondary | ICD-10-CM | POA: Diagnosis not present

## 2018-08-01 DIAGNOSIS — Z882 Allergy status to sulfonamides status: Secondary | ICD-10-CM

## 2018-08-01 DIAGNOSIS — R509 Fever, unspecified: Secondary | ICD-10-CM | POA: Diagnosis not present

## 2018-08-01 DIAGNOSIS — I491 Atrial premature depolarization: Secondary | ICD-10-CM | POA: Diagnosis not present

## 2018-08-01 DIAGNOSIS — I9589 Other hypotension: Secondary | ICD-10-CM | POA: Diagnosis not present

## 2018-08-01 DIAGNOSIS — R0600 Dyspnea, unspecified: Secondary | ICD-10-CM

## 2018-08-01 DIAGNOSIS — Z8249 Family history of ischemic heart disease and other diseases of the circulatory system: Secondary | ICD-10-CM

## 2018-08-01 DIAGNOSIS — I480 Paroxysmal atrial fibrillation: Secondary | ICD-10-CM | POA: Diagnosis present

## 2018-08-01 DIAGNOSIS — R69 Illness, unspecified: Secondary | ICD-10-CM | POA: Diagnosis not present

## 2018-08-01 DIAGNOSIS — Z515 Encounter for palliative care: Secondary | ICD-10-CM | POA: Diagnosis not present

## 2018-08-01 DIAGNOSIS — I10 Essential (primary) hypertension: Secondary | ICD-10-CM | POA: Diagnosis not present

## 2018-08-01 DIAGNOSIS — A419 Sepsis, unspecified organism: Secondary | ICD-10-CM | POA: Diagnosis not present

## 2018-08-01 DIAGNOSIS — R5084 Febrile nonhemolytic transfusion reaction: Secondary | ICD-10-CM | POA: Diagnosis not present

## 2018-08-01 DIAGNOSIS — R29706 NIHSS score 6: Secondary | ICD-10-CM | POA: Diagnosis present

## 2018-08-01 DIAGNOSIS — J9809 Other diseases of bronchus, not elsewhere classified: Secondary | ICD-10-CM | POA: Diagnosis not present

## 2018-08-01 DIAGNOSIS — Z79890 Hormone replacement therapy (postmenopausal): Secondary | ICD-10-CM

## 2018-08-01 DIAGNOSIS — I6523 Occlusion and stenosis of bilateral carotid arteries: Secondary | ICD-10-CM | POA: Diagnosis not present

## 2018-08-01 DIAGNOSIS — I4891 Unspecified atrial fibrillation: Secondary | ICD-10-CM | POA: Diagnosis not present

## 2018-08-01 DIAGNOSIS — R4701 Aphasia: Secondary | ICD-10-CM | POA: Diagnosis present

## 2018-08-01 DIAGNOSIS — K580 Irritable bowel syndrome with diarrhea: Secondary | ICD-10-CM | POA: Diagnosis present

## 2018-08-01 DIAGNOSIS — J9690 Respiratory failure, unspecified, unspecified whether with hypoxia or hypercapnia: Secondary | ICD-10-CM | POA: Diagnosis not present

## 2018-08-01 DIAGNOSIS — I742 Embolism and thrombosis of arteries of the upper extremities: Secondary | ICD-10-CM | POA: Diagnosis not present

## 2018-08-01 DIAGNOSIS — D72829 Elevated white blood cell count, unspecified: Secondary | ICD-10-CM | POA: Diagnosis not present

## 2018-08-01 DIAGNOSIS — T69012D Immersion hand, left hand, subsequent encounter: Secondary | ICD-10-CM | POA: Diagnosis not present

## 2018-08-01 DIAGNOSIS — I110 Hypertensive heart disease with heart failure: Secondary | ICD-10-CM | POA: Diagnosis present

## 2018-08-01 DIAGNOSIS — J301 Allergic rhinitis due to pollen: Secondary | ICD-10-CM | POA: Diagnosis present

## 2018-08-01 DIAGNOSIS — E876 Hypokalemia: Secondary | ICD-10-CM | POA: Diagnosis not present

## 2018-08-01 DIAGNOSIS — I639 Cerebral infarction, unspecified: Secondary | ICD-10-CM | POA: Diagnosis not present

## 2018-08-01 DIAGNOSIS — F419 Anxiety disorder, unspecified: Secondary | ICD-10-CM | POA: Diagnosis present

## 2018-08-01 DIAGNOSIS — J9691 Respiratory failure, unspecified with hypoxia: Secondary | ICD-10-CM | POA: Diagnosis not present

## 2018-08-01 DIAGNOSIS — Z01818 Encounter for other preprocedural examination: Secondary | ICD-10-CM | POA: Diagnosis not present

## 2018-08-01 DIAGNOSIS — I6783 Posterior reversible encephalopathy syndrome: Secondary | ICD-10-CM | POA: Diagnosis present

## 2018-08-01 DIAGNOSIS — R29818 Other symptoms and signs involving the nervous system: Secondary | ICD-10-CM | POA: Diagnosis not present

## 2018-08-01 DIAGNOSIS — J9601 Acute respiratory failure with hypoxia: Secondary | ICD-10-CM

## 2018-08-01 DIAGNOSIS — G936 Cerebral edema: Secondary | ICD-10-CM | POA: Diagnosis not present

## 2018-08-01 DIAGNOSIS — Z4682 Encounter for fitting and adjustment of non-vascular catheter: Secondary | ICD-10-CM | POA: Diagnosis not present

## 2018-08-01 DIAGNOSIS — I340 Nonrheumatic mitral (valve) insufficiency: Secondary | ICD-10-CM | POA: Diagnosis present

## 2018-08-01 DIAGNOSIS — R471 Dysarthria and anarthria: Secondary | ICD-10-CM | POA: Diagnosis present

## 2018-08-01 DIAGNOSIS — Z7722 Contact with and (suspected) exposure to environmental tobacco smoke (acute) (chronic): Secondary | ICD-10-CM | POA: Diagnosis present

## 2018-08-01 DIAGNOSIS — I609 Nontraumatic subarachnoid hemorrhage, unspecified: Secondary | ICD-10-CM | POA: Diagnosis not present

## 2018-08-01 DIAGNOSIS — E872 Acidosis: Secondary | ICD-10-CM | POA: Diagnosis not present

## 2018-08-01 DIAGNOSIS — K219 Gastro-esophageal reflux disease without esophagitis: Secondary | ICD-10-CM | POA: Diagnosis present

## 2018-08-01 DIAGNOSIS — I739 Peripheral vascular disease, unspecified: Secondary | ICD-10-CM | POA: Diagnosis present

## 2018-08-01 DIAGNOSIS — R410 Disorientation, unspecified: Secondary | ICD-10-CM | POA: Diagnosis not present

## 2018-08-01 DIAGNOSIS — E785 Hyperlipidemia, unspecified: Secondary | ICD-10-CM | POA: Diagnosis present

## 2018-08-01 DIAGNOSIS — I429 Cardiomyopathy, unspecified: Secondary | ICD-10-CM | POA: Diagnosis present

## 2018-08-01 DIAGNOSIS — I5021 Acute systolic (congestive) heart failure: Secondary | ICD-10-CM | POA: Diagnosis not present

## 2018-08-01 DIAGNOSIS — I6319 Cerebral infarction due to embolism of other precerebral artery: Secondary | ICD-10-CM | POA: Diagnosis not present

## 2018-08-01 DIAGNOSIS — Z79899 Other long term (current) drug therapy: Secondary | ICD-10-CM

## 2018-08-01 DIAGNOSIS — Z90721 Acquired absence of ovaries, unilateral: Secondary | ICD-10-CM

## 2018-08-01 DIAGNOSIS — Z880 Allergy status to penicillin: Secondary | ICD-10-CM

## 2018-08-01 DIAGNOSIS — I63412 Cerebral infarction due to embolism of left middle cerebral artery: Principal | ICD-10-CM | POA: Diagnosis present

## 2018-08-01 DIAGNOSIS — M810 Age-related osteoporosis without current pathological fracture: Secondary | ICD-10-CM | POA: Diagnosis present

## 2018-08-01 DIAGNOSIS — Z825 Family history of asthma and other chronic lower respiratory diseases: Secondary | ICD-10-CM

## 2018-08-01 DIAGNOSIS — Z781 Physical restraint status: Secondary | ICD-10-CM

## 2018-08-01 DIAGNOSIS — Z7982 Long term (current) use of aspirin: Secondary | ICD-10-CM

## 2018-08-01 DIAGNOSIS — Z7951 Long term (current) use of inhaled steroids: Secondary | ICD-10-CM

## 2018-08-01 DIAGNOSIS — G4489 Other headache syndrome: Secondary | ICD-10-CM | POA: Diagnosis not present

## 2018-08-01 DIAGNOSIS — I629 Nontraumatic intracranial hemorrhage, unspecified: Secondary | ICD-10-CM | POA: Diagnosis not present

## 2018-08-01 DIAGNOSIS — I6200 Nontraumatic subdural hemorrhage, unspecified: Secondary | ICD-10-CM | POA: Diagnosis not present

## 2018-08-01 DIAGNOSIS — I4819 Other persistent atrial fibrillation: Secondary | ICD-10-CM | POA: Diagnosis not present

## 2018-08-01 DIAGNOSIS — Z9049 Acquired absence of other specified parts of digestive tract: Secondary | ICD-10-CM

## 2018-08-01 DIAGNOSIS — I6300 Cerebral infarction due to thrombosis of unspecified precerebral artery: Secondary | ICD-10-CM | POA: Diagnosis not present

## 2018-08-01 DIAGNOSIS — R4020 Unspecified coma: Secondary | ICD-10-CM | POA: Diagnosis not present

## 2018-08-01 DIAGNOSIS — I771 Stricture of artery: Secondary | ICD-10-CM | POA: Diagnosis not present

## 2018-08-01 DIAGNOSIS — R0902 Hypoxemia: Secondary | ICD-10-CM | POA: Diagnosis not present

## 2018-08-01 LAB — URINALYSIS, COMPLETE (UACMP) WITH MICROSCOPIC
Bilirubin Urine: NEGATIVE
Glucose, UA: NEGATIVE mg/dL
KETONES UR: 5 mg/dL — AB
Nitrite: NEGATIVE
Protein, ur: 100 mg/dL — AB
Specific Gravity, Urine: 1.026 (ref 1.005–1.030)
pH: 6 (ref 5.0–8.0)

## 2018-08-01 LAB — CBC
HCT: 40.9 % (ref 36.0–46.0)
HEMOGLOBIN: 12.4 g/dL (ref 12.0–15.0)
MCH: 26.7 pg (ref 26.0–34.0)
MCHC: 30.3 g/dL (ref 30.0–36.0)
MCV: 88.1 fL (ref 80.0–100.0)
Platelets: 367 10*3/uL (ref 150–400)
RBC: 4.64 MIL/uL (ref 3.87–5.11)
RDW: 14.7 % (ref 11.5–15.5)
WBC: 11.4 10*3/uL — AB (ref 4.0–10.5)
nRBC: 0 % (ref 0.0–0.2)

## 2018-08-01 LAB — COMPREHENSIVE METABOLIC PANEL
ALBUMIN: 3.6 g/dL (ref 3.5–5.0)
ALK PHOS: 76 U/L (ref 38–126)
ALT: 21 U/L (ref 0–44)
AST: 25 U/L (ref 15–41)
Anion gap: 9 (ref 5–15)
BUN: 13 mg/dL (ref 8–23)
CHLORIDE: 102 mmol/L (ref 98–111)
CO2: 23 mmol/L (ref 22–32)
Calcium: 9 mg/dL (ref 8.9–10.3)
Creatinine, Ser: 0.6 mg/dL (ref 0.44–1.00)
GFR calc Af Amer: >60 mL/min (ref >60)
GFR calc non Af Amer: >60 mL/min (ref >60)
GLUCOSE: 118 mg/dL — AB (ref 70–99)
Potassium: 3.2 mmol/L — ABNORMAL LOW (ref 3.5–5.1)
SODIUM: 134 mmol/L — AB (ref 135–145)
TOTAL PROTEIN: 6.6 g/dL (ref 6.5–8.1)
Total Bilirubin: 0.6 mg/dL (ref 0.3–1.2)

## 2018-08-01 LAB — RAPID URINE DRUG SCREEN, HOSP PERFORMED
Amphetamines: NOT DETECTED
BARBITURATES: NOT DETECTED
Benzodiazepines: NOT DETECTED
Cocaine: NOT DETECTED
Opiates: NOT DETECTED
TETRAHYDROCANNABINOL: NOT DETECTED

## 2018-08-01 LAB — I-STAT CHEM 8, ED
BUN: 15 mg/dL (ref 8–23)
CHLORIDE: 104 mmol/L (ref 98–111)
CREATININE: 0.5 mg/dL (ref 0.44–1.00)
Calcium, Ion: 1.1 mmol/L — ABNORMAL LOW (ref 1.15–1.40)
GLUCOSE: 117 mg/dL — AB (ref 70–99)
HCT: 39 % (ref 36.0–46.0)
Hemoglobin: 13.3 g/dL (ref 12.0–15.0)
Potassium: 3.3 mmol/L — ABNORMAL LOW (ref 3.5–5.1)
Sodium: 136 mmol/L (ref 135–145)
TCO2: 23 mmol/L (ref 22–32)

## 2018-08-01 LAB — DIFFERENTIAL
ABS IMMATURE GRANULOCYTES: 0.03 10*3/uL (ref 0.00–0.07)
BASOS ABS: 0 10*3/uL (ref 0.0–0.1)
BASOS PCT: 0 %
Eosinophils Absolute: 0.3 10*3/uL (ref 0.0–0.5)
Eosinophils Relative: 2 %
Immature Granulocytes: 0 %
LYMPHS PCT: 18 %
Lymphs Abs: 2 10*3/uL (ref 0.7–4.0)
Monocytes Absolute: 0.9 10*3/uL (ref 0.1–1.0)
Monocytes Relative: 8 %
NEUTROS ABS: 8.2 10*3/uL — AB (ref 1.7–7.7)
NEUTROS PCT: 72 %

## 2018-08-01 LAB — I-STAT TROPONIN, ED: Troponin i, poc: 0.01 ng/mL (ref 0.00–0.08)

## 2018-08-01 LAB — PROTIME-INR
INR: 1.02
Prothrombin Time: 13.3 seconds (ref 11.4–15.2)

## 2018-08-01 LAB — APTT: aPTT: 25 seconds (ref 24–36)

## 2018-08-01 LAB — FIBRINOGEN: Fibrinogen: 380 mg/dL (ref 210–475)

## 2018-08-01 LAB — MRSA PCR SCREENING: MRSA by PCR: NEGATIVE

## 2018-08-01 LAB — CBG MONITORING, ED: Glucose-Capillary: 110 mg/dL — ABNORMAL HIGH (ref 70–99)

## 2018-08-01 LAB — ETHANOL

## 2018-08-01 MED ORDER — CALCIUM CARBONATE-VITAMIN D 500-200 MG-UNIT PO TABS
1.0000 | ORAL_TABLET | Freq: Two times a day (BID) | ORAL | Status: DC
  Administered 2018-08-03 – 2018-08-06 (×7): 1 via ORAL
  Filled 2018-08-01 (×12): qty 1

## 2018-08-01 MED ORDER — SODIUM CHLORIDE 0.9 % IV SOLN
INTRAVENOUS | Status: DC
  Administered 2018-08-02 – 2018-08-04 (×2): via INTRAVENOUS

## 2018-08-01 MED ORDER — PANTOPRAZOLE SODIUM 40 MG IV SOLR
40.0000 mg | Freq: Every day | INTRAVENOUS | Status: DC
  Administered 2018-08-01 – 2018-08-06 (×6): 40 mg via INTRAVENOUS
  Filled 2018-08-01 (×6): qty 40

## 2018-08-01 MED ORDER — HALOPERIDOL LACTATE 5 MG/ML IJ SOLN
2.0000 mg | Freq: Once | INTRAMUSCULAR | Status: AC
  Administered 2018-08-01: 2 mg via INTRAVENOUS

## 2018-08-01 MED ORDER — SENNOSIDES-DOCUSATE SODIUM 8.6-50 MG PO TABS: 1.0000 | ORAL_TABLET | Freq: Every evening | ORAL | Status: DC | PRN

## 2018-08-01 MED ORDER — IOPAMIDOL (ISOVUE-370) INJECTION 76%
100.0000 mL | Freq: Once | INTRAVENOUS | Status: AC | PRN
  Administered 2018-08-01: 100 mL via INTRAVENOUS

## 2018-08-01 MED ORDER — SODIUM CHLORIDE 0.9 % IV SOLN
2.0000 g | Freq: Two times a day (BID) | INTRAVENOUS | Status: DC
  Administered 2018-08-02 – 2018-08-03 (×3): 2 g via INTRAVENOUS
  Filled 2018-08-01 (×4): qty 2

## 2018-08-01 MED ORDER — ALTEPLASE (STROKE) FULL DOSE INFUSION
0.9000 mg/kg | Freq: Once | INTRAVENOUS | Status: AC
  Administered 2018-08-01: 46.2 mg via INTRAVENOUS
  Filled 2018-08-01: qty 100

## 2018-08-01 MED ORDER — CALCIUM CARBONATE-VITAMIN D 600-400 MG-UNIT PO TABS: 1.0000 | ORAL_TABLET | Freq: Two times a day (BID) | ORAL | Status: DC

## 2018-08-01 MED ORDER — HALOPERIDOL LACTATE 5 MG/ML IJ SOLN
0.5000 mg | Freq: Once | INTRAMUSCULAR | Status: DC
  Filled 2018-08-01: qty 1

## 2018-08-01 MED ORDER — LORAZEPAM 2 MG/ML IJ SOLN
INTRAMUSCULAR | Status: AC
  Filled 2018-08-01: qty 1

## 2018-08-01 MED ORDER — VANCOMYCIN HCL IN DEXTROSE 750-5 MG/150ML-% IV SOLN
750.0000 mg | INTRAVENOUS | Status: DC
  Administered 2018-08-02: 750 mg via INTRAVENOUS
  Filled 2018-08-01: qty 150

## 2018-08-01 MED ORDER — CLEVIDIPINE BUTYRATE 0.5 MG/ML IV EMUL
0.0000 mg/h | INTRAVENOUS | Status: DC
  Administered 2018-08-01: 1 mg/h via INTRAVENOUS
  Administered 2018-08-01: 6 mg/h via INTRAVENOUS
  Administered 2018-08-01: 10 mg/h via INTRAVENOUS
  Filled 2018-08-01 (×3): qty 50

## 2018-08-01 MED ORDER — LORAZEPAM 2 MG/ML IJ SOLN
1.0000 mg | INTRAMUSCULAR | Status: AC | PRN
  Administered 2018-08-01 – 2018-08-02 (×3): 1 mg via INTRAVENOUS
  Filled 2018-08-01 (×2): qty 1

## 2018-08-01 MED ORDER — LORAZEPAM 2 MG/ML IJ SOLN
2.0000 mg | Freq: Once | INTRAMUSCULAR | Status: AC
  Administered 2018-08-01: 2 mg via INTRAVENOUS

## 2018-08-01 MED ORDER — ATORVASTATIN CALCIUM 20 MG PO TABS
20.0000 mg | ORAL_TABLET | Freq: Every day | ORAL | Status: DC
  Administered 2018-08-04 – 2018-08-06 (×3): 20 mg via ORAL
  Filled 2018-08-01 (×3): qty 1

## 2018-08-01 MED ORDER — ACETAMINOPHEN 650 MG RE SUPP
650.0000 mg | RECTAL | Status: DC | PRN
  Administered 2018-08-01: 650 mg via RECTAL
  Filled 2018-08-01: qty 1

## 2018-08-01 MED ORDER — VANCOMYCIN HCL IN DEXTROSE 1-5 GM/200ML-% IV SOLN
1000.0000 mg | Freq: Once | INTRAVENOUS | Status: AC
  Administered 2018-08-01: 1000 mg via INTRAVENOUS
  Filled 2018-08-01: qty 200

## 2018-08-01 MED ORDER — SODIUM CHLORIDE 0.9 % IV SOLN
INTRAVENOUS | Status: DC
  Administered 2018-08-01 – 2018-08-05 (×3): via INTRAVENOUS

## 2018-08-01 MED ORDER — STROKE: EARLY STAGES OF RECOVERY BOOK
Freq: Once | Status: DC
  Filled 2018-08-01: qty 1

## 2018-08-01 MED ORDER — SODIUM CHLORIDE 0.9 % IV SOLN
2.5000 mg/kg | INTRAVENOUS | Status: DC
  Administered 2018-08-01 – 2018-08-02 (×2): 130 mg via INTRAVENOUS
  Filled 2018-08-01 (×2): qty 2.6

## 2018-08-01 MED ORDER — AMITRIPTYLINE HCL 25 MG PO TABS
25.0000 mg | ORAL_TABLET | Freq: Every day | ORAL | Status: DC
  Administered 2018-08-03 – 2018-08-05 (×3): 25 mg via ORAL
  Filled 2018-08-01 (×6): qty 1

## 2018-08-01 NOTE — ED Notes (Signed)
Patient given medications that would alter modified NIH assessments (2mg  ativan and 2mg  haldol). Q15 minute neuro checks reflect mental status change caused by medication administration, not TPA administration. MD Aurora made aware.

## 2018-08-01 NOTE — Progress Notes (Signed)
Kirkpatrick at bedside, verbal order for SBP to be < 140 for new SAH.

## 2018-08-01 NOTE — Progress Notes (Addendum)
Pt constantly moving/restless since admission to [facility], unable to obtain accurate blood pressures r/t constant movement.  Stroke team and Rapid Response RNs at bedside.  Every extremity was attempted, multiple cuffs, multiple attempts.  Dr Wilford Corner notified as well, orders for 1 mg Ativan ordered in attempt to get patient calm enough to obtain accurate blood pressure.  Cannot reliably titrate cleviprex until blood pressures are accurate/consistent.

## 2018-08-01 NOTE — Code Documentation (Signed)
84 yo Caucasian female coming from [facility] independent living. Pt talked with her family at 1200, and she was noted to be at baseline. Pt was found unable to speak clearly and confused. EMS was called and brought patient into ED. When patient arrived, pt was activated as a Code Stroke. CT Head negative for hemorrhage. Neurology spoke with family to clarify story. Delay was noted. Pt needed second IV. She was agitated and would not lay down for CTA. Pt given 2 mg of Haldol. No change in patient's agitation. 2 mg of Ativan given. Pt was able to be laid down for CTA. tPA candidate. BP under control. tPA given. Pt to be admitted to [facility] [place]. Marcelino Duster, RN given handoff.

## 2018-08-01 NOTE — Progress Notes (Addendum)
Patient's mental status continued to be depressed, stat CT demonstrates small intraparenchymal hematoma in the left parietal region, I suspect that she had some degree of infarction there which has had some hemorrhagic conversion.  She continues to be very encephalopathic and has spiked a fever to 103.  She does appear to have a slightly stiff neck, there is difficult to be certain given that she is resisting all movements.  Unfortunately due to her TPA, no LP can be performed at this time.  I would favor empiric meningitic coverage at this time and I have ordered such.  Also check stat fibrinogen level, and replete if still low.  Ritta Slot, MD [facility] [phone]  If 7pm- 7am, please page neurology on call as listed in AMION. 08/12/2018  10:08 PM

## 2018-08-01 NOTE — H&P (Signed)
STROKE H&P  Reason for Consult: Altered mental status Referring Physician: Denton Lank  CC: Altered mental status  History is obtained from: Chart, EMS  HPI: Victoria Joseph is a 84 y.o. female with history of PVD, hyperlipidemia, hypertension, anxiety.  Patient was transferred here from [facility] with altered mental status.  Last seen normal at 12 PM today when family member was speaking to her on the phone.  Patient was now unable to state her name and appears very confused.  She also complains of nausea and headache per EMS.  Per family at baseline patient is very alert and oriented.  She lives in an assisted living.  She drove herself to the beach just this weekend.  This is a sudden onset change for her.  ED course: CT head, CTA head neck  LKW: 12 PM on 08/19/2018 tpa given?:  Yes Premorbid modified Rankin scale (mRS): 0 NIH stroke scale: 1a Level of Conscious.: 0 1b LOC Questions: 1 1c LOC Commands: 1 2 Best Gaze: 0 3 Visual: 0 4 Facial Palsy: 1 5a Motor Arm - left: 0 5b Motor Arm - Right: 0 6a Motor Leg - Left: 0 6b Motor Leg - Right: 0 7 Limb Ataxia: 0 8 Sensory: 0 9 Best Language: 2 10 Dysarthria: 1 11 Extinct. and Inatten.: 2 TOTAL: 6  ROS: Unable to obtain due to altered mental status.   Past Medical History:  Diagnosis Date  . Anxiety   . Arrhythmia    PVCs  . Diverticulosis   . GERD (gastroesophageal reflux disease)   . Heart murmur   . Hiatal hernia   . Hyperlipidemia   . Hypertension   . IBS (irritable bowel syndrome)   . Osteoarthritis   . Osteoporosis   . Pneumonia 2009  . PVD (peripheral vascular disease) (HCC)   . Rhinitis, allergic   . Sinusitis   . URI (upper respiratory infection)    Family History  Problem Relation Age of Onset  . Heart failure Mother   . Asthma Paternal Grandfather   . Colon cancer Neg Hx   . Heart attack Neg Hx   . Stroke Neg Hx   . Hypertension Neg Hx    Social History:   reports that she has never smoked. She  has never used smokeless tobacco. She reports that she does not drink alcohol or use drugs.  Medications  Current Facility-Administered Medications:  .  0.9 %  sodium chloride infusion, , Intravenous, Continuous, Cathren Laine, MD  Current Outpatient Medications:  .  amitriptyline (ELAVIL) 25 MG tablet, Take 25 mg by mouth at bedtime. , Disp: , Rfl:  .  aspirin EC 81 MG tablet, Take 81 mg by mouth daily., Disp: , Rfl:  .  atenolol (TENORMIN) 50 MG tablet, Take 1 tablet (50 mg total) by mouth 2 (two) times daily., Disp: 180 tablet, Rfl: 3 .  atorvastatin (LIPITOR) 20 MG tablet, Take 1 tablet (20 mg total) by mouth daily., Disp: 90 tablet, Rfl: 3 .  Calcium Carbonate-Vitamin D (CALCIUM 600+D) 600-400 MG-UNIT tablet, Take 1 tablet by mouth 2 (two) times daily., Disp: , Rfl:  .  chlorthalidone (HYGROTON) 25 MG tablet, Take 0.5 tablets (12.5 mg total) by mouth daily., Disp: 45 tablet, Rfl: 3 .  cholecalciferol (VITAMIN D) 1000 units tablet, Take 1,000 Units by mouth daily., Disp: , Rfl:  .  cilostazol (PLETAL) 100 MG tablet, Take 100 mg by mouth 2 (two) times daily. , Disp: , Rfl:  .  colestipol (COLESTID)  1 g tablet, Take 1 tablet (1 g total) by mouth 2 (two) times daily., Disp: 60 tablet, Rfl: 11 .  ESTRACE VAGINAL 0.1 MG/GM vaginal cream, Place 1 Applicatorful vaginally 2 (two) times a week. , Disp: , Rfl:  .  ferrous sulfate 325 (65 FE) MG tablet, Take 325 mg by mouth 2 (two) times daily., Disp: , Rfl: 5 .  fexofenadine (ALLEGRA) 180 MG tablet, Take 180 mg by mouth daily., Disp: , Rfl:  .  fluticasone (FLONASE) 50 MCG/ACT nasal spray, Place 2 sprays into both nostrils daily as needed for rhinitis. , Disp: , Rfl:  .  Multiple Vitamin (MULTIVITAMIN WITH MINERALS) TABS tablet, Take 1 tablet by mouth daily., Disp: , Rfl:  .  potassium chloride SA (K-DUR,KLOR-CON) 20 MEQ tablet, Take 1 tablet (20 mEq total) by mouth daily., Disp: 90 tablet, Rfl: 3 .  vitamin C (ASCORBIC ACID) 500 MG tablet, Take 500  mg by mouth at bedtime. , Disp: , Rfl:  .  zoledronic acid (RECLAST) 5 MG/100ML SOLN, Inject 5 mg into the vein once. Pt receives injection once per year., Disp: , Rfl:   Exam: Current vital signs: BP (!) 157/108   Pulse 94   Resp (!) 26   Ht 4\' 11"  (1.499 m)   Wt 51.3 kg   SpO2 100%   BMI 22.84 kg/m  Vital signs in last 24 hours: Pulse Rate:  [59-105] 94 (11/11 1645) Resp:  [12-34] 26 (11/11 1645) BP: (86-157)/(31-126) 157/108 (11/11 1645) SpO2:  [87 %-100 %] 100 % (11/11 1645) Weight:  [51.3 kg] 51.3 kg (11/11 1442)  Physical Exam  Constitutional: Appears well-developed and well-nourished.  Psych: Affect appropriate to situation Eyes: No scleral injection HENT: No OP obstrucion Head: Normocephalic.  Cardiovascular: Normal rate and regular rhythm.  Respiratory: Effort normal, non-labored breathing GI: Soft.  No distension. There is no tenderness.  Skin: [facility] NEUROLOGICAL EXAM Awake, alert, oriented to self, could not tell me where she is or what month it is. York Spaniel her age to be 7 Speech mildly dysarthric. Naming impaired, comprehension inconsistent for simple commands, could not follow multistep  Commands. Repetition impaired. CN: PERRL, blinks to threat b/l, face ?right NLF flattening. Auditory acuity decreased b/l Motor: at different points in the exam, moved all 4 antigravity but poor attention and concentration persisted and would not do all the exam at one time. Sensory: appears intact to nox stim all over Coord: no obvious ataxia based on reach for objects. Gait can not be tested.  Labs I have reviewed labs in epic and the results pertinent to this consultation are:  CBC    Component Value Date/Time   WBC 9.3 11/16/2016 1723   RBC 4.44 11/16/2016 1723   HGB 13.3 08/08/2018 1501   HCT 39.0 07/31/2018 1501   PLT 396 11/16/2016 1723   MCV 84.2 11/16/2016 1723   MCH 28.4 11/16/2016 1723   MCHC 33.7 11/16/2016 1723   RDW 13.3 11/16/2016 1723   LYMPHSABS 1.9  12/14/2009 1738   MONOABS 0.5 12/14/2009 1738   EOSABS 0.3 12/14/2009 1738   BASOSABS 0.0 12/14/2009 1738    CMP     Component Value Date/Time   NA 136 08/12/2018 1501   K 3.3 (L) 07/26/2018 1501   CL 104 07/30/2018 1501   CO2 26 11/16/2016 1723   GLUCOSE 117 (H) 07/27/2018 1501   BUN 15 08/10/2018 1501   CREATININE 0.50 08/05/2018 1501   CALCIUM 9.8 11/16/2016 1723   PROT 7.0 11/16/2016 1723  ALBUMIN 4.0 11/16/2016 1723   AST 22 11/16/2016 1723   ALT 14 11/16/2016 1723   ALKPHOS 67 11/16/2016 1723   BILITOT 0.3 11/16/2016 1723   GFRNONAA >60 11/16/2016 1723   GFRAA >60 11/16/2016 1723   Imaging I have reviewed the images obtained: CT-scan of the brain-significantly motion degraded without acute findings CTA of head and neck, CT perfusion no LVO, no perfusion deficit   Felicie Morn PA-C [facility] [phone]  M-F  (9:00 am- 5:00 PM)  August 19, 2018, 3:05 PM   ATTENDING ADDENDUM Pt seen and examined independently. CT and CTA reviewed. CT-scan of the brain-significantly motion degraded without acute findings CTA of head and neck, CT perfusion no LVO, no perfusion deficit   Assessment:  84 year old female presenting to the hospital after sudden onset of confusion associated with a aphasia Disabling symptoms of aphasia in an otherwise very functional woman would be devasting for her independent living. Family arrived a little while later - tPA option discussed, risks/beneifts discussed and decision made to [facility] after family's consent. No EVT as there is no evidence of LVO Delays in the process included patient's extreme agitation, preventing gaining IV access, needing Haldol and Ativan to acquire imaging and IV access.  Plan: Admit to ICU  Acute Ischemic Stroke Cerebral infarction due to embolism of left middle cerebral artery   Acuity: Acute Current Suspected Etiology: cardioembolic vs Artery to artery embolism Continue Evaluation: Of altered mental  status and aphasia -Admit to: ICU s/p tPA -Continue Statin -Blood pressure control, goal of SYS <180 -MRI/ECHO/A1C/Lipid panel. -Hyperglycemia management per SSI to maintain glucose 140-180mg /dL. -PT/OT/ST therapies and recommendations when able  CNS -Repeat imaging 24h post tPA -Close neuro monitoring  Dysarthria Dysphagia following cerebral infarction  -NPO until cleared by speech -ST -Advance diet as tolerated -PT/OT  Possible encephalopathy -Correct metabolic causes -Monitor  RESP -Patient has no congestive heart failure or respiratory issues noted in her past medical history but will monitor very closely  CV Essential (primary) hypertension -Aggressive BP control, goal SBP <170 -Titrate IV agents  -TTE -Hyperlipidemia, unspecified  - Statin for goal LDL < 70  telemetry to look for paroxysmal A. fib   HEME Normal hemoglobin and hematocrit s -We will continue to monitor -transfuse for hgb < 7   ENDO -No current problems  GI/GU -No current problems  Fluid/Electrolyte Disorders -Replete -Repeat labs  ID Possible UTI -We will obtain today   Prophylaxis DVT: SCDs GI: Protonix Bowel: Senokot  Diet: NPO until cleared by speech  Code Status: Full Code   THE FOLLOWING WERE PRESENT ON ADMISSION:  Acute Ischemic Stroke, dysarthria, aphasia   -- Milon Dikes, MD [facility] Pager: [phone] If 7pm to 7am, please call on call as listed on AMION.   CRITICAL CARE ATTESTATION This patient is critically ill and at significant risk of neurological worsening, death and care requires constant monitoring of vital signs, hemodynamics, respiratory, and cardiac monitoring. I spent 45  minutes of neurocritical care time performing neurological assessment, discussion with family, other specialists and medical decision making of high complexity in the care of  this patient.

## 2018-08-01 NOTE — Progress Notes (Signed)
Paged Amada Jupiter on pts neuro status, STAT head CT ordered and completed

## 2018-08-01 NOTE — Progress Notes (Addendum)
Pharmacy Antibiotic Note  Victoria Joseph is a 84 y.o. female admitted on 08-19-2018 with meningitis.  Pharmacy has been consulted for Ganciclovir, Meropenem, and Vancomycin  dosing.  Plan: Start Ganciclovir 130 mg (2.5 mg/kg) IV q24hr Start Meropenem 2 grams IV q12hr Start Vancomycin 1000 mg IV x 1, then 750 mg IV q24hr Will monitor renal function and cultures and sensitivities Will obtain vancomycin levels at steady state  Height: 4\' 11"  (149.9 cm) Weight: 113 lb 1.5 oz (51.3 kg) IBW/kg (Calculated) : 43.2  Temp (24hrs), Avg:103.1 F (39.5 C), Min:103.1 F (39.5 C), Max:103.1 F (39.5 C)  Recent Labs  Lab 08-19-2018 1446 August 19, 2018 1501  WBC 11.4*  --   CREATININE 0.60 0.50    Estimated Creatinine Clearance: 35.7 mL/min (by C-G formula based on SCr of 0.5 mg/dL).    Allergies  Allergen Reactions  . Penicillins Hives, Other (See Comments) and Rash    Has patient had a PCN reaction causing immediate rash, facial/tongue/throat swelling, SOB or lightheadedness with hypotension: No Has patient had a PCN reaction causing severe rash involving mucus membranes or skin necrosis: No Has patient had a PCN reaction that required hospitalization No Has patient had a PCN reaction occurring within the last 10 years: No If all of the above answers are "NO", then may proceed with Cephalosporin use.  . Sulfa Antibiotics     nausea    Antimicrobials this admission: Ganciclovir 11/11 >>  Meropenem 11/11 >>  Vancomycin 11/11 >>  Thank you for allowing pharmacy to be a part of this patient's care.  Jeanella Cara, PharmD, [facility] Clinical Pharmacist Please see AMION for all Pharmacists' Contact Phone Numbers 08/19/2018, 9:39 PM

## 2018-08-01 NOTE — Progress Notes (Signed)
Pharmacist Code Stroke Response  Notified to mix tPA at 1521 by Dr. Wilford Corner Delivered tPA to RN at 1524  tPA dose = 4.6mg  bolus over 1 minute followed by 41.6mg  for a total dose of 46.2mg  over 1 hour  Issues/delays encountered (if applicable): tPA administration delayed slightly by patient extremely agitated needing Haldol and Ativan.  Victoria Joseph, Pharmacy Student  Victoria Joseph, PharmD, BCPS Please check AMION for all [facility] contact numbers Clinical Pharmacist 08/05/2018 3:43 PM

## 2018-08-01 NOTE — ED Triage Notes (Signed)
Patient here from [place] with AMS. Last seen normal A/Ox4 today at 1200 when family member was speaking to her on the phone. Patient now unable to state her name and very confused. C/o nausea and headache for EMS. Unable to follow commands for EMS. Given 4mg  zofran IV by EMS.

## 2018-08-01 NOTE — ED Provider Notes (Signed)
[facility] EMERGENCY DEPARTMENT Provider Note   CSN: [id number] Arrival date & time: 07/22/2018  1440  Htn,    History   Chief Complaint Chief Complaint  Patient presents with  . Altered Mental Status    HPI Victoria Joseph is a 84 y.o. female.  Patient with hx htn, presents with acute alteration of mental status, onset today around 12 noon. EMS notes at baseline is alert, conversant, oriented. Since 12 noon, pt notes to appear confused, have trouble finding words, non sensical speech. EMS notes glucose normal. On arrival is awake and alert, confused - level 5 caveat. Pt denies fever, denies pain.   The history is provided by the patient and the EMS personnel. The history is limited by the condition of the patient.  Altered Mental Status      Past Medical History:  Diagnosis Date  . Anxiety   . Arrhythmia    PVCs  . Diverticulosis   . GERD (gastroesophageal reflux disease)   . Heart murmur   . Hiatal hernia   . Hyperlipidemia   . Hypertension   . IBS (irritable bowel syndrome)   . Osteoarthritis   . Osteoporosis   . Pneumonia 2009  . PVD (peripheral vascular disease) (HCC)   . Rhinitis, allergic   . Sinusitis   . URI (upper respiratory infection)     Patient Active Problem List   Diagnosis Date Noted  . PVC (premature ventricular contraction) 01/14/2016  . PAD (peripheral artery disease) (HCC) 01/22/2011  . Allergic rhinitis due to pollen 11/27/2010  . PREMATURE VENTRICULAR CONTRACTIONS 04/19/2009  . HYPERLIPIDEMIA 11/06/2008  . Essential hypertension 11/06/2008  . URI 01/17/2008    Past Surgical History:  Procedure Laterality Date  . APPENDECTOMY    . CHOLECYSTECTOMY    . KNEE SURGERY     right repair after fall  . OOPHORECTOMY     still have 1 ovary     OB History   None      Home Medications    Prior to Admission medications   Medication Sig Start Date End Date Taking? Authorizing Provider  amitriptyline (ELAVIL) 25 MG  tablet Take 25 mg by mouth at bedtime.     [provider]  aspirin EC 81 MG tablet Take 81 mg by mouth daily.    [provider]  atenolol (TENORMIN) 50 MG tablet Take 1 tablet (50 mg total) by mouth 2 (two) times daily. 01/18/18   Corky Crafts, MD  atorvastatin (LIPITOR) 20 MG tablet Take 1 tablet (20 mg total) by mouth daily. 01/18/18   Corky Crafts, MD  Calcium Carbonate-Vitamin D (CALCIUM 600+D) 600-400 MG-UNIT tablet Take 1 tablet by mouth 2 (two) times daily.    [provider]  chlorthalidone (HYGROTON) 25 MG tablet Take 0.5 tablets (12.5 mg total) by mouth daily. 03/07/18   Corky Crafts, MD  cholecalciferol (VITAMIN D) 1000 units tablet Take 1,000 Units by mouth daily.    [provider]  cilostazol (PLETAL) 100 MG tablet Take 100 mg by mouth 2 (two) times daily.     [provider]  colestipol (COLESTID) 1 g tablet Take 1 tablet (1 g total) by mouth 2 (two) times daily. 10/25/17   Napoleon Form, MD  ESTRACE VAGINAL 0.1 MG/GM vaginal cream Place 1 Applicatorful vaginally 2 (two) times a week.     [provider]  ferrous sulfate 325 (65 FE) MG tablet Take 325 mg by mouth 2 (two)  times daily. 01/10/18   [provider]  fexofenadine (ALLEGRA) 180 MG tablet Take 180 mg by mouth daily.    [provider]  fluticasone (FLONASE) 50 MCG/ACT nasal spray Place 2 sprays into both nostrils daily as needed for rhinitis.     [provider]  Multiple Vitamin (MULTIVITAMIN WITH MINERALS) TABS tablet Take 1 tablet by mouth daily.    [provider]  potassium chloride SA (K-DUR,KLOR-CON) 20 MEQ tablet Take 1 tablet (20 mEq total) by mouth daily. 01/18/18   Corky Crafts, MD  vitamin C (ASCORBIC ACID) 500 MG tablet Take 500 mg by mouth at bedtime.     [provider]  zoledronic acid (RECLAST) 5 MG/100ML SOLN Inject 5 mg into the vein once. Pt receives injection once per year.     [provider]  famotidine (PEPCID) 20 MG tablet Take 20 mg by mouth daily.    12/15/11  [provider]  Potassium Chloride (KLOR-CON 10 PO) Take 10 mEq by mouth daily.    12/15/11  [provider]    Family History Family History  Problem Relation Age of Onset  . Heart failure Mother   . Asthma Paternal Grandfather   . Colon cancer Neg Hx   . Heart attack Neg Hx   . Stroke Neg Hx   . Hypertension Neg Hx     Social History Social History   Tobacco Use  . Smoking status: Never Smoker  . Smokeless tobacco: Never Used  . Tobacco comment: has exposure to 2nd hand smoke  Substance Use Topics  . Alcohol use: No  . Drug use: No     Allergies   Penicillins and Sulfa antibiotics   Review of Systems Review of Systems  Unable to perform ROS: Mental status change  level 5 caveat - confusion, altered mental status.    Physical Exam Updated Vital Signs There were no vitals taken for this visit.  Physical Exam  Constitutional: She appears well-developed and well-nourished. No distress.  HENT:  Head: Atraumatic.  Mouth/Throat: Oropharynx is clear and moist.  Eyes: Pupils are equal, round, and reactive to light. Conjunctivae are normal. No scleral icterus.  Neck: Neck supple. No tracheal deviation present.  No bruits. No stiffness or rigidity.   Cardiovascular: Normal rate, regular rhythm, normal heart sounds and intact distal pulses.  Pulmonary/Chest: Effort normal and breath sounds normal. No respiratory distress.  Abdominal: Soft. Normal appearance and bowel sounds are normal. She exhibits no distension. There is no tenderness. There is no guarding.  Genitourinary:  Genitourinary Comments: No cva tenderness.   Musculoskeletal: She exhibits no edema or tenderness.  Neurological: She is alert.  Awake and alert. Moves bil extremities purposefully with good strength. Speech is confused, trouble finding words, saying inappropriate words.   Skin: Skin  is warm and dry. No rash noted.  Psychiatric: She has a normal mood and affect.  Nursing note and vitals reviewed.    ED Treatments / Results  Labs (all labs ordered are listed, but only abnormal results are displayed) Results for orders placed or performed during the hospital encounter of 08/04/2018  Protime-INR  Result Value Ref Range   Prothrombin Time 13.3 11.4 - 15.2 seconds   INR 1.02   APTT  Result Value Ref Range   aPTT 25 24 - 36 seconds  CBC  Result Value Ref Range   WBC 11.4 (H) 4.0 - 10.5 K/uL   RBC 4.64 3.87 - 5.11 MIL/uL   Hemoglobin  12.4 12.0 - 15.0 g/dL   HCT 91.4 78.2 - 95.6 %   MCV 88.1 80.0 - 100.0 fL   MCH 26.7 26.0 - 34.0 pg   MCHC 30.3 30.0 - 36.0 g/dL   RDW 21.3 08.6 - 57.8 %   Platelets 367 150 - 400 K/uL   nRBC 0.0 0.0 - 0.2 %  Differential  Result Value Ref Range   Neutrophils Relative % 72 %   Neutro Abs 8.2 (H) 1.7 - 7.7 K/uL   Lymphocytes Relative 18 %   Lymphs Abs 2.0 0.7 - 4.0 K/uL   Monocytes Relative 8 %   Monocytes Absolute 0.9 0.1 - 1.0 K/uL   Eosinophils Relative 2 %   Eosinophils Absolute 0.3 0.0 - 0.5 K/uL   Basophils Relative 0 %   Basophils Absolute 0.0 0.0 - 0.1 K/uL   Immature Granulocytes 0 %   Abs Immature Granulocytes 0.03 0.00 - 0.07 K/uL  I-Stat Chem 8, ED  Result Value Ref Range   Sodium 136 135 - 145 mmol/L   Potassium 3.3 (L) 3.5 - 5.1 mmol/L   Chloride 104 98 - 111 mmol/L   BUN 15 8 - 23 mg/dL   Creatinine, Ser 4.69 0.44 - 1.00 mg/dL   Glucose, Bld 629 (H) 70 - 99 mg/dL   Calcium, Ion 5.28 (L) 1.15 - 1.40 mmol/L   TCO2 23 22 - 32 mmol/L   Hemoglobin 13.3 12.0 - 15.0 g/dL   HCT 41.3 24.4 - 01.0 %  I-stat troponin, ED  Result Value Ref Range   Troponin i, poc 0.01 0.00 - 0.08 ng/mL   Comment 3          CBG monitoring, ED  Result Value Ref Range   Glucose-Capillary 110 (H) 70 - 99 mg/dL   Ct Angio Head W Or Wo Contrast  Result Date: 08/13/2018 CLINICAL DATA:  Acute stroke presentation. Altered level of  consciousness. EXAM: CT ANGIOGRAPHY HEAD AND NECK CT PERFUSION BRAIN TECHNIQUE: Multidetector CT imaging of the head and neck was performed using the standard protocol during bolus administration of intravenous contrast. Multiplanar CT image reconstructions and MIPs were obtained to evaluate the vascular anatomy. Carotid stenosis measurements (when applicable) are obtained utilizing NASCET criteria, using the distal internal carotid diameter as the denominator. Multiphase CT imaging of the brain was performed following IV bolus contrast injection. Subsequent parametric perfusion maps were calculated using RAPID software. CONTRAST:  ISOVUE-370 IOPAMIDOL (ISOVUE-370) INJECTION 76% COMPARISON:  Head CT earlier same day FINDINGS: CTA NECK FINDINGS Aortic arch: Aortic atherosclerosis. No aneurysm or dissection. Branching pattern of the brachiocephalic vessels from the arch is normal. Right carotid system: Common carotid artery widely patent to the bifurcation region. There is soft and calcified plaque at the carotid bifurcation. Minimal diameter of the ICA bulb is 5 mm, this same as the more distal cervical ICA. Therefore no stenosis. Left carotid system: Common carotid artery widely patent to the bifurcation region. There is soft and calcified plaque at the carotid bifurcation and ICA bulb. Minimal diameter of the proximal ICA is 4 mm. Compared to a more distal cervical ICA diameter of 5 mm, this indicates a 20% stenosis. Vertebral arteries: Both subclavian arteries show atherosclerotic change but no flow limiting stenosis. There is atherosclerotic plaque at the right vertebral artery origin with stenosis estimated at 30-50%. The left vertebral artery origin is widely patent. Both vertebral arteries are widely patent beyond that through the cervical region to the foramen magnum. Skeleton: Ordinary cervical spondylosis.  Other neck: No mass or lymphadenopathy. Upper chest: Apical pleural and parenchymal scarring. No  active process. Review of the MIP images confirms the above findings CTA HEAD FINDINGS Anterior circulation: Both internal carotid arteries are patent through the skull base and siphon regions. There is extensive calcification in the carotid siphon regions but no stenosis more than 30% suspected. The anterior and middle cerebral vessels are patent without proximal stenosis, aneurysm or vascular malformation. No large or medium vessel occlusion identified. Posterior circulation: Both vertebral arteries are patent to the basilar. No basilar stenosis. Posterior circulation branch vessels are patent. Venous sinuses: Patent and normal. Anatomic variants: None significant. Delayed phase: No abnormal enhancement. Review of the MIP images confirms the above findings CT Brain Perfusion Findings: CBF (<30%) Volume: 0mL Perfusion (Tmax>6.0s) volume: 0mL Mismatch Volume: 0mL Infarction Location:None IMPRESSION: Negative perfusion study. Atherosclerotic disease at both carotid bifurcations. No stenosis on the right. 20% ICA stenosis on the left. Atherosclerotic calcification in both carotid siphon regions but without stenosis greater than 30%. No intracranial large or medium vessel occlusion or correctable proximal stenosis. 30-50% stenosis at the right vertebral artery origin. These results were communicated to at 4:01 pmon Dec 06, 2019by text page via the [facility] messaging system. Electronically Signed   By: Paulina Fusi M.D.   On: Aug 26, 2018 16:02   Ct Angio Neck W Or Wo Contrast  Result Date: 2018-08-26 CLINICAL DATA:  Acute stroke presentation. Altered level of consciousness. EXAM: CT ANGIOGRAPHY HEAD AND NECK CT PERFUSION BRAIN TECHNIQUE: Multidetector CT imaging of the head and neck was performed using the standard protocol during bolus administration of intravenous contrast. Multiplanar CT image reconstructions and MIPs were obtained to evaluate the vascular anatomy. Carotid stenosis measurements (when applicable) are  obtained utilizing NASCET criteria, using the distal internal carotid diameter as the denominator. Multiphase CT imaging of the brain was performed following IV bolus contrast injection. Subsequent parametric perfusion maps were calculated using RAPID software. CONTRAST:  ISOVUE-370 IOPAMIDOL (ISOVUE-370) INJECTION 76% COMPARISON:  Head CT earlier same day FINDINGS: CTA NECK FINDINGS Aortic arch: Aortic atherosclerosis. No aneurysm or dissection. Branching pattern of the brachiocephalic vessels from the arch is normal. Right carotid system: Common carotid artery widely patent to the bifurcation region. There is soft and calcified plaque at the carotid bifurcation. Minimal diameter of the ICA bulb is 5 mm, this same as the more distal cervical ICA. Therefore no stenosis. Left carotid system: Common carotid artery widely patent to the bifurcation region. There is soft and calcified plaque at the carotid bifurcation and ICA bulb. Minimal diameter of the proximal ICA is 4 mm. Compared to a more distal cervical ICA diameter of 5 mm, this indicates a 20% stenosis. Vertebral arteries: Both subclavian arteries show atherosclerotic change but no flow limiting stenosis. There is atherosclerotic plaque at the right vertebral artery origin with stenosis estimated at 30-50%. The left vertebral artery origin is widely patent. Both vertebral arteries are widely patent beyond that through the cervical region to the foramen magnum. Skeleton: Ordinary cervical spondylosis. Other neck: No mass or lymphadenopathy. Upper chest: Apical pleural and parenchymal scarring. No active process. Review of the MIP images confirms the above findings CTA HEAD FINDINGS Anterior circulation: Both internal carotid arteries are patent through the skull base and siphon regions. There is extensive calcification in the carotid siphon regions but no stenosis more than 30% suspected. The anterior and middle cerebral vessels are patent without proximal  stenosis, aneurysm or vascular malformation. No large or medium vessel occlusion identified.  Posterior circulation: Both vertebral arteries are patent to the basilar. No basilar stenosis. Posterior circulation branch vessels are patent. Venous sinuses: Patent and normal. Anatomic variants: None significant. Delayed phase: No abnormal enhancement. Review of the MIP images confirms the above findings CT Brain Perfusion Findings: CBF (<30%) Volume: 0mL Perfusion (Tmax>6.0s) volume: 0mL Mismatch Volume: 0mL Infarction Location:None IMPRESSION: Negative perfusion study. Atherosclerotic disease at both carotid bifurcations. No stenosis on the right. 20% ICA stenosis on the left. Atherosclerotic calcification in both carotid siphon regions but without stenosis greater than 30%. No intracranial large or medium vessel occlusion or correctable proximal stenosis. 30-50% stenosis at the right vertebral artery origin. These results were communicated to at 4:01 pmon 16-Nov-2019by text page via the [facility] messaging system. Electronically Signed   By: Paulina Fusi M.D.   On: 2018-08-06 16:02   Ct Cerebral Perfusion W Contrast  Result Date: August 06, 2018 CLINICAL DATA:  Acute stroke presentation. Altered level of consciousness. EXAM: CT ANGIOGRAPHY HEAD AND NECK CT PERFUSION BRAIN TECHNIQUE: Multidetector CT imaging of the head and neck was performed using the standard protocol during bolus administration of intravenous contrast. Multiplanar CT image reconstructions and MIPs were obtained to evaluate the vascular anatomy. Carotid stenosis measurements (when applicable) are obtained utilizing NASCET criteria, using the distal internal carotid diameter as the denominator. Multiphase CT imaging of the brain was performed following IV bolus contrast injection. Subsequent parametric perfusion maps were calculated using RAPID software. CONTRAST:  ISOVUE-370 IOPAMIDOL (ISOVUE-370) INJECTION 76% COMPARISON:  Head CT earlier same day  FINDINGS: CTA NECK FINDINGS Aortic arch: Aortic atherosclerosis. No aneurysm or dissection. Branching pattern of the brachiocephalic vessels from the arch is normal. Right carotid system: Common carotid artery widely patent to the bifurcation region. There is soft and calcified plaque at the carotid bifurcation. Minimal diameter of the ICA bulb is 5 mm, this same as the more distal cervical ICA. Therefore no stenosis. Left carotid system: Common carotid artery widely patent to the bifurcation region. There is soft and calcified plaque at the carotid bifurcation and ICA bulb. Minimal diameter of the proximal ICA is 4 mm. Compared to a more distal cervical ICA diameter of 5 mm, this indicates a 20% stenosis. Vertebral arteries: Both subclavian arteries show atherosclerotic change but no flow limiting stenosis. There is atherosclerotic plaque at the right vertebral artery origin with stenosis estimated at 30-50%. The left vertebral artery origin is widely patent. Both vertebral arteries are widely patent beyond that through the cervical region to the foramen magnum. Skeleton: Ordinary cervical spondylosis. Other neck: No mass or lymphadenopathy. Upper chest: Apical pleural and parenchymal scarring. No active process. Review of the MIP images confirms the above findings CTA HEAD FINDINGS Anterior circulation: Both internal carotid arteries are patent through the skull base and siphon regions. There is extensive calcification in the carotid siphon regions but no stenosis more than 30% suspected. The anterior and middle cerebral vessels are patent without proximal stenosis, aneurysm or vascular malformation. No large or medium vessel occlusion identified. Posterior circulation: Both vertebral arteries are patent to the basilar. No basilar stenosis. Posterior circulation branch vessels are patent. Venous sinuses: Patent and normal. Anatomic variants: None significant. Delayed phase: No abnormal enhancement. Review of the  MIP images confirms the above findings CT Brain Perfusion Findings: CBF (<30%) Volume: 0mL Perfusion (Tmax>6.0s) volume: 0mL Mismatch Volume: 0mL Infarction Location:None IMPRESSION: Negative perfusion study. Atherosclerotic disease at both carotid bifurcations. No stenosis on the right. 20% ICA stenosis on the left. Atherosclerotic calcification in both  carotid siphon regions but without stenosis greater than 30%. No intracranial large or medium vessel occlusion or correctable proximal stenosis. 30-50% stenosis at the right vertebral artery origin. These results were communicated to at 4:01 pmon 2019/11/16by text page via the [facility] messaging system. Electronically Signed   By: Paulina Fusi M.D.   On: 2018-08-06 16:02   Ct Head Code Stroke Wo Contrast  Result Date: 2018/08/06 CLINICAL DATA:  Code stroke.  Altered level of consciousness. EXAM: CT HEAD WITHOUT CONTRAST TECHNIQUE: Contiguous axial images were obtained from the base of the skull through the vertex without intravenous contrast. COMPARISON:  None. FINDINGS: Brain: No evidence of acute infarction, hemorrhage, hydrocephalus, extra-axial collection or mass lesion/mass effect. Generalized atrophy, mild to moderate for age. Vascular: Atherosclerotic calcification.  No hyperdense vessel. Skull: No acute finding Sinuses/Orbits: Negative Other: Motion artifact to a degree that findings could easily be obscured. These results were communicated to Dr. Wilford Corner at 3:16 pmon 11/16/19by text page via the [facility] messaging system. ASPECTS [facility] Stroke Program Early CT Score) Unable to be accurately graded due to extent of motion artifact. IMPRESSION: Significantly motion degraded study without acute finding. Electronically Signed   By: Marnee Spring M.D.   On: Aug 06, 2018 15:17    EKG EKG Interpretation  Date/Time:  Monday 08-06-2018 14:48:07 EST Ventricular Rate:  79 PR Interval:    QRS Duration: 76 QT Interval:  392 QTC Calculation: 450 R  Axis:   96 Text Interpretation:  Sinus rhythm Atrial premature complexes Right axis deviation Confirmed by Cathren Laine ([id number]) on 08/06/18 2:52:26 PM   Radiology Ct Angio Head W Or Wo Contrast  Result Date: 06-Aug-2018 CLINICAL DATA:  Acute stroke presentation. Altered level of consciousness. EXAM: CT ANGIOGRAPHY HEAD AND NECK CT PERFUSION BRAIN TECHNIQUE: Multidetector CT imaging of the head and neck was performed using the standard protocol during bolus administration of intravenous contrast. Multiplanar CT image reconstructions and MIPs were obtained to evaluate the vascular anatomy. Carotid stenosis measurements (when applicable) are obtained utilizing NASCET criteria, using the distal internal carotid diameter as the denominator. Multiphase CT imaging of the brain was performed following IV bolus contrast injection. Subsequent parametric perfusion maps were calculated using RAPID software. CONTRAST:  ISOVUE-370 IOPAMIDOL (ISOVUE-370) INJECTION 76% COMPARISON:  Head CT earlier same day FINDINGS: CTA NECK FINDINGS Aortic arch: Aortic atherosclerosis. No aneurysm or dissection. Branching pattern of the brachiocephalic vessels from the arch is normal. Right carotid system: Common carotid artery widely patent to the bifurcation region. There is soft and calcified plaque at the carotid bifurcation. Minimal diameter of the ICA bulb is 5 mm, this same as the more distal cervical ICA. Therefore no stenosis. Left carotid system: Common carotid artery widely patent to the bifurcation region. There is soft and calcified plaque at the carotid bifurcation and ICA bulb. Minimal diameter of the proximal ICA is 4 mm. Compared to a more distal cervical ICA diameter of 5 mm, this indicates a 20% stenosis. Vertebral arteries: Both subclavian arteries show atherosclerotic change but no flow limiting stenosis. There is atherosclerotic plaque at the right vertebral artery origin with stenosis estimated at 30-50%. The  left vertebral artery origin is widely patent. Both vertebral arteries are widely patent beyond that through the cervical region to the foramen magnum. Skeleton: Ordinary cervical spondylosis. Other neck: No mass or lymphadenopathy. Upper chest: Apical pleural and parenchymal scarring. No active process. Review of the MIP images confirms the above findings CTA HEAD FINDINGS Anterior circulation: Both internal carotid arteries  are patent through the skull base and siphon regions. There is extensive calcification in the carotid siphon regions but no stenosis more than 30% suspected. The anterior and middle cerebral vessels are patent without proximal stenosis, aneurysm or vascular malformation. No large or medium vessel occlusion identified. Posterior circulation: Both vertebral arteries are patent to the basilar. No basilar stenosis. Posterior circulation branch vessels are patent. Venous sinuses: Patent and normal. Anatomic variants: None significant. Delayed phase: No abnormal enhancement. Review of the MIP images confirms the above findings CT Brain Perfusion Findings: CBF (<30%) Volume: 0mL Perfusion (Tmax>6.0s) volume: 0mL Mismatch Volume: 0mL Infarction Location:None IMPRESSION: Negative perfusion study. Atherosclerotic disease at both carotid bifurcations. No stenosis on the right. 20% ICA stenosis on the left. Atherosclerotic calcification in both carotid siphon regions but without stenosis greater than 30%. No intracranial large or medium vessel occlusion or correctable proximal stenosis. 30-50% stenosis at the right vertebral artery origin. These results were communicated to at 4:01 pmon 11/16/2019by text page via the [facility] messaging system. Electronically Signed   By: Paulina Fusi M.D.   On: 08/20/2018 16:02   Ct Angio Neck W Or Wo Contrast  Result Date: 07/24/2018 CLINICAL DATA:  Acute stroke presentation. Altered level of consciousness. EXAM: CT ANGIOGRAPHY HEAD AND NECK CT PERFUSION BRAIN  TECHNIQUE: Multidetector CT imaging of the head and neck was performed using the standard protocol during bolus administration of intravenous contrast. Multiplanar CT image reconstructions and MIPs were obtained to evaluate the vascular anatomy. Carotid stenosis measurements (when applicable) are obtained utilizing NASCET criteria, using the distal internal carotid diameter as the denominator. Multiphase CT imaging of the brain was performed following IV bolus contrast injection. Subsequent parametric perfusion maps were calculated using RAPID software. CONTRAST:  ISOVUE-370 IOPAMIDOL (ISOVUE-370) INJECTION 76% COMPARISON:  Head CT earlier same day FINDINGS: CTA NECK FINDINGS Aortic arch: Aortic atherosclerosis. No aneurysm or dissection. Branching pattern of the brachiocephalic vessels from the arch is normal. Right carotid system: Common carotid artery widely patent to the bifurcation region. There is soft and calcified plaque at the carotid bifurcation. Minimal diameter of the ICA bulb is 5 mm, this same as the more distal cervical ICA. Therefore no stenosis. Left carotid system: Common carotid artery widely patent to the bifurcation region. There is soft and calcified plaque at the carotid bifurcation and ICA bulb. Minimal diameter of the proximal ICA is 4 mm. Compared to a more distal cervical ICA diameter of 5 mm, this indicates a 20% stenosis. Vertebral arteries: Both subclavian arteries show atherosclerotic change but no flow limiting stenosis. There is atherosclerotic plaque at the right vertebral artery origin with stenosis estimated at 30-50%. The left vertebral artery origin is widely patent. Both vertebral arteries are widely patent beyond that through the cervical region to the foramen magnum. Skeleton: Ordinary cervical spondylosis. Other neck: No mass or lymphadenopathy. Upper chest: Apical pleural and parenchymal scarring. No active process. Review of the MIP images confirms the above findings  CTA HEAD FINDINGS Anterior circulation: Both internal carotid arteries are patent through the skull base and siphon regions. There is extensive calcification in the carotid siphon regions but no stenosis more than 30% suspected. The anterior and middle cerebral vessels are patent without proximal stenosis, aneurysm or vascular malformation. No large or medium vessel occlusion identified. Posterior circulation: Both vertebral arteries are patent to the basilar. No basilar stenosis. Posterior circulation branch vessels are patent. Venous sinuses: Patent and normal. Anatomic variants: None significant. Delayed phase: No abnormal enhancement. Review of  the MIP images confirms the above findings CT Brain Perfusion Findings: CBF (<30%) Volume: 0mL Perfusion (Tmax>6.0s) volume: 0mL Mismatch Volume: 0mL Infarction Location:None IMPRESSION: Negative perfusion study. Atherosclerotic disease at both carotid bifurcations. No stenosis on the right. 20% ICA stenosis on the left. Atherosclerotic calcification in both carotid siphon regions but without stenosis greater than 30%. No intracranial large or medium vessel occlusion or correctable proximal stenosis. 30-50% stenosis at the right vertebral artery origin. These results were communicated to at 4:01 pmon 11-15-19by text page via the [facility] messaging system. Electronically Signed   By: Paulina Fusi M.D.   On: 2018/08/05 16:02   Ct Cerebral Perfusion W Contrast  Result Date: 05-Aug-2018 CLINICAL DATA:  Acute stroke presentation. Altered level of consciousness. EXAM: CT ANGIOGRAPHY HEAD AND NECK CT PERFUSION BRAIN TECHNIQUE: Multidetector CT imaging of the head and neck was performed using the standard protocol during bolus administration of intravenous contrast. Multiplanar CT image reconstructions and MIPs were obtained to evaluate the vascular anatomy. Carotid stenosis measurements (when applicable) are obtained utilizing NASCET criteria, using the distal internal  carotid diameter as the denominator. Multiphase CT imaging of the brain was performed following IV bolus contrast injection. Subsequent parametric perfusion maps were calculated using RAPID software. CONTRAST:  ISOVUE-370 IOPAMIDOL (ISOVUE-370) INJECTION 76% COMPARISON:  Head CT earlier same day FINDINGS: CTA NECK FINDINGS Aortic arch: Aortic atherosclerosis. No aneurysm or dissection. Branching pattern of the brachiocephalic vessels from the arch is normal. Right carotid system: Common carotid artery widely patent to the bifurcation region. There is soft and calcified plaque at the carotid bifurcation. Minimal diameter of the ICA bulb is 5 mm, this same as the more distal cervical ICA. Therefore no stenosis. Left carotid system: Common carotid artery widely patent to the bifurcation region. There is soft and calcified plaque at the carotid bifurcation and ICA bulb. Minimal diameter of the proximal ICA is 4 mm. Compared to a more distal cervical ICA diameter of 5 mm, this indicates a 20% stenosis. Vertebral arteries: Both subclavian arteries show atherosclerotic change but no flow limiting stenosis. There is atherosclerotic plaque at the right vertebral artery origin with stenosis estimated at 30-50%. The left vertebral artery origin is widely patent. Both vertebral arteries are widely patent beyond that through the cervical region to the foramen magnum. Skeleton: Ordinary cervical spondylosis. Other neck: No mass or lymphadenopathy. Upper chest: Apical pleural and parenchymal scarring. No active process. Review of the MIP images confirms the above findings CTA HEAD FINDINGS Anterior circulation: Both internal carotid arteries are patent through the skull base and siphon regions. There is extensive calcification in the carotid siphon regions but no stenosis more than 30% suspected. The anterior and middle cerebral vessels are patent without proximal stenosis, aneurysm or vascular malformation. No large or  medium vessel occlusion identified. Posterior circulation: Both vertebral arteries are patent to the basilar. No basilar stenosis. Posterior circulation branch vessels are patent. Venous sinuses: Patent and normal. Anatomic variants: None significant. Delayed phase: No abnormal enhancement. Review of the MIP images confirms the above findings CT Brain Perfusion Findings: CBF (<30%) Volume: 0mL Perfusion (Tmax>6.0s) volume: 0mL Mismatch Volume: 0mL Infarction Location:None IMPRESSION: Negative perfusion study. Atherosclerotic disease at both carotid bifurcations. No stenosis on the right. 20% ICA stenosis on the left. Atherosclerotic calcification in both carotid siphon regions but without stenosis greater than 30%. No intracranial large or medium vessel occlusion or correctable proximal stenosis. 30-50% stenosis at the right vertebral artery origin. These results were communicated to at 4:01  pmon 12/06/2019by text page via the [facility] messaging system. Electronically Signed   By: Paulina Fusi M.D.   On: 08-26-18 16:02   Ct Head Code Stroke Wo Contrast  Result Date: August 26, 2018 CLINICAL DATA:  Code stroke.  Altered level of consciousness. EXAM: CT HEAD WITHOUT CONTRAST TECHNIQUE: Contiguous axial images were obtained from the base of the skull through the vertex without intravenous contrast. COMPARISON:  None. FINDINGS: Brain: No evidence of acute infarction, hemorrhage, hydrocephalus, extra-axial collection or mass lesion/mass effect. Generalized atrophy, mild to moderate for age. Vascular: Atherosclerotic calcification.  No hyperdense vessel. Skull: No acute finding Sinuses/Orbits: Negative Other: Motion artifact to a degree that findings could easily be obscured. These results were communicated to Dr. Wilford Corner at 3:16 pmon 06-Dec-2019by text page via the [facility] messaging system. ASPECTS [facility] Stroke Program Early CT Score) Unable to be accurately graded due to extent of motion artifact. IMPRESSION: Significantly  motion degraded study without acute finding. Electronically Signed   By: Marnee Spring M.D.   On: 08-26-18 15:17    Procedures Procedures (including critical care time)  Medications Ordered in ED Medications  0.9 %  sodium chloride infusion (has no administration in time range)     Initial Impression / Assessment and Plan / ED Course  I have reviewed the triage vital signs and the nursing notes.  Pertinent labs & imaging results that were available during my care of the patient were reviewed by me and considered in my medical decision making (see chart for details).  Code stroke activated.  Iv ns. Stat ct. Continuous pulse ox and monitor. Labs and ua.   Reviewed nursing notes and prior charts for additional history.   Labs reviewed - k sl low.   Ct reviewed - no acute cva noted on ct.   Neurology has evaluated - pt is receiving iv tpa re acute aphasia, expressive aphasia.   Neurology indicates they are admitted pt to neuro icu.  Recheck pt, no acute change on neuro exam from initial.  UA remains pending.   CRITICAL CARE  RE acute aphasia, code stroke activation, emergent iv tpa administration. Performed by: Suzi Roots Total critical care time: 40 minutes Critical care time was exclusive of separately billable procedures and treating other patients. Critical care was necessary to treat or prevent imminent or life-threatening deterioration. Critical care was time spent personally by me on the following activities: development of treatment plan with patient and/or surrogate as well as nursing, discussions with consultants, evaluation of patient's response to treatment, examination of patient, obtaining history from patient or surrogate, ordering and performing treatments and interventions, ordering and review of laboratory studies, ordering and review of radiographic studies, pulse oximetry and re-evaluation of patient's condition.   Final Clinical Impressions(s) / ED  Diagnoses   Final diagnoses:  None    ED Discharge Orders    None       Cathren Laine, MD 08/26/2018 [phone]

## 2018-08-02 ENCOUNTER — Inpatient Hospital Stay (HOSPITAL_COMMUNITY): Payer: Medicare HMO

## 2018-08-02 ENCOUNTER — Inpatient Hospital Stay: Payer: Self-pay

## 2018-08-02 DIAGNOSIS — R509 Fever, unspecified: Secondary | ICD-10-CM

## 2018-08-02 DIAGNOSIS — R4182 Altered mental status, unspecified: Secondary | ICD-10-CM

## 2018-08-02 DIAGNOSIS — I6300 Cerebral infarction due to thrombosis of unspecified precerebral artery: Secondary | ICD-10-CM

## 2018-08-02 LAB — LIPID PANEL
CHOL/HDL RATIO: 2.5 ratio
Cholesterol: 157 mg/dL (ref 0–200)
HDL: 64 mg/dL (ref >40)
LDL Cholesterol: 76 mg/dL (ref 0–99)
Triglycerides: 83 mg/dL (ref <150)
VLDL: 17 mg/dL (ref 0–40)

## 2018-08-02 LAB — HEMOGLOBIN A1C
Hgb A1c MFr Bld: 5.6 % (ref 4.8–5.6)
MEAN PLASMA GLUCOSE: 114.02 mg/dL

## 2018-08-02 MED ORDER — KETAMINE HCL-SODIUM CHLORIDE 100-0.9 MG/10ML-% IV SOSY: 0.5000 mg/kg | PREFILLED_SYRINGE | Freq: Once | INTRAVENOUS | Status: DC

## 2018-08-02 MED ORDER — KETAMINE HCL-SODIUM CHLORIDE 100-0.9 MG/10ML-% IV SOSY
0.5000 mg/kg | PREFILLED_SYRINGE | Freq: Once | INTRAVENOUS | Status: DC
  Filled 2018-08-02 (×3): qty 10

## 2018-08-02 MED ORDER — LORAZEPAM 2 MG/ML IJ SOLN
1.0000 mg | Freq: Once | INTRAMUSCULAR | Status: AC
  Administered 2018-08-02: 1 mg via INTRAVENOUS
  Filled 2018-08-02: qty 1

## 2018-08-02 MED ORDER — KETAMINE HCL 50 MG/5ML IJ SOSY
0.5000 mg/kg | PREFILLED_SYRINGE | Freq: Once | INTRAMUSCULAR | Status: AC
  Administered 2018-08-02: 26 mg via INTRAVENOUS
  Filled 2018-08-02: qty 2.6

## 2018-08-02 MED ORDER — SODIUM CHLORIDE 0.9 % IV SOLN: INTRAVENOUS | Status: DC | PRN

## 2018-08-02 MED ORDER — PHENYLEPHRINE HCL-NACL 40-0.9 MG/250ML-% IV SOLN
0.0000 ug/min | INTRAVENOUS | Status: DC
  Administered 2018-08-02: 200 ug/min via INTRAVENOUS
  Administered 2018-08-02 – 2018-08-03 (×4): 400 ug/min via INTRAVENOUS
  Administered 2018-08-03 (×2): 350 ug/min via INTRAVENOUS
  Administered 2018-08-03: 375 ug/min via INTRAVENOUS
  Administered 2018-08-03 (×2): 400 ug/min via INTRAVENOUS
  Filled 2018-08-02 (×12): qty 250

## 2018-08-02 MED ORDER — SODIUM CHLORIDE 0.9 % IV BOLUS
1000.0000 mL | Freq: Once | INTRAVENOUS | Status: AC
  Administered 2018-08-02: 1000 mL via INTRAVENOUS

## 2018-08-02 MED ORDER — GADOBUTROL 1 MMOL/ML IV SOLN
5.0000 mL | Freq: Once | INTRAVENOUS | Status: AC | PRN
  Administered 2018-08-02: 5 mL via INTRAVENOUS

## 2018-08-02 MED ORDER — ORAL CARE MOUTH RINSE
15.0000 mL | Freq: Two times a day (BID) | OROMUCOSAL | Status: DC
  Administered 2018-08-02 – 2018-08-03 (×2): 15 mL via OROMUCOSAL

## 2018-08-02 MED ORDER — POTASSIUM CHLORIDE 10 MEQ/100ML IV SOLN
10.0000 meq | INTRAVENOUS | Status: AC
  Administered 2018-08-02 (×4): 10 meq via INTRAVENOUS
  Filled 2018-08-02: qty 100

## 2018-08-02 MED ORDER — CHLORHEXIDINE GLUCONATE CLOTH 2 % EX PADS
6.0000 | MEDICATED_PAD | Freq: Every day | CUTANEOUS | Status: DC
  Administered 2018-08-02: 6 via TOPICAL

## 2018-08-02 MED ORDER — DEXMEDETOMIDINE HCL IN NACL 200 MCG/50ML IV SOLN
0.4000 ug/kg/h | INTRAVENOUS | Status: DC
  Administered 2018-08-02: 0.4 ug/kg/h via INTRAVENOUS
  Administered 2018-08-02: 1.2 ug/kg/h via INTRAVENOUS
  Administered 2018-08-03: 0.4 ug/kg/h via INTRAVENOUS
  Filled 2018-08-02: qty 100
  Filled 2018-08-02 (×2): qty 50
  Filled 2018-08-02: qty 100

## 2018-08-02 MED ORDER — SODIUM CHLORIDE 0.9% FLUSH
10.0000 mL | INTRAVENOUS | Status: DC | PRN
  Administered 2018-08-03: 10 mL
  Filled 2018-08-02: qty 40

## 2018-08-02 MED ORDER — PHENYLEPHRINE HCL-NACL 10-0.9 MG/250ML-% IV SOLN
0.0000 ug/min | INTRAVENOUS | Status: DC
  Administered 2018-08-02: 300 ug/min via INTRAVENOUS
  Administered 2018-08-02: 150 ug/min via INTRAVENOUS
  Administered 2018-08-02: 300 ug/min via INTRAVENOUS
  Administered 2018-08-02 (×2): 325 ug/min via INTRAVENOUS
  Administered 2018-08-02: 225 ug/min via INTRAVENOUS
  Administered 2018-08-02: 20 ug/min via INTRAVENOUS
  Filled 2018-08-02: qty 250
  Filled 2018-08-02: qty 500
  Filled 2018-08-02: qty 750
  Filled 2018-08-02: qty 500

## 2018-08-02 MED ORDER — SODIUM CHLORIDE 0.9% FLUSH
10.0000 mL | Freq: Two times a day (BID) | INTRAVENOUS | Status: DC
  Administered 2018-08-02 – 2018-08-07 (×8): 10 mL

## 2018-08-02 MED ORDER — NOREPINEPHRINE 4 MG/250ML-% IV SOLN
0.0000 ug/min | INTRAVENOUS | Status: DC
  Administered 2018-08-02: 2 ug/min via INTRAVENOUS
  Administered 2018-08-03: 12 ug/min via INTRAVENOUS
  Administered 2018-08-03: 14 ug/min via INTRAVENOUS
  Filled 2018-08-02 (×3): qty 250

## 2018-08-02 MED ORDER — SODIUM CHLORIDE 0.9 % IV BOLUS
500.0000 mL | Freq: Once | INTRAVENOUS | Status: AC
  Administered 2018-08-02: 500 mL via INTRAVENOUS

## 2018-08-02 MED ORDER — ENOXAPARIN SODIUM 40 MG/0.4ML ~~LOC~~ SOLN
40.0000 mg | SUBCUTANEOUS | Status: DC
  Administered 2018-08-03 – 2018-08-06 (×4): 40 mg via SUBCUTANEOUS
  Filled 2018-08-02 (×4): qty 0.4

## 2018-08-02 MED ORDER — CALCIUM GLUCONATE-NACL 1-0.675 GM/50ML-% IV SOLN
1.0000 g | Freq: Once | INTRAVENOUS | Status: AC
  Administered 2018-08-02: 1000 mg via INTRAVENOUS
  Filled 2018-08-02: qty 50

## 2018-08-02 MED ORDER — ENOXAPARIN SODIUM 40 MG/0.4ML ~~LOC~~ SOLN: 40.0000 mg | SUBCUTANEOUS | Status: DC

## 2018-08-02 NOTE — Progress Notes (Signed)
Per Dr. Denese KillingsAgarwala, new SBP parameters 110-150

## 2018-08-02 NOTE — Progress Notes (Signed)
Dr. Pearlean BrownieSethi notified of SBP in the 70s via art line at 0830. Orders received for a 1L bolus of NS. SBP has improved to the 100s however is still below the 120-140 parameter, MD aware & will consult CCM for additional interventions. Bolus still infusing.

## 2018-08-02 NOTE — Progress Notes (Signed)
arteril line placement unsuccessful. No hematoma noted. Patient is wiggling everywhere all over the bed.

## 2018-08-02 NOTE — Progress Notes (Signed)
PT Cancellation Note  Patient Details Name: Martyn Ehrichnn C Zachar MRN: [id number] DOB: 21-Jul-1932   Cancelled Treatment:    Reason Eval/Treat Not Completed: Active bedrest order   Enedina FinnerMaija B Marquita Lias 08/02/2018, 7:20 AM  Delaney MeigsMaija Tabor Kerrilyn Azbill, PT Acute Rehabilitation Services Pager: [phone] Office: [phone]

## 2018-08-02 NOTE — Procedures (Signed)
ELECTROENCEPHALOGRAM REPORT   Patient: Victoria Joseph       Room #: 4N32C EEG No. ID: [id number] Age: 84 y.o.        Sex: female Referring Physician: Pearlean Brownie Report Date:  08/02/2018        Interpreting Physician: Thana Farr  History: Victoria Joseph is an 84 y.o. female with altered mental status  Medications:  Elavil, Lipitor, Oscal, Ganciclovir, Merrem, Protonix, Vancomycin, Precedex, Neosynephrine  Conditions of Recording:  This is a 21 channel routine scalp EEG performed with bipolar and monopolar montages arranged in accordance to the international 10/20 system of electrode placement. One channel was dedicated to EKG recording.  The patient is in the sedated state.  Description:  Artifact is prominent during the recording often obscuring the background rhythm. When able to be visualized the background is slow and poorly organized.   It consists of a low voltage, polymorphic delta rhythm that is diffusely distributed and continuous.  What appears to be symmetrical sleep spindles are also noted at times. Hyperventilation and intermittent photic stimulation were not performed.   IMPRESSION: This EEG is characterized by slowing which is consistent with normal drowse.  Can not rule out the possibility of slowing related to general cerebral disturbance such as a metabolic encephalopathy.  Clinical correlation recommended.  No epileptiform activity is noted.       Thana Farr, MD Neurology [phone] 08/02/2018, 1:47 PM

## 2018-08-02 NOTE — Progress Notes (Signed)
Dr. Denese Killings notified of pt's BP still being below ordered parameters, and dropping to the 70s at times. Order received for 500 mL NS bolus and MD will come evaluate.   Also informed MD of tremor in lower extremities.

## 2018-08-02 NOTE — Consult Note (Addendum)
NAME:  Victoria Joseph, MRN:  [id number], DOB:  1932/04/23, LOS: 1 ADMISSION DATE:  08/08/2018, CONSULTATION DATE:  08/02/2018 REFERRING MD:  Lina SayreSethi, P - Neurology, CHIEF COMPLAINT:  08/02/2018   Brief History   84 year old woman admitted for altered mental status. Became febrile overnight and hypotensive this morning. PCCM has been consulted for possible sepsis and management of hypotension.  History of present illness   Admitted from skilled nursing facility with sudden change in mental status with agitation. Had reported feeling non-specifically unwell that morning.  Initially triaged as possible stroke and received TPA.  Became febrile and apparently had nuchal rigidity and was started on meningitis treatment.  She has been agitated and had received lorazepam prior to fever, but no haloperidol.  Past Medical History   Past Medical History:  Diagnosis Date  . Anxiety   . Arrhythmia    PVCs  . Diverticulosis   . GERD (gastroesophageal reflux disease)   . Heart murmur   . Hiatal hernia   . Hyperlipidemia   . Hypertension   . IBS (irritable bowel syndrome)   . Osteoarthritis   . Osteoporosis   . Pneumonia 2009  . PVD (peripheral vascular disease) (HCC)   . Rhinitis, allergic   . Sinusitis   . URI (upper respiratory infection)    Past Surgical History:  Procedure Laterality Date  . APPENDECTOMY    . CHOLECYSTECTOMY    . KNEE SURGERY     right repair after fall  . OOPHORECTOMY     still have 1 ovary   Significant Hospital Events   N/A  Consults:  PCCM 11/12  Procedures:  N/A  Significant Diagnostic Tests:  CT head 11/11 showed:   Micro Data:  Blood cultures 11/12 Urine cultures 11/12  Antimicrobials:  Vancomycin 11/12 Meropenen  11/12  Interim history/subjective:  Remains confused and alternates between somnolence and agitation.  Objective   Blood pressure (!) 91/43, pulse 91, temperature (!) 100.4 F (38 C), temperature source Axillary, resp. rate (!)  23, height 4\' 11"  (1.499 m), weight 51.3 kg, SpO2 95 %.        Intake/Output Summary (Last 24 hours) at 08/02/2018 0957 Last data filed at 08/02/2018 0800 Gross per 24 hour  Intake 1297.48 ml  Output 1350 ml  Net -52.52 ml   Filed Weights   08/10/2018 1442  Weight: 51.3 kg    Examination: General: Frail but well nourished elderly woman HENT: Dry mucous membranes. No lymphadenopathy. Normal fundi. Lungs: Kyphosis. Chest clear with no respiratory distress Cardiovascular: Extremities warm and well perfused, no JVD, no edema. HS are normal. Abdomen: soft and non-tender. Extremities: No rashes or swollen joints. Neuro: Eyes closed. PERL. No facial asymmetry, mumbles incomprehensibly. Generalized rigidity with tremor which dissipates with passive movement. No focal limb weakness. GU: Purewick in place  Resolved Hospital Problem list   N/A  Assessment & Plan:  Critically ill due to hypotension of unclear cause, sepsis remains a possibility.  Some improvement with fluid administration (has received approx 6830ml/kg) Altered mental status of unclear etiology, stroke syndrome vs metabolic encephalopathy secondary to infection vs post-ictal/NCSE.  Plan: Fluid challenge to total of 6730ml/kg and continue iv maintenance fluids. Titrate Phenylephrine to keep SBP 120-140 - may have perfusion dependent examination. Obtain MRI and LP to complete workup  Ketamine for procedural sedation to avoid hypotension.  Best practice:  Diet: NPO  Pain/Anxiety/Delirium protocol (if indicated): limit sedation  VAP protocol (if indicated): N/A DVT prophylaxis: lovenox [place] starting  tomorrow GI prophylaxis: N/A Glucose control: monitor for now. Mobility: bedrest with restrains to prevent self harm. Code Status: Full code. Family Communication: Children updated at bedside. Disposition: ICU  Labs   CBC: Recent Labs  Lab 08/08/2018 1446 07/31/2018 1501  WBC 11.4*  --   NEUTROABS 8.2*  --   HGB 12.4 13.3    HCT 40.9 39.0  MCV 88.1  --   PLT 367  --     Basic Metabolic Panel: Recent Labs  Lab 07/31/2018 1446 08/18/2018 1501  NA 134* 136  K 3.2* 3.3*  CL 102 104  CO2 23  --   GLUCOSE 118* 117*  BUN 13 15  CREATININE 0.60 0.50  CALCIUM 9.0  --    GFR: Estimated Creatinine Clearance: 35.7 mL/min (by C-G formula based on SCr of 0.5 mg/dL). Recent Labs  Lab 07/27/2018 1446  WBC 11.4*    Liver Function Tests: Recent Labs  Lab 07/28/2018 1446  AST 25  ALT 21  ALKPHOS 76  BILITOT 0.6  PROT 6.6  ALBUMIN 3.6   No results for input(s): LIPASE, AMYLASE in the last 168 hours. No results for input(s): AMMONIA in the last 168 hours.  ABG    Component Value Date/Time   TCO2 23 07/25/2018 1501     Coagulation Profile: Recent Labs  Lab 08/12/2018 1446  INR 1.02    Cardiac Enzymes: No results for input(s): CKTOTAL, CKMB, CKMBINDEX, TROPONINI in the last 168 hours.  HbA1C: Hgb A1c MFr Bld  Date/Time Value Ref Range Status  08/02/2018 04:38 AM 5.6 4.8 - 5.6 % Final    Comment:    (NOTE) Pre diabetes:          5.7%-6.4% Diabetes:              >6.4% Glycemic control for   <7.0% adults with diabetes     CBG: Recent Labs  Lab 08/03/2018 1452  GLUCAP 110*    Review of Systems:   A complete review of systems was performed and pertinent elements are listed here. All other systems were negative.  Patient is independent for ADLs. Had been in usual state of health prior to admission.  She has frequent diarrhea from IBS  Past Medical History  She,  has a past medical history of Anxiety, Arrhythmia, Diverticulosis, GERD (gastroesophageal reflux disease), Heart murmur, Hiatal hernia, Hyperlipidemia, Hypertension, IBS (irritable bowel syndrome), Osteoarthritis, Osteoporosis, Pneumonia (2009), PVD (peripheral vascular disease) (HCC), Rhinitis, allergic, Sinusitis, and URI (upper respiratory infection).   Surgical History    Past Surgical History:  Procedure Laterality Date  .  APPENDECTOMY    . CHOLECYSTECTOMY    . KNEE SURGERY     right repair after fall  . OOPHORECTOMY     still have 1 ovary     Social History   reports that she has never smoked. She has never used smokeless tobacco. She reports that she does not drink alcohol or use drugs.   Family History   Her family history includes Asthma in her paternal grandfather; Heart failure in her mother. There is no history of Colon cancer, Heart attack, Stroke, or Hypertension.   Allergies Allergies  Allergen Reactions  . Penicillins Hives, Other (See Comments) and Rash    Has patient had a PCN reaction causing immediate rash, facial/tongue/throat swelling, SOB or lightheadedness with hypotension: No Has patient had a PCN reaction causing severe rash involving mucus membranes or skin necrosis: No Has patient had a PCN reaction that required hospitalization  No Has patient had a PCN reaction occurring within the last 10 years: No If all of the above answers are "NO", then may proceed with Cephalosporin use.  . Sulfa Antibiotics     nausea     Home Medications  Prior to Admission medications   Medication Sig Start Date End Date Taking? Authorizing Provider  colestipol (COLESTID) 1 g tablet Take 1 tablet (1 g total) by mouth 2 (two) times daily. 10/25/17  Yes Nandigam, Eleonore Chiquito, MD  ESTRACE VAGINAL 0.1 MG/GM vaginal cream Place 1 Applicatorful vaginally as needed (vaginal irritation).    Yes [provider]  ferrous sulfate 325 (65 FE) MG tablet Take 325 mg by mouth 2 (two) times daily. 01/10/18  Yes [provider]  fexofenadine (ALLEGRA) 180 MG tablet Take 180 mg by mouth daily.   Yes [provider]  fluticasone (FLONASE) 50 MCG/ACT nasal spray Place 2 sprays into both nostrils daily as needed for rhinitis.    Yes [provider]  amitriptyline (ELAVIL) 25 MG tablet Take 25 mg by mouth at bedtime.     [provider]  aspirin EC 81 MG tablet Take 81 mg by mouth  daily.    [provider]  atenolol (TENORMIN) 50 MG tablet Take 1 tablet (50 mg total) by mouth 2 (two) times daily. 01/18/18   Corky Crafts, MD  atorvastatin (LIPITOR) 20 MG tablet Take 1 tablet (20 mg total) by mouth daily. 01/18/18   Corky Crafts, MD  Calcium Carbonate-Vitamin D (CALCIUM 600+D) 600-400 MG-UNIT tablet Take 1 tablet by mouth 2 (two) times daily.    [provider]  chlorthalidone (HYGROTON) 25 MG tablet Take 0.5 tablets (12.5 mg total) by mouth daily. 03/07/18   Corky Crafts, MD  cholecalciferol (VITAMIN D) 1000 units tablet Take 1,000 Units by mouth daily.    [provider]  cilostazol (PLETAL) 100 MG tablet Take 100 mg by mouth 2 (two) times daily.     [provider]  Multiple Vitamin (MULTIVITAMIN WITH MINERALS) TABS tablet Take 1 tablet by mouth daily.    [provider]  potassium chloride SA (K-DUR,KLOR-CON) 20 MEQ tablet Take 1 tablet (20 mEq total) by mouth daily. 01/18/18   Corky Crafts, MD  vitamin C (ASCORBIC ACID) 500 MG tablet Take 500 mg by mouth at bedtime.     [provider]  zoledronic acid (RECLAST) 5 MG/100ML SOLN Inject 5 mg into the vein once. Pt receives injection once per year.    [provider]  famotidine (PEPCID) 20 MG tablet Take 20 mg by mouth daily.    12/15/11  [provider]  Potassium Chloride (KLOR-CON 10 PO) Take 10 mEq by mouth daily.    12/15/11  [provider]     CRITICAL CARE Performed by: Lynnell Catalan   Total critical care time: 60 minutes  Critical care time was exclusive of separately billable procedures and treating other patients.  Critical care was necessary to treat or prevent imminent or life-threatening deterioration.  Critical care was time spent personally by me on the following activities: development of treatment plan with patient and/or surrogate as well as nursing, discussions with consultants, evaluation of  patient's response to treatment, examination of patient, obtaining history from patient or surrogate, ordering and performing treatments and interventions, ordering and review of laboratory studies, ordering and review of radiographic studies, pulse oximetry, re-evaluation of patient's condition and participation in multidisciplinary rounds.  Lynnell Catalan, MD [facility]  ICU Physician [facility] [place] [facility]  Pager: [phone] Mobile: [phone] After hours: [phone].      Patient required sedation for MRI - gave ketamine and dexmedetomidine.  Hypotension requiring pushes or phenylephrine in total. I was present during the entire trip to MRI and provided and additional 60 min of critical care.  Lynnell Catalan, MD [facility] ICU Physician [facility] [place] [facility]  Pager: [phone] Mobile: [phone] After hours: [phone].

## 2018-08-02 NOTE — Progress Notes (Signed)
pts BP's continue to be inconsistent and inaccurate making drip titration difficult. Md aware,  A line to be placed.

## 2018-08-02 NOTE — Progress Notes (Signed)
Peripherally Inserted Central Catheter/Midline Placement  The IV Nurse has discussed with the patient and/or persons authorized to consent for the patient, the purpose of this procedure and the potential benefits and risks involved with this procedure.  The benefits include less needle sticks, lab draws from the catheter, and the patient may be discharged home with the catheter. Risks include, but not limited to, infection, bleeding, blood clot (thrombus formation), and puncture of an artery; nerve damage and irregular heartbeat and possibility to perform a PICC exchange if needed/ordered by physician.  Alternatives to this procedure were also discussed.  Bard Power PICC patient education guide, fact sheet on infection prevention and patient information card has been provided to patient /or left at bedside.    PICC/Midline Placement Documentation  PICC Triple Lumen 08/02/18 PICC Right Brachial 33 cm 0 cm (Active)  Indication for Insertion or Continuance of Line Poor Vasculature-patient has had multiple peripheral attempts or PIVs lasting less than 24 hours;Vasoactive infusions 08/02/2018  6:00 PM  Exposed Catheter (cm) 1 cm 08/02/2018  6:00 PM  Site Assessment Clean;Dry;Intact 08/02/2018  6:00 PM  Lumen #1 Status Flushed;Saline locked;Blood return noted 08/02/2018  6:00 PM  Lumen #2 Status Flushed;Saline locked;Blood return noted 08/02/2018  6:00 PM  Lumen #3 Status Flushed;Saline locked;Blood return noted 08/02/2018  6:00 PM  Dressing Type Transparent 08/02/2018  6:00 PM  Dressing Status Clean;Dry;Intact;Antimicrobial disc in place 08/02/2018  6:00 PM  Dressing Change Due 08/09/18 08/02/2018  6:00 PM       Ethelda ChickCurrie, Jericka Kadar Robert 08/02/2018, 6:46 PM

## 2018-08-02 NOTE — Progress Notes (Signed)
EEG completed; results pending.    

## 2018-08-02 NOTE — Progress Notes (Signed)
While in MRI scanner, BP's were not able to be obtained by either the cuff or art line from the equipment, all other VS at baseline. Dr. Denese KillingsAgarwala was present, who said it was okay to proceed without device to measure BP. Therefore no vitals are recorded from 1645-1730.

## 2018-08-02 NOTE — Progress Notes (Signed)
SLP Cancellation Note  Patient Details Name: Victoria Joseph MRN: [id number] DOB: 1932-08-07   Cancelled treatment:       Reason Eval/Treat Not Completed: Fatigue/lethargy limiting ability to participate. Discussed with RN.  She will contact SLP if changes in MS allow assessment.   Blenda MountsCouture, Heily Carlucci Laurice 08/02/2018, 10:48 AM   Jill SideAmanda L. Samson Fredericouture, MA CCC/SLP Acute Rehabilitation Services Office number [phone] Pager [phone]

## 2018-08-02 NOTE — Progress Notes (Signed)
Pt restless , unable to achieve an accurate BP reading despite multiple attemps at automatic and manual.  Victoria Joseph made aware and ok to give PRN ativan.

## 2018-08-02 NOTE — Progress Notes (Addendum)
STROKE TEAM PROGRESS NOTE   INTERVAL HISTORY Her son and daughter / dtr-in law are at the bedside.  During the night temp went up to 103, ? Etiology. Started on abx. Remains lethargic. BP low in the 70s. Code sepsis called. Fluid bolus helped.  Critical care physicians consulted.Post tPA CT scan of the head shows small volume right frontal convexity and left parietal parenchymal hemorrhages.blood pressure has been adequately controlled. Patient has been quite agitated requiring sedation and restraints.  Vitals:   08/02/18 0715 08/02/18 0730 08/02/18 0800 08/02/18 0830  BP: (!) 134/31 (!) 114/103  108/85  Pulse: (!) 109 89 88 (!) 110  Resp: 18 (!) 32 (!) 28 16  Temp:   (!) 100.4 F (38 C)   TempSrc:   Axillary   SpO2: 98% 96% 96% 98%  Weight:      Height:        CBC:  Recent Labs  Lab 07/23/2018 1446 08/03/2018 1501  WBC 11.4*  --   NEUTROABS 8.2*  --   HGB 12.4 13.3  HCT 40.9 39.0  MCV 88.1  --   PLT 367  --     Basic Metabolic Panel:  Recent Labs  Lab 07/28/2018 1446 07/24/2018 1501  NA 134* 136  K 3.2* 3.3*  CL 102 104  CO2 23  --   GLUCOSE 118* 117*  BUN 13 15  CREATININE 0.60 0.50  CALCIUM 9.0  --    Lipid Panel:     Component Value Date/Time   CHOL 157 08/02/2018 0438   TRIG 83 08/02/2018 0438   HDL 64 08/02/2018 0438   CHOLHDL 2.5 08/02/2018 0438   VLDL 17 08/02/2018 0438   LDLCALC 76 08/02/2018 0438   HgbA1c:  Lab Results  Component Value Date   HGBA1C 5.6 08/02/2018   Urine Drug Screen:     Component Value Date/Time   LABOPIA NONE DETECTED 08/04/2018 2110   COCAINSCRNUR NONE DETECTED 07/23/2018 2110   LABBENZ NONE DETECTED 07/27/2018 2110   AMPHETMU NONE DETECTED 08/13/2018 2110   THCU NONE DETECTED 08/17/2018 2110   LABBARB NONE DETECTED 08/09/2018 2110    Alcohol Level     Component Value Date/Time   ETH <10 08/14/2018 1515    IMAGING Ct Angio Head W Or Wo Contrast  Result Date: 07/31/2018 CLINICAL DATA:  Acute stroke presentation.  Altered level of consciousness. EXAM: CT ANGIOGRAPHY HEAD AND NECK CT PERFUSION BRAIN TECHNIQUE: Multidetector CT imaging of the head and neck was performed using the standard protocol during bolus administration of intravenous contrast. Multiplanar CT image reconstructions and MIPs were obtained to evaluate the vascular anatomy. Carotid stenosis measurements (when applicable) are obtained utilizing NASCET criteria, using the distal internal carotid diameter as the denominator. Multiphase CT imaging of the brain was performed following IV bolus contrast injection. Subsequent parametric perfusion maps were calculated using RAPID software. CONTRAST:  ISOVUE-370 IOPAMIDOL (ISOVUE-370) INJECTION 76% COMPARISON:  Head CT earlier same day FINDINGS: CTA NECK FINDINGS Aortic arch: Aortic atherosclerosis. No aneurysm or dissection. Branching pattern of the brachiocephalic vessels from the arch is normal. Right carotid system: Common carotid artery widely patent to the bifurcation region. There is soft and calcified plaque at the carotid bifurcation. Minimal diameter of the ICA bulb is 5 mm, this same as the more distal cervical ICA. Therefore no stenosis. Left carotid system: Common carotid artery widely patent to the bifurcation region. There is soft and calcified plaque at the carotid bifurcation and ICA bulb. Minimal diameter of  the proximal ICA is 4 mm. Compared to a more distal cervical ICA diameter of 5 mm, this indicates a 20% stenosis. Vertebral arteries: Both subclavian arteries show atherosclerotic change but no flow limiting stenosis. There is atherosclerotic plaque at the right vertebral artery origin with stenosis estimated at 30-50%. The left vertebral artery origin is widely patent. Both vertebral arteries are widely patent beyond that through the cervical region to the foramen magnum. Skeleton: Ordinary cervical spondylosis. Other neck: No mass or lymphadenopathy. Upper chest: Apical pleural and  parenchymal scarring. No active process. Review of the MIP images confirms the above findings CTA HEAD FINDINGS Anterior circulation: Both internal carotid arteries are patent through the skull base and siphon regions. There is extensive calcification in the carotid siphon regions but no stenosis more than 30% suspected. The anterior and middle cerebral vessels are patent without proximal stenosis, aneurysm or vascular malformation. No large or medium vessel occlusion identified. Posterior circulation: Both vertebral arteries are patent to the basilar. No basilar stenosis. Posterior circulation branch vessels are patent. Venous sinuses: Patent and normal. Anatomic variants: None significant. Delayed phase: No abnormal enhancement. Review of the MIP images confirms the above findings CT Brain Perfusion Findings: CBF (<30%) Volume: 0mL Perfusion (Tmax>6.0s) volume: 0mL Mismatch Volume: 0mL Infarction Location:None IMPRESSION: Negative perfusion study. Atherosclerotic disease at both carotid bifurcations. No stenosis on the right. 20% ICA stenosis on the left. Atherosclerotic calcification in both carotid siphon regions but without stenosis greater than 30%. No intracranial large or medium vessel occlusion or correctable proximal stenosis. 30-50% stenosis at the right vertebral artery origin. These results were communicated to at 4:01 pmon Dec 05, 2019by text page via the [facility] messaging system. Electronically Signed   By: Paulina Fusi M.D.   On: 2018/08/25 16:02   Ct Head Wo Contrast  Result Date: August 25, 2018 CLINICAL DATA:  Stroke follow-up EXAM: CT HEAD WITHOUT CONTRAST TECHNIQUE: Contiguous axial images were obtained from the base of the skull through the vertex without intravenous contrast. COMPARISON:  None. FINDINGS: Brain: There is subarachnoid hemorrhage overlying the right frontal lobe and inferior left temporal lobe. No associated midline shift or other mass effect. Size and configuration of the ventricles  are unchanged. Vascular: Atherosclerotic calcification of the vertebral and internal carotid arteries at the skull base. No abnormal hyperdensity of the major intracranial arteries or dural venous sinuses. Skull: The visualized skull base, calvarium and extracranial soft tissues are normal. Sinuses/Orbits: No fluid levels or advanced mucosal thickening of the visualized paranasal sinuses. No mastoid or middle ear effusion. The orbits are normal. IMPRESSION: Acute subarachnoid hemorrhage overlying the right frontal lobe and inferior left temporal lobe. No midline shift or other mass effect. Critical Value/emergent results were called by telephone at the time of interpretation on 08/25/2018 at 9:05 pm to Dr. Ritta Slot , who verbally acknowledged these results. Electronically Signed   By: Deatra Robinson M.D.   On: 08/25/2018 21:06   Ct Angio Neck W Or Wo Contrast  Result Date: 08/25/18 CLINICAL DATA:  Acute stroke presentation. Altered level of consciousness. EXAM: CT ANGIOGRAPHY HEAD AND NECK CT PERFUSION BRAIN TECHNIQUE: Multidetector CT imaging of the head and neck was performed using the standard protocol during bolus administration of intravenous contrast. Multiplanar CT image reconstructions and MIPs were obtained to evaluate the vascular anatomy. Carotid stenosis measurements (when applicable) are obtained utilizing NASCET criteria, using the distal internal carotid diameter as the denominator. Multiphase CT imaging of the brain was performed following IV bolus contrast injection. Subsequent parametric perfusion  maps were calculated using RAPID software. CONTRAST:  ISOVUE-370 IOPAMIDOL (ISOVUE-370) INJECTION 76% COMPARISON:  Head CT earlier same day FINDINGS: CTA NECK FINDINGS Aortic arch: Aortic atherosclerosis. No aneurysm or dissection. Branching pattern of the brachiocephalic vessels from the arch is normal. Right carotid system: Common carotid artery widely patent to the bifurcation  region. There is soft and calcified plaque at the carotid bifurcation. Minimal diameter of the ICA bulb is 5 mm, this same as the more distal cervical ICA. Therefore no stenosis. Left carotid system: Common carotid artery widely patent to the bifurcation region. There is soft and calcified plaque at the carotid bifurcation and ICA bulb. Minimal diameter of the proximal ICA is 4 mm. Compared to a more distal cervical ICA diameter of 5 mm, this indicates a 20% stenosis. Vertebral arteries: Both subclavian arteries show atherosclerotic change but no flow limiting stenosis. There is atherosclerotic plaque at the right vertebral artery origin with stenosis estimated at 30-50%. The left vertebral artery origin is widely patent. Both vertebral arteries are widely patent beyond that through the cervical region to the foramen magnum. Skeleton: Ordinary cervical spondylosis. Other neck: No mass or lymphadenopathy. Upper chest: Apical pleural and parenchymal scarring. No active process. Review of the MIP images confirms the above findings CTA HEAD FINDINGS Anterior circulation: Both internal carotid arteries are patent through the skull base and siphon regions. There is extensive calcification in the carotid siphon regions but no stenosis more than 30% suspected. The anterior and middle cerebral vessels are patent without proximal stenosis, aneurysm or vascular malformation. No large or medium vessel occlusion identified. Posterior circulation: Both vertebral arteries are patent to the basilar. No basilar stenosis. Posterior circulation branch vessels are patent. Venous sinuses: Patent and normal. Anatomic variants: None significant. Delayed phase: No abnormal enhancement. Review of the MIP images confirms the above findings CT Brain Perfusion Findings: CBF (<30%) Volume: 0mL Perfusion (Tmax>6.0s) volume: 0mL Mismatch Volume: 0mL Infarction Location:None IMPRESSION: Negative perfusion study. Atherosclerotic disease at both  carotid bifurcations. No stenosis on the right. 20% ICA stenosis on the left. Atherosclerotic calcification in both carotid siphon regions but without stenosis greater than 30%. No intracranial large or medium vessel occlusion or correctable proximal stenosis. 30-50% stenosis at the right vertebral artery origin. These results were communicated to at 4:01 pmon 11/09/2019by text page via the [facility] messaging system. Electronically Signed   By: Paulina Fusi M.D.   On: 08/17/2018 16:02   Ct Cerebral Perfusion W Contrast  Result Date: 08/18/2018 CLINICAL DATA:  Acute stroke presentation. Altered level of consciousness. EXAM: CT ANGIOGRAPHY HEAD AND NECK CT PERFUSION BRAIN TECHNIQUE: Multidetector CT imaging of the head and neck was performed using the standard protocol during bolus administration of intravenous contrast. Multiplanar CT image reconstructions and MIPs were obtained to evaluate the vascular anatomy. Carotid stenosis measurements (when applicable) are obtained utilizing NASCET criteria, using the distal internal carotid diameter as the denominator. Multiphase CT imaging of the brain was performed following IV bolus contrast injection. Subsequent parametric perfusion maps were calculated using RAPID software. CONTRAST:  ISOVUE-370 IOPAMIDOL (ISOVUE-370) INJECTION 76% COMPARISON:  Head CT earlier same day FINDINGS: CTA NECK FINDINGS Aortic arch: Aortic atherosclerosis. No aneurysm or dissection. Branching pattern of the brachiocephalic vessels from the arch is normal. Right carotid system: Common carotid artery widely patent to the bifurcation region. There is soft and calcified plaque at the carotid bifurcation. Minimal diameter of the ICA bulb is 5 mm, this same as the more distal cervical ICA. Therefore  no stenosis. Left carotid system: Common carotid artery widely patent to the bifurcation region. There is soft and calcified plaque at the carotid bifurcation and ICA bulb. Minimal diameter of the  proximal ICA is 4 mm. Compared to a more distal cervical ICA diameter of 5 mm, this indicates a 20% stenosis. Vertebral arteries: Both subclavian arteries show atherosclerotic change but no flow limiting stenosis. There is atherosclerotic plaque at the right vertebral artery origin with stenosis estimated at 30-50%. The left vertebral artery origin is widely patent. Both vertebral arteries are widely patent beyond that through the cervical region to the foramen magnum. Skeleton: Ordinary cervical spondylosis. Other neck: No mass or lymphadenopathy. Upper chest: Apical pleural and parenchymal scarring. No active process. Review of the MIP images confirms the above findings CTA HEAD FINDINGS Anterior circulation: Both internal carotid arteries are patent through the skull base and siphon regions. There is extensive calcification in the carotid siphon regions but no stenosis more than 30% suspected. The anterior and middle cerebral vessels are patent without proximal stenosis, aneurysm or vascular malformation. No large or medium vessel occlusion identified. Posterior circulation: Both vertebral arteries are patent to the basilar. No basilar stenosis. Posterior circulation branch vessels are patent. Venous sinuses: Patent and normal. Anatomic variants: None significant. Delayed phase: No abnormal enhancement. Review of the MIP images confirms the above findings CT Brain Perfusion Findings: CBF (<30%) Volume: 0mL Perfusion (Tmax>6.0s) volume: 0mL Mismatch Volume: 0mL Infarction Location:None IMPRESSION: Negative perfusion study. Atherosclerotic disease at both carotid bifurcations. No stenosis on the right. 20% ICA stenosis on the left. Atherosclerotic calcification in both carotid siphon regions but without stenosis greater than 30%. No intracranial large or medium vessel occlusion or correctable proximal stenosis. 30-50% stenosis at the right vertebral artery origin. These results were communicated to at 4:01 pmon  2019-12-01by text page via the [facility] messaging system. Electronically Signed   By: Paulina Fusi M.D.   On: 21-Aug-2018 16:02   Ct Head Code Stroke Wo Contrast  Result Date: August 21, 2018 CLINICAL DATA:  Code stroke.  Altered level of consciousness. EXAM: CT HEAD WITHOUT CONTRAST TECHNIQUE: Contiguous axial images were obtained from the base of the skull through the vertex without intravenous contrast. COMPARISON:  None. FINDINGS: Brain: No evidence of acute infarction, hemorrhage, hydrocephalus, extra-axial collection or mass lesion/mass effect. Generalized atrophy, mild to moderate for age. Vascular: Atherosclerotic calcification.  No hyperdense vessel. Skull: No acute finding Sinuses/Orbits: Negative Other: Motion artifact to a degree that findings could easily be obscured. These results were communicated to Dr. Wilford Corner at 3:16 pmon 01-Dec-2019by text page via the [facility] messaging system. ASPECTS [facility] Stroke Program Early CT Score) Unable to be accurately graded due to extent of motion artifact. IMPRESSION: Significantly motion degraded study without acute finding. Electronically Signed   By: Marnee Spring M.D.   On: Aug 21, 2018 15:17   2D echocardiogram Pending  EEG This EEG is characterized by slowing which is consistent with normal drowse.  Can not rule out the possibility of slowing related to general cerebral disturbance such as a metabolic encephalopathy.  Clinical correlation recommended.  No epileptiform activity is noted.     PHYSICAL EXAM Frail elderly Caucasian lady who is sedated. . Afebrile. Head is nontraumatic. Neck is supple without bruit.    Cardiac exam no murmur or gallop. Lungs are clear to auscultation. Distal pulses are well felt. Neurological Exam :  Patient is stuporose and lethargic. She moans and groans to sternal rub but will not open her eyes. She mumbles  a few words but speech is incomprehensible. She has spontaneous roving eye movements but will not follow commands.  Pupils irregular reactive. Fundi not visualized. Motor system exam she is able to move all 4 extremities well against gravity without focal weakness. Deep tendon reflexes are symmetric. Plantars are downgoing. Gait not tested. ASSESSMENT/PLAN Ms. Martyn Ehrichnn C Fountaine is a 84 y.o. female with history of PVD, HLD, HTN, anxiety [facility] with altered mental status.  Progressive confusion throughout the day.  In the emergency room, felt to have a new onset stroke.  Received IV tPA 07/31/2018 at 1532.  Patient with post TPA hemorrhage and very encephalopathic with temperature up to 103 with stiff neck.  Unable to do LP as post TPA.  Fibrinogen level normal.  Started on empiric meningitic antibiotic coverage.  Developed hypotension of unclear etiology.  Critical care physician consulted  Stroke:  Possible stroke s.p tPA with post tPA subarachnoid hemorrhage right frontal and left temporal lobe  Code Stroke CT head No acute stroke.  Motion degraded  CTA head & neck L ICA 20%.  R VA 30 to 50%.  Bilateral ICA bifurcation less than 30%.  CT perfusion negative  Repeat CT head post TPA shows acute subarachnoid hemorrhage overlying right frontal lobe and inferior left temporal lobe.  MRI  pending   2D Echo pending  EEG no seizure.  Drowsy.  LDL 76  HgbA1c 5.6  SCDs for VTE prophylaxis Diet Order            Diet NPO time specified  Diet effective now              aspirin 81 mg daily prior to admission, now on No antithrombotic given post tPA hmg  Therapy recommendations:  pending   Disposition:  pending   Febrile  WBC yest 11.4  Temp 103 during the night  Started on gabciclovir, meropenem, vanc 11/12  UA no infection.  Culture pending  CXR NAD  Code sepsis 11/12 am. CCM consulted.   Blood cultures no growth x2, less than 24 hours  Check labs in am  Hypotension  Hx hypertension  ? Sepsis  Treated with IVF  BP goals in place per  CCM . Aline placed 11/12 . SBP goal < 140 given acute hemorrhage . Home meds: tenormin 50 bidLong-term BP goal normotensive  Hyperlipidemia  Home meds:  lipitor 20, resumed in hospital  LDL 76, goal < 70  Continue statin at discharge  Other Stroke Risk Factors  Advanced age  PVD on pletal bid PTA  Other Active Problems  On estrace vaginal cream  Allergies on allegra and flonase PTA  Hospital day # 1  Annie MainSharon Biby, MSN, APRN, ANVP-BC, AGPCNP-BC Advanced Practice Stroke Nurse [facility] See Amion for Schedule & Pager information 08/02/2018 3:59 PM  I have personally examined this patient, reviewed notes, independently viewed imaging studies, participated in medical decision making and plan of care.ROS completed by me personally and pertinent positives fully documented  I have made any additions or clarifications directly to the above note. Agree with note above.  She presented with sudden onset of altered mental status with confusion and speech difficulties and was given IV TPA but has developed post TPA small hemorrhages, overnight became febrile and agitated requiring sedation and is now hypotensive. Recommend IV fluids for hydration and consult critical care for management of his sepsis. Check EEG, MRI scan of the brain and may consider spinal tap if meningitis  is still strongly suspected. Continue antibiotics for now. Long discussion with the patient's son and daughter at the bedside and answered questions. Discussed with Dr. Denese Killings.  This patient is critically ill and at significant risk of neurological worsening, death and care requires constant monitoring of vital signs, hemodynamics,respiratory and cardiac monitoring, extensive review of multiple databases, frequent neurological assessment, discussion with family, other specialists and medical decision making of high complexity.I have made any additions or clarifications directly to the above note.This critical  care time does not reflect procedure time, or teaching time or supervisory time of PA/NP/Med Resident etc but could involve care discussion time.  I spent 50 minutes of neurocritical care time  in the care of  this patient.     Delia Heady, MD Medical Director [facility] Stroke Center Pager: [phone] 08/02/2018 4:26 PM  To contact Stroke Continuity provider, please refer to [email or web address]. After hours, contact General Neurology

## 2018-08-02 NOTE — Progress Notes (Signed)
Due to equipment malfunction, neo gtt was not able to be brought into MRI scanning area. MD was at bedside, who pushed boluses of neo.  at 1625 200 mcg at 1635 200 mcg at 1645

## 2018-08-02 NOTE — Progress Notes (Signed)
OT Cancellation Note  Patient Details Name: Victoria Joseph MRN: [id number] DOB: [birth year]   Cancelled Treatment:    Reason Eval/Treat Not Completed: Active bedrest order  [facility] [name], OT/L   Acute OT Clinical Specialist Acute Rehabilitation Services Pager [phone] Office [phone]  08/02/2018, 9:07 AM

## 2018-08-02 NOTE — Procedures (Signed)
Arterial Catheter Insertion Procedure Note Victoria Joseph [id number] Jan 20, 1932  Procedure: Insertion of Arterial Catheter  Indications: Blood pressure monitoring  Procedure Details Consent: Risks of procedure as well as the alternatives and risks of each were explained to the (patient/caregiver).  Consent for procedure obtained. Time Out: Verified patient identification, verified procedure, site/side was marked, verified correct patient position, special equipment/implants available, medications/allergies/relevent history reviewed, required imaging and test results available.  Performed  Maximum sterile technique was used including antiseptics, cap, gloves, gown, hand hygiene, mask and sheet. Skin prep: Chlorhexidine; local anesthetic administered 20 gauge catheter was inserted into left radial artery using the Seldinger technique. ULTRASOUND GUIDANCE USED: NO Evaluation Blood flow good; BP tracing good. Complications: No apparent complications  .   Morley KosJohnson, Victoria Joseph 08/02/2018

## 2018-08-03 ENCOUNTER — Other Ambulatory Visit (HOSPITAL_COMMUNITY): Payer: Medicare HMO

## 2018-08-03 ENCOUNTER — Inpatient Hospital Stay (HOSPITAL_COMMUNITY): Payer: Medicare HMO

## 2018-08-03 DIAGNOSIS — R4182 Altered mental status, unspecified: Secondary | ICD-10-CM

## 2018-08-03 DIAGNOSIS — I739 Peripheral vascular disease, unspecified: Secondary | ICD-10-CM

## 2018-08-03 DIAGNOSIS — I340 Nonrheumatic mitral (valve) insufficiency: Secondary | ICD-10-CM

## 2018-08-03 LAB — GLUCOSE, CAPILLARY
GLUCOSE-CAPILLARY: 130 mg/dL — AB (ref 70–99)
GLUCOSE-CAPILLARY: 174 mg/dL — AB (ref 70–99)
Glucose-Capillary: 165 mg/dL — ABNORMAL HIGH (ref 70–99)
Glucose-Capillary: 127 mg/dL — ABNORMAL HIGH (ref 70–99)

## 2018-08-03 LAB — POCT I-STAT 3, ART BLOOD GAS (G3+)
ACID-BASE DEFICIT: 9 mmol/L — AB (ref 0.0–2.0)
ACID-BASE DEFICIT: 7 mmol/L — AB (ref 0.0–2.0)
BICARBONATE: 17.3 mmol/L — AB (ref 20.0–28.0)
Bicarbonate: 19.9 mmol/L — ABNORMAL LOW (ref 20.0–28.0)
O2 SAT: 100 %
O2 Saturation: 100 %
TCO2: 22 mmol/L (ref 22–32)
TCO2: 18 mmol/L — AB (ref 22–32)
pCO2 arterial: 54.3 mmHg — ABNORMAL HIGH (ref 32.0–48.0)
pCO2 arterial: 30 mmHg — ABNORMAL LOW (ref 32.0–48.0)
pH, Arterial: 7.174 — CL (ref 7.350–7.450)
pH, Arterial: 7.369 (ref 7.350–7.450)
pO2, Arterial: 434 mmHg — ABNORMAL HIGH (ref 83.0–108.0)
pO2, Arterial: 423 mmHg — ABNORMAL HIGH (ref 83.0–108.0)

## 2018-08-03 LAB — BASIC METABOLIC PANEL
ANION GAP: 11 (ref 5–15)
BUN: 6 mg/dL — ABNORMAL LOW (ref 8–23)
CALCIUM: 7.4 mg/dL — AB (ref 8.9–10.3)
CO2: 18 mmol/L — ABNORMAL LOW (ref 22–32)
Chloride: 106 mmol/L (ref 98–111)
Creatinine, Ser: 0.57 mg/dL (ref 0.44–1.00)
GFR calc Af Amer: >60 mL/min (ref >60)
GLUCOSE: 195 mg/dL — AB (ref 70–99)
Potassium: 2.3 mmol/L — CL (ref 3.5–5.1)
SODIUM: 135 mmol/L (ref 135–145)

## 2018-08-03 LAB — CBC
HCT: 38.5 % (ref 36.0–46.0)
Hemoglobin: 12.9 g/dL (ref 12.0–15.0)
MCH: 27.9 pg (ref 26.0–34.0)
MCHC: 33.5 g/dL (ref 30.0–36.0)
MCV: 83.3 fL (ref 80.0–100.0)
PLATELETS: 418 10*3/uL — AB (ref 150–400)
RBC: 4.62 MIL/uL (ref 3.87–5.11)
RDW: 15.1 % (ref 11.5–15.5)
WBC: 21.4 10*3/uL — ABNORMAL HIGH (ref 4.0–10.5)
nRBC: 0 % (ref 0.0–0.2)

## 2018-08-03 LAB — CSF CELL COUNT WITH DIFFERENTIAL
Eosinophils, CSF: 0 % (ref 0–1)
Lymphs, CSF: 4 % — ABNORMAL LOW (ref 40–80)
MONOCYTE-MACROPHAGE-SPINAL FLUID: 20 % (ref 15–45)
RBC COUNT CSF: 540 /mm3 — AB
Segmented Neutrophils-CSF: 76 % — ABNORMAL HIGH (ref 0–6)
Tube #: 4
WBC CSF: 16 /mm3 — AB (ref 0–5)

## 2018-08-03 LAB — BLOOD GAS, ARTERIAL
ACID-BASE DEFICIT: 5.1 mmol/L — AB (ref 0.0–2.0)
Bicarbonate: 17.9 mmol/L — ABNORMAL LOW (ref 20.0–28.0)
FIO2: 0.21
O2 Saturation: 94.8 %
PH ART: 7.478 — AB (ref 7.350–7.450)
Patient temperature: 98.6
pCO2 arterial: 24.4 mmHg — ABNORMAL LOW (ref 32.0–48.0)
pO2, Arterial: 69.6 mmHg — ABNORMAL LOW (ref 83.0–108.0)

## 2018-08-03 LAB — ECHOCARDIOGRAM COMPLETE
HEIGHTINCHES: 59 in
Weight: 1809.54 oz

## 2018-08-03 LAB — MAGNESIUM
MAGNESIUM: 1.4 mg/dL — AB (ref 1.7–2.4)
MAGNESIUM: 1.5 mg/dL — AB (ref 1.7–2.4)

## 2018-08-03 LAB — URINE CULTURE

## 2018-08-03 LAB — PROTEIN AND GLUCOSE, CSF
Glucose, CSF: 64 mg/dL (ref 40–70)
TOTAL PROTEIN, CSF: 48 mg/dL — AB (ref 15–45)

## 2018-08-03 LAB — PHOSPHORUS: Phosphorus: 1.2 mg/dL — ABNORMAL LOW (ref 2.5–4.6)

## 2018-08-03 LAB — POTASSIUM: Potassium: 2.4 mmol/L — CL (ref 3.5–5.1)

## 2018-08-03 MED ORDER — MIDAZOLAM HCL 2 MG/2ML IJ SOLN
2.0000 mg | Freq: Once | INTRAMUSCULAR | Status: AC
  Administered 2018-08-03: 2 mg via INTRAVENOUS

## 2018-08-03 MED ORDER — FENTANYL CITRATE (PF) 100 MCG/2ML IJ SOLN
50.0000 ug | INTRAMUSCULAR | Status: DC | PRN
  Administered 2018-08-03 – 2018-08-07 (×11): 50 ug via INTRAVENOUS
  Filled 2018-08-03 (×7): qty 2

## 2018-08-03 MED ORDER — POTASSIUM CHLORIDE 10 MEQ/50ML IV SOLN
10.0000 meq | INTRAVENOUS | Status: AC
  Administered 2018-08-03 – 2018-08-04 (×6): 10 meq via INTRAVENOUS
  Filled 2018-08-03 (×6): qty 50

## 2018-08-03 MED ORDER — ETOMIDATE 2 MG/ML IV SOLN
18.0000 mg | Freq: Once | INTRAVENOUS | Status: AC
  Administered 2018-08-03: 18 mg via INTRAVENOUS

## 2018-08-03 MED ORDER — CHLORHEXIDINE GLUCONATE CLOTH 2 % EX PADS
6.0000 | MEDICATED_PAD | Freq: Every day | CUTANEOUS | Status: DC
  Administered 2018-08-04 – 2018-08-05 (×3): 6 via TOPICAL

## 2018-08-03 MED ORDER — ORAL CARE MOUTH RINSE
15.0000 mL | OROMUCOSAL | Status: DC
  Administered 2018-08-03 – 2018-08-07 (×33): 15 mL via OROMUCOSAL

## 2018-08-03 MED ORDER — PERFLUTREN LIPID MICROSPHERE
1.0000 mL | INTRAVENOUS | Status: AC | PRN
  Administered 2018-08-03: 2 mL via INTRAVENOUS
  Filled 2018-08-03: qty 10

## 2018-08-03 MED ORDER — FENTANYL CITRATE (PF) 100 MCG/2ML IJ SOLN
INTRAMUSCULAR | Status: AC
  Administered 2018-08-03: 100 ug via INTRAVENOUS
  Filled 2018-08-03: qty 2

## 2018-08-03 MED ORDER — WHITE PETROLATUM EX OINT
TOPICAL_OINTMENT | CUTANEOUS | Status: AC
  Administered 2018-08-03: 0.2
  Filled 2018-08-03: qty 28.35

## 2018-08-03 MED ORDER — MIDAZOLAM HCL 2 MG/2ML IJ SOLN
INTRAMUSCULAR | Status: AC
  Administered 2018-08-03: 2 mg via INTRAVENOUS
  Filled 2018-08-03: qty 4

## 2018-08-03 MED ORDER — FUROSEMIDE 10 MG/ML IJ SOLN
40.0000 mg | Freq: Once | INTRAMUSCULAR | Status: AC
  Administered 2018-08-03: 40 mg via INTRAVENOUS

## 2018-08-03 MED ORDER — ROCURONIUM BROMIDE 50 MG/5ML IV SOLN
60.0000 mg | Freq: Once | INTRAVENOUS | Status: AC
  Administered 2018-08-03: 60 mg via INTRAVENOUS
  Filled 2018-08-03: qty 6

## 2018-08-03 MED ORDER — FUROSEMIDE 10 MG/ML IJ SOLN
INTRAMUSCULAR | Status: AC
  Administered 2018-08-03: 40 mg via INTRAVENOUS
  Filled 2018-08-03: qty 4

## 2018-08-03 MED ORDER — SODIUM CHLORIDE 0.9 % IV SOLN
0.0000 ug/min | INTRAVENOUS | Status: DC
  Filled 2018-08-03: qty 4

## 2018-08-03 MED ORDER — PERFLUTREN LIPID MICROSPHERE
INTRAVENOUS | Status: AC
  Administered 2018-08-03: 2 mL via INTRAVENOUS
  Filled 2018-08-03: qty 10

## 2018-08-03 MED ORDER — MAGNESIUM SULFATE 2 GM/50ML IV SOLN
2.0000 g | Freq: Once | INTRAVENOUS | Status: AC
  Administered 2018-08-03: 2 g via INTRAVENOUS
  Filled 2018-08-03: qty 50

## 2018-08-03 MED ORDER — FENTANYL CITRATE (PF) 100 MCG/2ML IJ SOLN
100.0000 ug | Freq: Once | INTRAMUSCULAR | Status: AC
  Administered 2018-08-03: 100 ug via INTRAVENOUS

## 2018-08-03 MED ORDER — PRO-STAT SUGAR FREE PO LIQD
30.0000 mL | Freq: Two times a day (BID) | ORAL | Status: DC
  Administered 2018-08-03 – 2018-08-04 (×3): 30 mL
  Filled 2018-08-03 (×3): qty 30

## 2018-08-03 MED ORDER — DEXMEDETOMIDINE HCL IN NACL 400 MCG/100ML IV SOLN
0.4000 ug/kg/h | INTRAVENOUS | Status: DC
  Administered 2018-08-03: 0.9 ug/kg/h via INTRAVENOUS
  Filled 2018-08-03: qty 100

## 2018-08-03 MED ORDER — CHLORHEXIDINE GLUCONATE 0.12% ORAL RINSE (MEDLINE KIT)
15.0000 mL | Freq: Two times a day (BID) | OROMUCOSAL | Status: DC
  Administered 2018-08-03 – 2018-08-07 (×8): 15 mL via OROMUCOSAL

## 2018-08-03 MED ORDER — POTASSIUM CHLORIDE 10 MEQ/50ML IV SOLN
10.0000 meq | INTRAVENOUS | Status: AC
  Administered 2018-08-03 (×10): 10 meq via INTRAVENOUS
  Filled 2018-08-03 (×3): qty 50

## 2018-08-03 MED ORDER — NOREPINEPHRINE 16 MG/250ML-% IV SOLN
0.0000 ug/min | INTRAVENOUS | Status: DC
  Administered 2018-08-03: 10 ug/min via INTRAVENOUS
  Administered 2018-08-06: 2 ug/min via INTRAVENOUS
  Filled 2018-08-03 (×2): qty 250

## 2018-08-03 MED ORDER — VITAL HIGH PROTEIN PO LIQD
1000.0000 mL | ORAL | Status: DC
  Administered 2018-08-03: 1000 mL

## 2018-08-03 NOTE — Procedures (Signed)
Indication: Fever, altered mental status  Risks of the procedure were dicussed with the patient including post-LP headache, bleeding, infection, weakness/numbness of legs(radiculopathy), death.  The patient's son agreed and telephone consent was obtained.   The patient was prepped and draped, and using sterile technique a 20 gauge quinke spinal needle was inserted in the L3-4 space.  On the first pass, bony resistance was met.  On the second pass initially bloody CSF which did clear was obtained.  The opening pressure was 12 cm H2O. Approximately 8 cc of CSF were obtained and sent for analysis.   Roland Rack, MD [facility] [phone]  If 7pm- 7am, please page neurology on call as listed in [place].

## 2018-08-03 NOTE — Progress Notes (Signed)
Expand All Collapse All      [] Hover for details   NAME:  Victoria Joseph, MRN:  [id number], DOB:  January 06, 1932, LOS: 1 ADMISSION DATE:  08/11/2018, CONSULTATION DATE:  08/02/2018 REFERRING MD:  Victoria Joseph - Neurology, CHIEF COMPLAINT:  08/02/2018   Brief History   84 year old woman admitted for altered mental status. Became febrile overnight and hypotensive this morning. PCCM has been consulted for possible sepsis and management of hypotension.  Significant Hospital Events   Started on vasopressors/given fluids for hypotension 11/12 MRI performed with moderate sedation 11/12 LP performed 11/12  Consults:  PCCM 11/12  Procedures:  LP 11/12 PICC line 11/12  Significant Diagnostic Tests:  MRI brain 11/12 - multifocal areas of ischemia with posterior predominance consistent with PRES.  Micro Data:  Blood cultures 11/12 - NGTD Urine cultures 11/12 - NGTD CSF 11/12 - NTGD  Antimicrobials:  Vancomycin 11/12 Meropenen  11/12  Interim history/subjective:  Remains confused and alternates between somnolence and agitation.  Objective   Blood pressure (!) 91/43, pulse 91, temperature (!) 100.4 F (38 C), temperature source Axillary, resp. rate (!) 23, height 4\' 11"  (1.499 m), weight 51.3 kg, SpO2 95 %.  Examination: General: Frail but well nourished elderly woman HENT: Dry mucous membranes. No lymphadenopathy. Normal fundi. Lungs: Kyphosis. Chest clear with no respiratory distress Cardiovascular: Extremities warm and well perfused, no JVD, no edema. HS are normal. Abdomen: soft and non-tender. Extremities: No rashes or swollen joints. Neuro: Eyes open . PERL.  Awake and follows commands but agitated and disoriented, speech is fluent but confused. No focal limb weakness. GU: Purewick in place  Resolved Hospital Problem list   N/A  Assessment & Plan:  Critically ill due to hypotension of unclear cause, sepsis remains a possibility.  Some improvement with fluid  administration (has received approx 37ml/kg) Altered mental status with MRI findings compatible with PRES, clinical presentation also consistent.  Meningitis less likely given normal glucose.  Plan: Wean pressors to keep MAP>65. Follow neurological exam and change BP threshold accordingly.  Remove arterial line as inaccurate due to patient movement. Continue antibiotics until cultures finalize.  Best practice:  Diet: NPO   - swallow evaluation    Pain/Anxiety/Delirium protocol (if indicated): dexmedetomidine iv, reorientation to day night cycle and location. VAP protocol (if indicated): N/A DVT prophylaxis: lovenox [place] starting tomorrow GI prophylaxis: N/A Glucose control: monitor for now. Mobility: bedrest with restrains to prevent self harm. Code Status: Full code. Family Communication: Children updated at bedside. Disposition: ICU  Labs   CBC: marked leukocytosis at 21.4 with neutrophil predominance and mild thromobocytosis at 418  Basic Metabolic Panel: severe hypokalemia at 2.3, non-AG acidosis at 18. Urinalysis 11/11: moderate RBC, trace WBC, Negative nitrite. Rate bacteria CSF: 540 RBC, 16 WBC neutrophil predominance. Normal glucose and marginally elevated total protein.  Liver Function Tests: normal on 11/12  Coagulation Profile: normal on 11/12  Cardiac Enzymes: negative.  CBG: control adequate    CRITICAL CARE Performed by: Victoria Joseph   Total critical care time:40 minutes  Critical care time was exclusive of separately billable procedures and treating other patients.  Critical care was necessary to treat or prevent imminent or life-threatening deterioration.  Critical care was time spent personally by me on the following activities: development of treatment plan with patient and/or surrogate as well as nursing, discussions with consultants, evaluation of patient's response to treatment, examination of patient, obtaining history from patient or  surrogate, ordering and performing treatments and interventions, ordering  and review of laboratory studies, ordering and review of radiographic studies, pulse oximetry, re-evaluation of patient's condition and participation in multidisciplinary rounds.  Victoria Catalanavi Victoria Lisbon, MD [facility] ICU Physician [facility]  Pager: [phone] Mobile: [phone] After hours: [phone].

## 2018-08-03 NOTE — Progress Notes (Signed)
CRITICAL VALUE ALERT  Critical Value:  CSF white count 16  Date & Time Notied:  11-13 0031  Provider Notified: Amada JupiterKirkpatrick  Orders Received/Actions taken: no action

## 2018-08-03 NOTE — Progress Notes (Signed)
Pt's SP02 reading in the 60s, Dr. Denese KillingsAgarwala called. Orders received to change vent settings to 100% Fi02, PEEP of 8, stat 40mg  IV Lasix and repeat ABG. RT notified of orders as well as vent changes.

## 2018-08-03 NOTE — Progress Notes (Signed)
  Echocardiogram 2D Echocardiogram has been performed.  Victoria Joseph 08/03/2018, 10:53 AM

## 2018-08-03 NOTE — Progress Notes (Signed)
eLink Physician-Brief Progress Note Patient Name: Victoria Joseph DOB: 1931/12/06 MRN: [id number]   Date of Service  08/03/2018  HPI/Events of Note  K 2.3, creatinine 0.57  eICU Interventions  Electrolyte replacement protocol ordered Magnesium level ordered     Intervention Category Major Interventions: Electrolyte abnormality - evaluation and management  Darl Pikesmily T Arriyana Rodell 08/03/2018, 6:14 AM

## 2018-08-03 NOTE — Progress Notes (Signed)
Per Dr. Denese KillingsAgarwala, new blood pressure parameters are MAP > 65 by cuff.

## 2018-08-03 NOTE — Consult Note (Signed)
Vascular and Vein Specialist of [facility]  Patient name: Victoria Joseph MRN: [id number] DOB: Aug 01, 1932 Sex: female   REQUESTING PROVIDER:    CCM   REASON FOR CONSULT:    Discolored left hand  HISTORY OF PRESENT ILLNESS:   Victoria Joseph is a 83 y.o. female, who was admitted on 08/04/2018 with altered mental status.  Her initial CT scan of the brain and CT angiogram of the neck showed significant motion artifact without acute findings and no perfusion deficit.  She received TPA follow-up head CT shows small intraparenchymal hematoma in the left parietal region.   she spiked a fever to 103 and was felt to be septic she also became hypotensive.  Spinal tap was negative for meningitis.  She was started on antibiotics.  On 1113 she required intubation for respiratory insufficiency.  A arterial line has been placed in her left radial artery.  This was removed earlier today.  Throughout the day it was noted that she had progressive changes in the color of her skin on the left hand.  She is requiring significant pressors for her hypotension.  I was asked to evaluate her left hand.  The patient has a history of peripheral vascular disease diagnosed in early 2000.  Per the chart, she has minimal claudication symptoms.  She is on Pletal.  She does exercise regularly with a stationary bike and classes.  She takes a statin for hypercholesterolemia.  She has a history of palpitations and PVCs as well as mild mitral regurgitation.  PAST MEDICAL HISTORY    Past Medical History:  Diagnosis Date  . Anxiety   . Arrhythmia    PVCs  . Diverticulosis   . GERD (gastroesophageal reflux disease)   . Heart murmur   . Hiatal hernia   . Hyperlipidemia   . Hypertension   . IBS (irritable bowel syndrome)   . Osteoarthritis   . Osteoporosis   . Pneumonia 2009  . PVD (peripheral vascular disease) ([place])   . Rhinitis, allergic   . Sinusitis   . URI (upper respiratory  infection)      FAMILY HISTORY   Family History  Problem Relation Age of Onset  . Heart failure Mother   . Asthma Paternal Grandfather   . Colon cancer Neg Hx   . Heart attack Neg Hx   . Stroke Neg Hx   . Hypertension Neg Hx     SOCIAL HISTORY:   Social History   Socioeconomic History  . Marital status: Widowed    Spouse name: Not on file  . Number of children: 2  . Years of education: Not on file  . Highest education level: Not on file  Occupational History  . 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 Financial resource strain: Not on file  . Food insecurity:    Worry: Not on file    Inability: Not on file  . Transportation needs:    Medical: Not on file    Non-medical: Not on file  Tobacco Use  . Smoking status: Never Smoker  . Smokeless tobacco: Never Used  . Tobacco comment: has exposure to 2nd hand smoke  Substance and Sexual Activity  . Alcohol use: No  . Drug use: No  . Sexual activity: Not on file  Lifestyle  . Physical activity:    Days per week: Not on file    Minutes per session: Not on file  .  Stress: Not on file  Relationships  . Social connections:    Talks on phone: Not on file    Gets together: Not on file    Attends religious service: Not on file    Active member of club or organization: Not on file    Attends meetings of clubs or organizations: Not on file    Relationship status: Not on file  . Intimate partner violence:    Fear of current or ex partner: Not on file    Emotionally abused: Not on file    Physically abused: Not on file    Forced sexual activity: Not on file  Other Topics Concern  . Not on file  Social History Narrative  . Not on file    ALLERGIES:    Allergies  Allergen Reactions  . Penicillins Hives, Other (See Comments) and Rash    Has patient had a PCN reaction causing immediate rash, facial/tongue/throat swelling, SOB or lightheadedness with  hypotension: No Has patient had a PCN reaction causing severe rash involving mucus membranes or skin necrosis: No Has patient had a PCN reaction that required hospitalization No Has patient had a PCN reaction occurring within the last 10 years: No If all of the above answers are "NO", then may proceed with Cephalosporin use.  . Sulfa Antibiotics     nausea    CURRENT MEDICATIONS:    Current Facility-Administered Medications  Medication Dose Route Frequency Provider Last Rate Last Dose  .  stroke: mapping our early stages of recovery book   Does not apply Once Marliss Coots, PA-C      . 0.9 %  sodium chloride infusion   Intravenous Continuous Lajean Saver, MD   Stopped at 08/02/18 1518  . 0.9 %  sodium chloride infusion   Intravenous Continuous Kipp Brood, MD   Stopped at 08/03/18 1809  . 0.9 %  sodium chloride infusion   Intra-arterial PRN Greta Doom, MD      . acetaminophen (TYLENOL) suppository 650 mg  650 mg Rectal Q4H PRN Greta Doom, MD   650 mg at 07/30/2018 2118  . amitriptyline (ELAVIL) tablet 25 mg  25 mg Oral QHS Marliss Coots, PA-C      . atorvastatin (LIPITOR) tablet 20 mg  20 mg Oral Daily Marliss Coots, PA-C      . calcium-vitamin D (OSCAL WITH D) 500-200 MG-UNIT per tablet 1 tablet  1 tablet Oral BID Corinda Gubler, RPH      . chlorhexidine gluconate (MEDLINE KIT) (PERIDEX) 0.12 % solution 15 mL  15 mL Mouth Rinse BID Mauri Brooklyn, MD      . Chlorhexidine Gluconate Cloth 2 % PADS 6 each  6 each Topical Daily Garvin Fila, MD   Stopped at 08/03/18 0932  . dexmedetomidine (PRECEDEX) 400 MCG/100ML (4 mcg/mL) infusion  0.4-1.2 mcg/kg/hr Intravenous Titrated Skeet Simmer, [facility]   Stopped at 08/03/18 1746  . enoxaparin (LOVENOX) injection 40 mg  40 mg Subcutaneous Q24H Kipp Brood, MD   40 mg at 08/03/18 0905  . feeding supplement (PRO-STAT SUGAR FREE 64) liquid 30 mL  30 mL Per Tube BID Kipp Brood, MD   30 mL at 08/03/18 1549  .  feeding supplement (VITAL HIGH PROTEIN) liquid 1,000 mL  1,000 mL Per Tube Q24H Agarwala, Ravi, MD   1,000 mL at 08/03/18 1535  . fentaNYL (SUBLIMAZE) injection 50 mcg  50 mcg Intravenous Q1H PRN Kipp Brood, MD   50 mcg at  08/03/18 1944  . MEDLINE mouth rinse  15 mL Mouth Rinse 10 times per day Mauri Brooklyn, MD      . norepinephrine (LEVOPHED) 16 mg in D5W 277m premix infusion  0-40 mcg/min Intravenous Titrated AKipp Brood MD 16.88 mL/hr at 08/03/18 1900 18 mcg/min at 08/03/18 1900  . pantoprazole (PROTONIX) injection 40 mg  40 mg Intravenous QHS SMarliss Coots PA-C   40 mg at 08/02/18 2127  . phenylephrine (NEO-SYNEPHRINE) 40 mg in sodium chloride 0.9 % 250 mL (0.16 mg/mL) infusion  0-400 mcg/min Intravenous Titrated AKipp Brood MD   Stopped at 08/03/18 1438  . potassium chloride 10 mEq in 50 mL *CENTRAL LINE* IVPB  10 mEq Intravenous Q1 Hr x 6 SMauri Brooklyn MD      . senna-docusate (Senokot-S) tablet 1 tablet  1 tablet Oral QHS PRN SMarliss Coots PA-C      . sodium chloride flush (NS) 0.9 % injection 10-40 mL  10-40 mL Intracatheter Q12H SGarvin Fila MD   10 mL at 08/02/18 2128  . sodium chloride flush (NS) 0.9 % injection 10-40 mL  10-40 mL Intracatheter PRN SGarvin Fila MD   10 mL at 08/03/18 0025    REVIEW OF SYSTEMS:   Unable to obtain secondary to intubation.  No family available.   '[X]'$  denotes positive finding, '[ ]'$  denotes negative finding Cardiac  Comments:  Chest pain or chest pressure:    Shortness of breath upon exertion:    Short of breath when lying flat:    Irregular heart rhythm:        Vascular    Pain in calf, thigh, or hip brought on by ambulation:    Pain in feet at night that wakes you up from your sleep:     Blood clot in your veins:    Leg swelling:         Pulmonary    Oxygen at home:    Productive cough:     Wheezing:         Neurologic    Sudden weakness in arms or legs:     Sudden numbness in arms or legs:     Sudden onset of  difficulty speaking or slurred speech:    Temporary loss of vision in one eye:     Problems with dizziness:         Gastrointestinal    Blood in stool:      Vomited blood:         Genitourinary    Burning when urinating:     Blood in urine:        Psychiatric    Major depression:         Hematologic    Bleeding problems:    Problems with blood clotting too easily:        Skin    Rashes or ulcers:        Constitutional    Fever or chills:     PHYSICAL EXAM:   Vitals:   08/03/18 1900 08/03/18 1915 08/03/18 1930 08/03/18 2000  BP:      Pulse: 73     Resp: 19 (!) 40 20   Temp:    97.7 F (36.5 C)  TempSrc:    Axillary  SpO2: 93%  94%   Weight:      Height:        GENERAL: The patient is agitated on the ventilator CARDIAC: There is a regular rate and rhythm.  VASCULAR: She has a dampened Doppler signal in the left brachial artery and I was unable to find Doppler signals in the radial or ulnar artery on the left hand.  There were brisk signals on the right. PULMONARY: Intubated ABDOMEN: Soft and non-tender with normal pitched bowel sounds.  MUSCULOSKELETAL: There are no major deformities or cyanosis. NEUROLOGIC: No focal weakness or paresthesias are detected. SKIN: The left hand is slightly cooler than the right.  There is ecchymosis around the arterial line site.  The color is slightly different than the right. PSYCHIATRIC: The patient has a normal affect.  STUDIES:    I have reviewed the following studies:  MRA Brain: 1. Multifocal acute ischemia and subarachnoid hemorrhage superimposed on bilateral posterior predominant white matter hyperintensity. 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 2. Areas of subarachnoid hemorrhage in the right frontal and left temporal lobes are unchanged. There are new foci of hemorrhage in the left occipital lobe compared to the earlier head CT. 3. No emergent large vessel  occlusion or high-grade stenosis of the intracranial arteries.  CTA Neck: Negative perfusion study.  Atherosclerotic disease at both carotid bifurcations. No stenosis on the right. 20% ICA stenosis on the left.  Atherosclerotic calcification in both carotid siphon regions but without stenosis greater than 30%.  No intracranial large or medium vessel occlusion or correctable proximal stenosis.  30-50% stenosis at the right vertebral artery origin  ASSESSMENT and PLAN   Left hand: This is a very difficult management situation.  On exam the patient has dampened Doppler signals in the left brachial artery and I am unable to obtain Doppler signals in the radial and ulnar artery.  Reportedly, the hand was more mottled earlier today than it is currently.  Currently, the color is very similar to the right hand.  I suspect that she has thrombosed her left radial artery when the arterial line was removed.  The patient currently has a significant pressor requirement to treat her hypotension.  I suspect this is playing a role in the perfusion of her left hand.  Because of her ongoing respiratory issues and pressor requirement, I do not think that it is safe to transport her to the operating room for exploration.  Fortunately, the hand does not appear to be frankly ischemic at this time.  She cannot be heparinized because of her intracranial bleed.  Therefore, I think the best course of action is to try and wean her pressor requirement.  I will get a Doppler study of her left arm tomorrow.  Further recommendations will be based on the results of the study.  She is certainly at risk for deterioration of her left hand, however I think she has bigger issues at play currently.  I have discussed this with the critical care team who will contact me tonight should they notice a change in the appearance of her hand.   Annamarie Major, MD [facility]in Specialists of [facility] [phone] Pager  [phone]

## 2018-08-03 NOTE — Progress Notes (Signed)
STROKE TEAM PROGRESS NOTE   INTERVAL HISTORY Her son and daughter / dtr-in law are at the bedside.   She seems neurologically improved and is more oriented and conversant the has some intermittent confusion. MRI scan of the brain yesterday showed multifocal mostly posterior T2/FLAIR hyperintensities suggestive of posterior reversible encephalopathy syndrome. Stable appearance of the left parietal and right frontal small hemorrhages. Spinal tap performed by Dr. Amada Jupiter showed no evidence of meningitis.  Vitals:   08/03/18 1500 08/03/18 1515 08/03/18 1530 08/03/18 1545  BP: (!) 33/16     Pulse: 98 96 85   Resp: 20 (!) 24 (!) 22 (!) 28  Temp: 98.4 F (36.9 C) 98.4 F (36.9 C) 98.2 F (36.8 C) 98.1 F (36.7 C)  TempSrc:      SpO2: 100% 100% 100% 98%  Weight:      Height:        CBC:  Recent Labs  Lab August 02, 2018 1446 08-02-18 1501 08/03/18 0501  WBC 11.4*  --  21.4*  NEUTROABS 8.2*  --   --   HGB 12.4 13.3 12.9  HCT 40.9 39.0 38.5  MCV 88.1  --  83.3  PLT 367  --  418*    Basic Metabolic Panel:  Recent Labs  Lab August 02, 2018 1446 08/02/18 1501 08/03/18 0501  NA 134* 136 135  K 3.2* 3.3* 2.3*  CL 102 104 106  CO2 23  --  18*  GLUCOSE 118* 117* 195*  BUN 13 15 6*  CREATININE 0.60 0.50 0.57  CALCIUM 9.0  --  7.4*  MG  --   --  1.4*   Lipid Panel:     Component Value Date/Time   CHOL 157 08/02/2018 0438   TRIG 83 08/02/2018 0438   HDL 64 08/02/2018 0438   CHOLHDL 2.5 08/02/2018 0438   VLDL 17 08/02/2018 0438   LDLCALC 76 08/02/2018 0438   HgbA1c:  Lab Results  Component Value Date   HGBA1C 5.6 08/02/2018   Urine Drug Screen:     Component Value Date/Time   LABOPIA NONE DETECTED 2018-08-02 2110   COCAINSCRNUR NONE DETECTED 08-02-18 2110   LABBENZ NONE DETECTED 08/02/18 2110   AMPHETMU NONE DETECTED 08-02-18 2110   THCU NONE DETECTED August 02, 2018 2110   LABBARB NONE DETECTED August 02, 2018 2110    Alcohol Level     Component Value Date/Time   ETH <10  August 02, 2018 1515    IMAGING Ct Head Wo Contrast  Result Date: 08/02/18 CLINICAL DATA:  Stroke follow-up EXAM: CT HEAD WITHOUT CONTRAST TECHNIQUE: Contiguous axial images were obtained from the base of the skull through the vertex without intravenous contrast. COMPARISON:  None. FINDINGS: Brain: There is subarachnoid hemorrhage overlying the right frontal lobe and inferior left temporal lobe. No associated midline shift or other mass effect. Size and configuration of the ventricles are unchanged. Vascular: Atherosclerotic calcification of the vertebral and internal carotid arteries at the skull base. No abnormal hyperdensity of the major intracranial arteries or dural venous sinuses. Skull: The visualized skull base, calvarium and extracranial soft tissues are normal. Sinuses/Orbits: No fluid levels or advanced mucosal thickening of the visualized paranasal sinuses. No mastoid or middle ear effusion. The orbits are normal. IMPRESSION: Acute subarachnoid hemorrhage overlying the right frontal lobe and inferior left temporal lobe. No midline shift or other mass effect. Critical Value/emergent results were called by telephone at the time of interpretation on 08-02-18 at 9:05 pm to Dr. Ritta Slot , who verbally acknowledged these results. Electronically Signed  By: Deatra RobinsonKevin  Herman M.D.   On: 08/20/2018 21:06   Mr Maxine GlennMra Head Wo Contrast  Result Date: 08/02/2018 CLINICAL DATA:  Intracranial hemorrhage EXAM: MRI HEAD WITHOUT AND WITH CONTRAST MRA HEAD WITHOUT CONTRAST TECHNIQUE: Multiplanar, multiecho pulse sequences of the brain and surrounding structures were obtained without and with intravenous contrast. Angiographic images of the head were obtained using MRA technique without contrast. CONTRAST:  5 mL Gadavist COMPARISON:  Head CT 08/09/2018 FINDINGS: MRI HEAD FINDINGS BRAIN: There is abnormal magnetic susceptibility effect and diffusion-weighted signal at the site of previously demonstrated  subarachnoid hemorrhage of the left temporal lobe and right frontal lobe. There are multiple foci of diffusion abnormality within the left occipital lobe with associated blooming on susceptibility weighted imaging. The midline structures are normal. There is posterior predominant hyperintense T2-weighted signal of the white matter. Generalized atrophy without lobar predilection. SKULL AND UPPER CERVICAL SPINE: The visualized skull base, calvarium, upper cervical spine and extracranial soft tissues are normal. SINUSES/ORBITS: No fluid levels or advanced mucosal thickening. No mastoid or middle ear effusion. The orbits are normal. MRA HEAD FINDINGS Intracranial internal carotid arteries: Normal. Anterior cerebral arteries: Normal. Middle cerebral arteries: Normal. Posterior communicating arteries: Absent bilaterally. Posterior cerebral arteries: Normal. Basilar artery: Normal. Vertebral arteries: Left dominant. Normal. Superior cerebellar arteries: Normal. Inferior cerebellar arteries: Anterior inferior cerebellar arteries are not clearly visualized. Posterior inferior cerebellar arteries are normal. IMPRESSION: 1. Multifocal acute ischemia and subarachnoid hemorrhage superimposed on bilateral posterior predominant white matter hyperintensity. 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 2. Areas of subarachnoid hemorrhage in the right frontal and left temporal lobes are unchanged. There are new foci of hemorrhage in the left occipital lobe compared to the earlier head CT. 3. No emergent large vessel occlusion or high-grade stenosis of the intracranial arteries. Electronically Signed   By: Deatra RobinsonKevin  Herman M.D.   On: 08/02/2018 18:54   Mr Laqueta JeanBrain W ZOWo Contrast  Result Date: 08/02/2018 CLINICAL DATA:  Intracranial hemorrhage EXAM: MRI HEAD WITHOUT AND WITH CONTRAST MRA HEAD WITHOUT CONTRAST TECHNIQUE: Multiplanar, multiecho pulse sequences of the brain and surrounding  structures were obtained without and with intravenous contrast. Angiographic images of the head were obtained using MRA technique without contrast. CONTRAST:  5 mL Gadavist COMPARISON:  Head CT 08/03/2018 FINDINGS: MRI HEAD FINDINGS BRAIN: There is abnormal magnetic susceptibility effect and diffusion-weighted signal at the site of previously demonstrated subarachnoid hemorrhage of the left temporal lobe and right frontal lobe. There are multiple foci of diffusion abnormality within the left occipital lobe with associated blooming on susceptibility weighted imaging. The midline structures are normal. There is posterior predominant hyperintense T2-weighted signal of the white matter. Generalized atrophy without lobar predilection. SKULL AND UPPER CERVICAL SPINE: The visualized skull base, calvarium, upper cervical spine and extracranial soft tissues are normal. SINUSES/ORBITS: No fluid levels or advanced mucosal thickening. No mastoid or middle ear effusion. The orbits are normal. MRA HEAD FINDINGS Intracranial internal carotid arteries: Normal. Anterior cerebral arteries: Normal. Middle cerebral arteries: Normal. Posterior communicating arteries: Absent bilaterally. Posterior cerebral arteries: Normal. Basilar artery: Normal. Vertebral arteries: Left dominant. Normal. Superior cerebellar arteries: Normal. Inferior cerebellar arteries: Anterior inferior cerebellar arteries are not clearly visualized. Posterior inferior cerebellar arteries are normal. IMPRESSION: 1. Multifocal acute ischemia and subarachnoid hemorrhage superimposed on bilateral posterior predominant white matter hyperintensity. 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 2. Areas of subarachnoid hemorrhage in the right frontal and left temporal lobes are unchanged. There are  new foci of hemorrhage in the left occipital lobe compared to the earlier head CT. 3. No emergent large vessel occlusion or  high-grade stenosis of the intracranial arteries. Electronically Signed   By: Deatra Robinson M.D.   On: 08/02/2018 18:54   Dg Chest Port 1 View  Result Date: 08/03/2018 CLINICAL DATA:  Endotracheal tube placement EXAM: PORTABLE CHEST 1 VIEW COMPARISON:  08/02/2018 FINDINGS: Endotracheal tube with the tip 2.2 cm above the carina. Nasogastric tube with the tip projecting over the stomach. Right-sided PICC line with the tip projecting over the SVC. Bilateral diffuse interstitial thickening. No pneumothorax. Possible trace right pleural effusion. Stable cardiomediastinal silhouette. No acute osseous abnormality. IMPRESSION: 1. Support lines and tubing in satisfactory position. 2. Bilateral diffuse mild interstitial thickening which may reflect pulmonary edema versus multilobar pneumonia. Electronically Signed   By: Elige Ko   On: 08/03/2018 13:04   Dg Chest Port 1 View  Result Date: 08/02/2018 CLINICAL DATA:  Febrile. EXAM: PORTABLE CHEST 1 VIEW COMPARISON:  12/14/2009 FINDINGS: Heart size and pulmonary vascularity are normal. Lungs are clear. Density in the lower mediastinum probably represents a hiatal hernia. No acute bone abnormality. Old compression fractures. IMPRESSION: No acute abnormalities. Electronically Signed   By: Francene Boyers M.D.   On: 08/02/2018 09:26   [place] Ekg Site Rite  Result Date: 08/02/2018 If Site Rite image not attached, placement could not be confirmed due to current cardiac rhythm.  2D echocardiogram Pending  EEG This EEG is characterized by slowing which is consistent with normal drowse.  Can not rule out the possibility of slowing related to general cerebral disturbance such as a metabolic encephalopathy.  Clinical correlation recommended.  No epileptiform activity is noted.     PHYSICAL EXAM Frail elderly Caucasian lady who is sedated. . Afebrile. Head is nontraumatic. Neck is supple without bruit.    Cardiac exam no murmur or gallop. Lungs are clear to  auscultation. Distal pulses are well felt. Neurological Exam :  Patient is awake and alert. She is oriented 2. Diminished attention, registration and recall. She is easily distractible. Speech appears clear without definite aphasia or dysarthria. Pupils irregular reactive. Fundi not visualized. Motor system exam she is able to move all 4 extremities well against gravity without focal weakness. Deep tendon reflexes are symmetric. Plantars are downgoing. Gait not tested. ASSESSMENT/PLAN Victoria Joseph is a 84 y.o. female with history of PVD, HLD, HTN, anxiety presenting from [facility] with altered mental status.  Progressive confusion throughout the day.  In the emergency room, felt to have a new onset stroke.  Received IV tPA 08/13/18 at 1532.  Patient with post TPA hemorrhage and very encephalopathic with temperature up to 103 with stiff neck.  Unable to do LP as post TPA.  Fibrinogen level normal.  Started on empiric meningitic antibiotic coverage.  Developed hypotension of unclear etiology.  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  Code Stroke CT head No acute stroke.  Motion degraded  CTA head & neck L ICA 20%.  R VA 30 to 50%.  Bilateral ICA bifurcation less than 30%.  CT perfusion negative  Repeat CT head post TPA shows acute subarachnoid hemorrhage overlying right frontal lobe and inferior left temporal lobe. MRI  Multifocal acute ischemia and subarachnoid hemorrhage superimposed on bilateral posterior predominant white matter hyperintensity. Together, findings are most suggestive  of posterior reversible encephalopathy syndrome (PRES).Areas of subarachnoid hemorrhage in the right frontal and left temporal lobes are unchanged. There are new foci of hemorrhage in the left occipital lobe compared to the  earlier head CT  2D Echo Moderately diminished ejection fraction 30-35% with normal PICA thrombus. Anterior, anterior septal, apical and inferior pico severe hypokinesis.  EEG no seizure.  Drowsy.  LDL 76  HgbA1c 5.6  SCDs for VTE prophylaxis Diet Order            Diet NPO time specified  Diet effective now              aspirin 81 mg daily prior to admission, now on No antithrombotic given post tPA hmg  Therapy recommendations:  pending   Disposition:  pending   Febrile  WBC yest 11.4  Temp 103 during the night  Started on gabciclovir, meropenem, vanc 11/12  UA no infection.  Culture pending  CXR NAD  Code sepsis 11/12 am. CCM consulted.   Blood cultures no growth x2, less than 24 hours  Check labs in am  Hypotension  Hx hypertension  ? Sepsis  Treated with IVF  BP goals in place per CCM . Aline placed 11/12 . SBP goal < 140 given acute hemorrhage . Home meds: tenormin 50 bidLong-term BP goal normotensive  Hyperlipidemia  Home meds:  lipitor 20, resumed in hospital  LDL 76, goal < 70  Continue statin at discharge  Other Stroke Risk Factors  Advanced age  PVD on pletal bid PTA  Other Active Problems  On estrace vaginal cream  Allergies on allegra and flonase PTA  Hospital day # 2    I have personally examined this patient, reviewed notes, independently viewed imaging studies, participated in medical decision making and plan of care.ROS completed by me personally and pertinent positives fully documented  I have made any additions or clarifications directly to the above note.    She presented with sudden onset of altered mental status with confusion and speech difficulties and was given IV TPA but has developed small post TPA small hemorrhages, overnight became febrile and agitated requiring sedation and is now hypotensive. Recommend IV fluids for hydration and  Ask any suggestive of posterior reversible encephalopathy syndrome in stable  appearance of post TPA hemorrhage is supple. Spinal tap and MRI do not suggest meningitis.. Continue antibiotics for now till sepsis is ruled out. Long discussion with Dr. Denese Killings. and patient's son and daughter at the bedside and answered questions.  .  This patient is critically ill and at significant risk of neurological worsening, death and care requires constant monitoring of vital signs, hemodynamics,respiratory and cardiac monitoring, extensive review of multiple databases, frequent neurological assessment, discussion with family, other specialists and medical decision making of high complexity.I have made any additions or clarifications directly to the above note.This critical care time does not reflect procedure time, or teaching time or supervisory time of PA/NP/Med Resident etc but could involve care discussion time.  I spent 50 minutes of neurocritical care time  in the care of  this patient.     Delia Heady, MD Medical Director [facility] Pager: [phone] 08/03/2018 4:09 PM  To contact Stroke Continuity provider, please refer to [email or web address]. After hours, contact General Neurology

## 2018-08-03 NOTE — Progress Notes (Signed)
Pt with new increased agitation and confusion, tachycardic to the 140s and tachypneic to the 50's, in obvious respiratory distress. Accurate Sp02 unable to be obtained due to perfusion and agitation. Crackles auscultated in bilateral lung bases. Dr. Denese KillingsAgarwala called, orders received for stat CXR and BiPAP. RT notified.   1228: Pt on BiPAP with RR in the 60s, diaphoretic with severe agitation. Dr. Denese KillingsAgarwala called, instructed to prepare to intubate. Family updated in waiting room.

## 2018-08-03 NOTE — Progress Notes (Signed)
RT was called to place patient on bipap. Patient was unable to tolerate bipap RR was in the 50's. MD was called and RT and RN were instructed to setup equipment for intubation.

## 2018-08-03 NOTE — Progress Notes (Signed)
Dr. Denese KillingsAgarwala notified of worsening mottling and dusky fingers of left upper extremity

## 2018-08-03 NOTE — Procedures (Signed)
Intubation Procedure Note Victoria Joseph [id number] 1931-12-07  Procedure: Intubation Indications: Respiratory insufficiency  Procedure Details Consent: Risks of procedure as well as the alternatives and risks of each were explained to the (patient/caregiver).  Consent for procedure obtained. and Unable to obtain consent because of emergent medical necessity. Time Out: Verified patient identification, verified procedure, site/side was marked, verified correct patient position, special equipment/implants available, medications/allergies/relevent history reviewed, required imaging and test results available.  Performed  Pre-oxygenation: 100% via  Premedication: Versed 2mg , fentanyl 50mcg Position: supine Induction agent: etomidate 15mg  Paralytic: rocuronium 60mg  Technique: Glidescope 3  Tube size: 7.555mm Laryngoscopy view: grade 1 Number of attempts: 1 Insertion depth: 20cm Tube secured: tube holder Other findings: easy airway.  OGT/NGT inserted: yes Position confirmed by auscultation: yes  Evaluation Colorimetric change: Bilateral breath sounds: Hemodynamic Status: BP stable throughout; O2 sats: stable throughout Patient's Current Condition: stable Complications: No apparent complications Patient did tolerate procedure well. Chest X-ray ordered to verify placement.  CXR: tube position acceptable.   Victoria Joseph Victoria Joseph 08/03/2018

## 2018-08-03 NOTE — Progress Notes (Signed)
eLink Physician-Brief Progress Note Patient Name: Victoria Joseph DOB: Mar 19, 1932 MRN: [id number]   Date of Service  08/03/2018  HPI/Events of Note  Low potassium   eICU Interventions  replaced     Intervention Category Intermediate Interventions: Electrolyte abnormality - evaluation and management  Henry RusselSMITH, Jaiel Saraceno, P 08/03/2018, 7:51 PM

## 2018-08-03 NOTE — Progress Notes (Signed)
OT Cancellation Note  Patient Details Name: Victoria Joseph MRN: [id number] DOB: 1931/10/24   Cancelled Treatment:    Reason Eval/Treat Not Completed: Medical issues which prohibited therapy. RR 47; HR 116 lying in bed. Will check back later if pt is appropriate.   [name],[name]  [name], OT/L   Acute OT Clinical Specialist Acute Rehabilitation Services Pager [phone] Office [phone]  08/03/2018, 11:54 AM

## 2018-08-03 NOTE — Progress Notes (Signed)
Per Dr. Denese KillingsAgarwala, titrated pressors to MAP > 60

## 2018-08-03 NOTE — Progress Notes (Signed)
PT Cancellation Note  Patient Details Name: Victoria Joseph C Brougher MRN: [id number] DOB: 1932-09-10   Cancelled Treatment:    Reason Eval/Treat Not Completed: Patient not medically ready. RR 47; HR 116bpm lying in bed. PT to return as able, as appropriate.  Lewis ShockAshly Sherril Shipman, PT, DPT Acute Rehabilitation Services Pager #: [phone] Office #: [phone]    Iona Hansenshly M Briahna Pescador 08/03/2018, 11:58 AM

## 2018-08-04 ENCOUNTER — Inpatient Hospital Stay (HOSPITAL_COMMUNITY): Payer: Medicare HMO

## 2018-08-04 DIAGNOSIS — T69012D Immersion hand, left hand, subsequent encounter: Secondary | ICD-10-CM

## 2018-08-04 LAB — RENAL FUNCTION PANEL
Albumin: 2.4 g/dL — ABNORMAL LOW (ref 3.5–5.0)
Anion gap: 8 (ref 5–15)
BUN: 20 mg/dL (ref 8–23)
CALCIUM: 7.2 mg/dL — AB (ref 8.9–10.3)
CO2: 17 mmol/L — AB (ref 22–32)
CREATININE: 0.59 mg/dL (ref 0.44–1.00)
Chloride: 112 mmol/L — ABNORMAL HIGH (ref 98–111)
Glucose, Bld: 190 mg/dL — ABNORMAL HIGH (ref 70–99)
Phosphorus: <1 mg/dL — CL (ref 2.5–4.6)
Potassium: 4.3 mmol/L (ref 3.5–5.1)
SODIUM: 137 mmol/L (ref 135–145)

## 2018-08-04 LAB — GLUCOSE, CAPILLARY
GLUCOSE-CAPILLARY: 155 mg/dL — AB (ref 70–99)
GLUCOSE-CAPILLARY: 166 mg/dL — AB (ref 70–99)
Glucose-Capillary: 148 mg/dL — ABNORMAL HIGH (ref 70–99)
Glucose-Capillary: 156 mg/dL — ABNORMAL HIGH (ref 70–99)
Glucose-Capillary: 173 mg/dL — ABNORMAL HIGH (ref 70–99)
Glucose-Capillary: 176 mg/dL — ABNORMAL HIGH (ref 70–99)
Glucose-Capillary: 205 mg/dL — ABNORMAL HIGH (ref 70–99)

## 2018-08-04 LAB — BASIC METABOLIC PANEL
Anion gap: 12 (ref 5–15)
BUN: 27 mg/dL — ABNORMAL HIGH (ref 8–23)
CALCIUM: 7.2 mg/dL — AB (ref 8.9–10.3)
CO2: 19 mmol/L — AB (ref 22–32)
Chloride: 105 mmol/L (ref 98–111)
Creatinine, Ser: 0.71 mg/dL (ref 0.44–1.00)
GFR calc Af Amer: >60 mL/min (ref >60)
GFR calc non Af Amer: >60 mL/min (ref >60)
GLUCOSE: 201 mg/dL — AB (ref 70–99)
Potassium: 3.2 mmol/L — ABNORMAL LOW (ref 3.5–5.1)
Sodium: 136 mmol/L (ref 135–145)

## 2018-08-04 LAB — CBC
HCT: 35 % — ABNORMAL LOW (ref 36.0–46.0)
Hemoglobin: 10.7 g/dL — ABNORMAL LOW (ref 12.0–15.0)
MCH: 26.7 pg (ref 26.0–34.0)
MCHC: 30.6 g/dL (ref 30.0–36.0)
MCV: 87.3 fL (ref 80.0–100.0)
NRBC: 0 % (ref 0.0–0.2)
PLATELETS: 285 10*3/uL (ref 150–400)
RBC: 4.01 MIL/uL (ref 3.87–5.11)
RDW: 15.6 % — AB (ref 11.5–15.5)
WBC: 18.7 10*3/uL — ABNORMAL HIGH (ref 4.0–10.5)

## 2018-08-04 LAB — PHOSPHORUS: PHOSPHORUS: 3.5 mg/dL (ref 2.5–4.6)

## 2018-08-04 LAB — HSV DNA BY PCR (REFERENCE LAB)

## 2018-08-04 LAB — MAGNESIUM
Magnesium: 2.2 mg/dL (ref 1.7–2.4)
Magnesium: 2 mg/dL (ref 1.7–2.4)

## 2018-08-04 LAB — PATHOLOGIST SMEAR REVIEW

## 2018-08-04 LAB — HERPES SIMPLEX VIRUS(HSV) DNA BY PCR
HSV 1 DNA: NEGATIVE
HSV 2 DNA: NEGATIVE

## 2018-08-04 LAB — POTASSIUM: POTASSIUM: 3.8 mmol/L (ref 3.5–5.1)

## 2018-08-04 MED ORDER — ADULT MULTIVITAMIN LIQUID CH
15.0000 mL | Freq: Every day | ORAL | Status: DC
  Administered 2018-08-04 – 2018-08-06 (×3): 15 mL
  Filled 2018-08-04 (×3): qty 15

## 2018-08-04 MED ORDER — FUROSEMIDE 10 MG/ML IJ SOLN
80.0000 mg | Freq: Once | INTRAMUSCULAR | Status: AC
  Administered 2018-08-04: 80 mg via INTRAVENOUS
  Filled 2018-08-04: qty 8

## 2018-08-04 MED ORDER — AMIODARONE IV BOLUS ONLY 150 MG/100ML
150.0000 mg | Freq: Once | INTRAVENOUS | Status: AC
  Administered 2018-08-04: 150 mg via INTRAVENOUS
  Filled 2018-08-04: qty 100

## 2018-08-04 MED ORDER — ALBUMIN HUMAN 25 % IV SOLN
12.5000 g | Freq: Once | INTRAVENOUS | Status: AC
  Administered 2018-08-04: 12.5 g via INTRAVENOUS
  Filled 2018-08-04: qty 50

## 2018-08-04 MED ORDER — METOPROLOL TARTRATE 5 MG/5ML IV SOLN
5.0000 mg | Freq: Four times a day (QID) | INTRAVENOUS | Status: DC
  Administered 2018-08-04: 5 mg via INTRAVENOUS

## 2018-08-04 MED ORDER — AMIODARONE LOAD VIA INFUSION
150.0000 mg | Freq: Once | INTRAVENOUS | Status: AC
  Administered 2018-08-04: 150 mg via INTRAVENOUS
  Filled 2018-08-04: qty 83.34

## 2018-08-04 MED ORDER — AMIODARONE HCL IN DEXTROSE 360-4.14 MG/200ML-% IV SOLN
30.0000 mg/h | INTRAVENOUS | Status: DC
  Filled 2018-08-04 (×2): qty 200

## 2018-08-04 MED ORDER — POTASSIUM PHOSPHATES 15 MMOLE/5ML IV SOLN
30.0000 mmol | Freq: Once | INTRAVENOUS | Status: AC
  Administered 2018-08-04: 30 mmol via INTRAVENOUS
  Filled 2018-08-04: qty 10

## 2018-08-04 MED ORDER — FUROSEMIDE 10 MG/ML IJ SOLN
40.0000 mg | Freq: Once | INTRAMUSCULAR | Status: AC
  Administered 2018-08-04: 40 mg via INTRAVENOUS
  Filled 2018-08-04: qty 4

## 2018-08-04 MED ORDER — AMIODARONE HCL IN DEXTROSE 360-4.14 MG/200ML-% IV SOLN
60.0000 mg/h | INTRAVENOUS | Status: AC
  Administered 2018-08-04 – 2018-08-05 (×2): 60 mg/h via INTRAVENOUS
  Filled 2018-08-04: qty 200

## 2018-08-04 MED ORDER — FENTANYL 2500MCG IN NS 250ML (10MCG/ML) PREMIX INFUSION
0.0000 ug/h | INTRAVENOUS | Status: DC
  Administered 2018-08-04: 25 ug/h via INTRAVENOUS
  Administered 2018-08-05: 50 ug/h via INTRAVENOUS
  Filled 2018-08-04 (×2): qty 250

## 2018-08-04 MED ORDER — AMIODARONE HCL IN DEXTROSE 360-4.14 MG/200ML-% IV SOLN: 30.0000 mg/h | INTRAVENOUS | Status: DC

## 2018-08-04 MED ORDER — VITAL AF 1.2 CAL PO LIQD
1000.0000 mL | ORAL | Status: DC
  Administered 2018-08-04 – 2018-08-06 (×3): 1000 mL

## 2018-08-04 MED ORDER — ACETAMINOPHEN 160 MG/5ML PO SOLN
650.0000 mg | ORAL | Status: DC | PRN
  Administered 2018-08-04 – 2018-08-06 (×4): 650 mg
  Filled 2018-08-04 (×4): qty 20.3

## 2018-08-04 MED ORDER — AMIODARONE HCL IN DEXTROSE 360-4.14 MG/200ML-% IV SOLN
60.0000 mg/h | INTRAVENOUS | Status: AC
  Administered 2018-08-04 (×2): 60 mg/h via INTRAVENOUS
  Filled 2018-08-04: qty 200

## 2018-08-04 MED ORDER — METOPROLOL TARTRATE 5 MG/5ML IV SOLN: 5.0000 mg | Freq: Once | INTRAVENOUS | Status: DC

## 2018-08-04 MED ORDER — AMIODARONE HCL IN DEXTROSE 360-4.14 MG/200ML-% IV SOLN
30.0000 mg/h | INTRAVENOUS | Status: DC
  Administered 2018-08-04: 30 mg/h via INTRAVENOUS
  Filled 2018-08-04 (×2): qty 200

## 2018-08-04 MED ORDER — METOPROLOL TARTRATE 5 MG/5ML IV SOLN
INTRAVENOUS | Status: AC
  Administered 2018-08-04: 5 mg via INTRAVENOUS
  Filled 2018-08-04: qty 5

## 2018-08-04 MED ORDER — AMIODARONE HCL IN DEXTROSE 360-4.14 MG/200ML-% IV SOLN
60.0000 mg/h | INTRAVENOUS | Status: AC
  Administered 2018-08-04: 60 mg/h via INTRAVENOUS

## 2018-08-04 NOTE — Progress Notes (Signed)
OT Cancellation Note  Patient Details Name: Victoria Joseph MRN: [id number] DOB: [birth year]   Cancelled Treatment:    Reason Eval/Treat Not Completed: Medical issues which prohibited therapy   Jeani HawkingWendi [place] Mckeough, OTR/L Acute Rehabilitation Services Pager [phone] Office [phone]   Jeani HawkingConarpe, Rogerio Boutelle M 08/04/2018, 5:39 AM

## 2018-08-04 NOTE — Progress Notes (Signed)
STROKE TEAM PROGRESS NOTE   INTERVAL HISTORY Her son and daughter / dtr-in law are at the bedside.   She  developed increased work of breathing and pulmonary edema yesterday requiring intubation and sedation.  Vitals:   08/04/18 1645 08/04/18 1700 08/04/18 1730 08/04/18 1800  BP:      Pulse:    (!) 142  Resp: 18 18 18 17   Temp: 99.5 F (37.5 C) 99.9 F (37.7 C) 100 F (37.8 C) 100.2 F (37.9 C)  TempSrc:      SpO2: 96% 99%  100%  Weight:      Height:        CBC:  Recent Labs  Lab 07/22/2018 1446  08/03/18 0501 08/04/18 0513  WBC 11.4*  --  21.4* 18.7*  NEUTROABS 8.2*  --   --   --   HGB 12.4   < > 12.9 10.7*  HCT 40.9   < > 38.5 35.0*  MCV 88.1  --  83.3 87.3  PLT 367  --  418* 285   < > = values in this interval not displayed.    Basic Metabolic Panel:  Recent Labs  Lab 08/03/18 0501  08/04/18 0513 08/04/18 1611  NA 135  --  137  --   K 2.3*   < > 4.3 3.8  CL 106  --  112*  --   CO2 18*  --  17*  --   GLUCOSE 195*  --  190*  --   BUN 6*  --  20  --   CREATININE 0.57  --  0.59  --   CALCIUM 7.4*  --  7.2*  --   MG 1.4*   < > 2.2 2.0  PHOS  --    < > <1.0* 3.5   < > = values in this interval not displayed.   Lipid Panel:     Component Value Date/Time   CHOL 157 08/02/2018 0438   TRIG 83 08/02/2018 0438   HDL 64 08/02/2018 0438   CHOLHDL 2.5 08/02/2018 0438   VLDL 17 08/02/2018 0438   LDLCALC 76 08/02/2018 0438   HgbA1c:  Lab Results  Component Value Date   HGBA1C 5.6 08/02/2018   Urine Drug Screen:     Component Value Date/Time   LABOPIA NONE DETECTED 08/10/2018 2110   COCAINSCRNUR NONE DETECTED 08/02/2018 2110   LABBENZ NONE DETECTED 08/06/2018 2110   AMPHETMU NONE DETECTED 08/08/2018 2110   THCU NONE DETECTED 07/31/2018 2110   LABBARB NONE DETECTED 08/13/2018 2110    Alcohol Level     Component Value Date/Time   ETH <10 08/15/2018 1515    IMAGING Dg Chest Port 1 View  Result Date: 08/03/2018 CLINICAL DATA:  Endotracheal tube  placement EXAM: PORTABLE CHEST 1 VIEW COMPARISON:  08/02/2018 FINDINGS: Endotracheal tube with the tip 2.2 cm above the carina. Nasogastric tube with the tip projecting over the stomach. Right-sided PICC line with the tip projecting over the SVC. Bilateral diffuse interstitial thickening. No pneumothorax. Possible trace right pleural effusion. Stable cardiomediastinal silhouette. No acute osseous abnormality. IMPRESSION: 1. Support lines and tubing in satisfactory position. 2. Bilateral diffuse mild interstitial thickening which may reflect pulmonary edema versus multilobar pneumonia. Electronically Signed   By: Elige KoHetal  Patel   On: 08/03/2018 13:04   2D echocardiogram Diminished ejection fraction 30-35% no thrombus. Anterior, anterior septal, apical and inferior pico severe hypokinesis  EEG This EEG is characterized by slowing which is consistent with normal drowse.  Can not  rule out the possibility of slowing related to general cerebral disturbance such as a metabolic encephalopathy.   d.     PHYSICAL EXAM Frail elderly Caucasian lady who is sedated. . Afebrile. Head is nontraumatic. Neck is supple without bruit.    Cardiac exam no murmur or gallop. Lungs are clear to auscultation. Distal pulses are well felt. Neurological Exam :  Patient is awake and alert. She is oriented 2. Diminished attention, registration and recall. She is easily distractible. Speech appears clear without definite aphasia or dysarthria. Pupils irregular reactive. Fundi not visualized. Motor system exam she is able to move all 4 extremities well against gravity without focal weakness. Deep tendon reflexes are symmetric. Plantars are downgoing. Gait not tested. ASSESSMENT/PLAN Victoria Joseph is a 84 y.o. female with history of PVD, HLD, HTN, anxiety presenting from [facility] with altered mental status.  Progressive confusion throughout the day.  In the emergency room, felt to have a new onset stroke.   Received IV tPA 08-22-18 at 1532.  Patient with post TPA hemorrhage and very encephalopathic with temperature up to 103 with stiff neck.  Unable to do LP as post TPA.  Fibrinogen level normal.  Started on empiric meningitic antibiotic coverage.  Developed hypotension of unclear etiology.  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  Code Stroke CT head No acute stroke.  Motion degraded  CTA head & neck L ICA 20%.  R VA 30 to 50%.  Bilateral ICA bifurcation less than 30%.  CT perfusion negative  Repeat CT head post TPA shows acute subarachnoid hemorrhage overlying right frontal lobe and inferior left temporal lobe. MRI  Multifocal acute ischemia and subarachnoid hemorrhage superimposed on bilateral posterior predominant white matter hyperintensity. Together, findings are most suggestive of posterior reversible encephalopathy syndrome (PRES).Areas of subarachnoid hemorrhage in the right frontal and left temporal lobes are unchanged. There are new foci of hemorrhage in the left occipital lobe compared to the earlier head CT  2D Echo Moderately diminished ejection fraction 30-35% with normal PICA thrombus. Anterior, anterior septal, apical and inferior pico severe hypokinesis.  EEG no seizure.  Drowsy.  LDL 76  HgbA1c 5.6  SCDs for VTE prophylaxis Diet Order            Diet NPO time specified  Diet effective now              aspirin 81 mg daily prior to admission, now on No antithrombotic given post tPA hmg  Therapy recommendations:  pending   Disposition:  pending   Febrile  WBC yest 11.4  Temp 103 during the night  Started on gabciclovir, meropenem, vanc 11/12  UA no infection.  Culture no growthCXR NAD  Code sepsis 11/12 am. CCM consulted.   Blood cultures no growth x2, less than 24 hours  Check labs in  am  CSF is benign  Hypotension  Hx hypertension  ? Sepsis  Treated with IVF  BP goals in place per CCM . Aline placed 11/12 . SBP goal < 140 given acute hemorrhage . Home meds: tenormin 50 bidLong-term BP goal normotensive  Hyperlipidemia  Home meds:  lipitor 20, resumed in hospital  LDL 76, goal < 70  Continue statin at discharge  Other Stroke Risk Factors  Advanced age  PVD on pletal bid PTA  Other Active Problems  On estrace vaginal cream  Allergies on allegra and flonase PTA  Hospital day # 3    I have personally examined this patient, reviewed notes, independently viewed imaging studies, participated in medical decision making and plan of care.ROS completed by me personally and pertinent positives fully documented  I have made any additions or clarifications directly to the above note.    She presented with sudden onset of altered mental status with confusion and speech difficulties and was given IV TPA but has developed small post TPA small hemorrhages, overnight became febrile and agitated requiring sedation and is now hypotensive. She has unfortunately developed pulmonary edema and respiratory failure and is intubated and sedated. Continue supportive care for now.. Long discussion with Dr. Denese Killings. and patient's son and daughter at the bedside and answered questions.  .  This patient is critically ill and at significant risk of neurological worsening, death and care requires constant monitoring of vital signs, hemodynamics,respiratory and cardiac monitoring, extensive review of multiple databases, frequent neurological assessment, discussion with family, other specialists and medical decision making of high complexity.I have made any additions or clarifications directly to the above note.This critical care time does not reflect procedure time, or teaching time or supervisory time of PA/NP/Med Resident etc but could involve care discussion time.  I spent 35 minutes of  neurocritical care time  in the care of  this patient.     Victoria Heady, MD Medical Director [facility] Pager: [phone] 08/04/2018 6:28 PM  To contact Stroke Continuity provider, please refer to [email or web address]. After hours, contact General Neurology

## 2018-08-04 NOTE — Progress Notes (Signed)
SLP Cancellation Note  Patient Details Name: Victoria Joseph MRN: [id number] DOB: 1931-11-01   Cancelled treatment:       Reason Eval/Treat Not Completed: Patient not medically ready. Will sign off at this time   Claudine MoutonDeBlois, Helia Haese Caroline 08/04/2018, 7:50 AM

## 2018-08-04 NOTE — Progress Notes (Signed)
Pt sedated on fentanyl gtt with no movement to painful stimuli in all four extremities. Dr. Denese KillingsAgarwala aware.

## 2018-08-04 NOTE — Progress Notes (Signed)
PT Cancellation Note  Patient Details Name: Martyn Ehrichnn C Khurana MRN: [id number] DOB: 07/13/1932   Cancelled Treatment:    Reason Eval/Treat Not Completed: Medical issues which prohibited therapy.  Pt remains on strict bed rest per orders, intubated, and has a complicated R UE clot issue that is difficult to treat given her current medical status (see vascular surgery note 08/03/18).  PT will continue to hold until further medical stability.  Thanks,  Rollene Rotundaebecca B. Alexzandria Massman, PT, DPT  Acute Rehabilitation [phone] pager [phone] office     Lurena Joinerebecca B. Kaithlyn Teagle, PT, DPT  Acute Rehabilitation [phone] pager #[phone] office   08/04/2018, 10:42 AM

## 2018-08-04 NOTE — Progress Notes (Signed)
  Amiodarone Drug - Drug Interaction Consult Note  Recommendations:   Continue to monitor and replace electrolytes as needed.   Monitor QTc.  Amiodarone is metabolized by the cytochrome P450 system and therefore has the potential to cause many drug interactions. Amiodarone has an average plasma half-life of 50 days (range 20 to 100 days).   There is potential for drug interactions to occur several weeks or months after stopping treatment and the onset of drug interactions may be slow after initiating amiodarone.   [x]  Statins: Increased risk of myopathy. Simvastatin- restrict dose to 20mg  daily. Other statins: counsel patients to report any muscle pain or weakness immediately.   On Atorvastatin 20 mg daily, okay.  []  Beta blockers: increased risk of bradycardia, AV block and myocardial depression. Sotalol - avoid concomitant use.                 - on Atenolol prior to admission, not as inpatient.  [x]  Diuretics: increased risk of cardiotoxicity if hypokalemia occurs.               - Lasix 40 mg IV given x 1 on 11/14 am and 11/13 pm.                - on Chlorthalidone prior to admission, not as inpatient.  [x]  Drugs that prolong the QT interval:  Torsades de pointes risk may be increased with concurrent use - avoid if possible.  Monitor QTc, also keep magnesium/potassium WNL if concurrent therapy can't be avoided.   Marland Kitchen.  tricyclic antidepressants                  On Amitriptyline 25 mg qhs QTc 470 on 11/13 and 439 today.                   K+ was low, now 4.3 after replacing. Mag 2.2 after replacing.  Thank You,  Dennie Fettersgan, Jani Ploeger Donovan, RPh Pager: [phone] or phone: [phone] 08/04/2018 11:39 AM

## 2018-08-04 NOTE — Progress Notes (Signed)
NAME:  Victoria Joseph, MRN:  [id number], DOB:  Oct 21, 1931, LOS: 3 ADMISSION DATE:  2018/08/20, CONSULTATION DATE:  08/02/2018 REFERRING MD:  Pearlean Brownie  - Neurology, CHIEF COMPLAINT:  08/02/2018   Brief History   84 year old, normally high functioning with a history of hypertension. Presented with confusion.  MRI compatible with PRES   Initially improved from mumbling to confused but fluent speech, but required intubation 11/13 for shortness of breath from volume overload.  Difficult to assess blood pressure initially. Were able to wean pressors significantly once femoral arterial line placed. Seen by Vascular Surgery Cory Roughen) for possible ischemic left hand. Conservative management advised.  Subjective:  Remains intubated. More comfortable since switched to fentanyl infusion overnight. Episode of rapid atrial fibrillation requiring amiodarone infusion. Objective   Blood pressure (!) 81/40, pulse 83, temperature 98.8 F (37.1 C), temperature source Axillary, resp. rate 20, height 4\' 11"  (1.499 m), weight 49.8 kg, SpO2 100 %.    Vent Mode: PRVC FiO2 (%):  [40 %-100 %] 40 % Set Rate:  [18 bmp-20 bmp] 20 bmp Vt Set:  [340 mL-400 mL] 340 mL PEEP:  [5 cmH20] 5 cmH20 Plateau Pressure:  [13 cmH20-18 cmH20] 17 cmH20   Intake/Output Summary (Last 24 hours) at 08/04/2018 1121 Last data filed at 08/04/2018 1100 Gross per 24 hour  Intake 3710.8 ml  Output 2940 ml  Net 770.8 ml   Filed Weights   08/20/18 1442 08/04/18 0500  Weight: 51.3 kg 49.8 kg    Examination: General: Frail appearing woman.  HENT: ETT, OGT in place with no skin breakdown. Lungs: Clear to auscultations, crackles have resolved. No asynchrony. Cardiovascular: Extremities warm again including left hand. HS normal Abdomen: Soft and not tender, tolerating tube feeds. Extremities: No edema or active joints. Neuro: Sedated no response to voice GU: Improving urine output care.  Assessment & Plan:  Acute encephalopathy due  to PRES - precipitant unclear Critically ill due to respiratory failure with hypoxia requiring mechanical ventilation. Critically ill due to hypotension requiring titration of vasopressors to maintain MAP>65 Atrial fibrillation Pulmonary edema.  Plan Continue current level of sedation today  Diurese further Continue to titrate NE down as tolerated. Start daily WUA and SBT tomorrow Complete 24h amiodarone load/  Disposition / Summary of Today's Plan 08/04/18   Continue current ICU management. No extubation plans today  Best Practice Bundle  Nutritional status and diet: Moderate risk, on tube feeds. Pain/Anxiety/Delirium reduction: Fentanyl infusion. VAP protocol (if indicated) in place DVT prophylaxis: Lovenox [place] GI prophylaxis: Pantoprazole Hyperglycemia protocol:  Euglycemic on no therapy Mobility:Bedrest  Antibiotic de-escalation: Stopped. Code Status: Full Family Communication: Son and daughter advised at bedside 11/14  Labs and Ancillary Testing (personally reviewed)  CBC: leukocytosis with some improvement to 18.7, thrombocytopenia has resolved at 285  Chemistry: mild hyperchloremic acidosis. Creatinine 0.56  ABG normal ABG  Coagulation Profile: INR 1.02  Cardiac Enzymes: negative.  CBG: acceptable control  Microbiology: blood and CSF negative.    CRITICAL CARE Performed by: Lynnell Catalan   Total critical care time: 40 minutes  Critical care time was exclusive of separately billable procedures and treating other patients.  Critical care was necessary to treat or prevent imminent or life-threatening deterioration.  Critical care was time spent personally by me on the following activities: development of treatment plan with patient and/or surrogate as well as nursing, discussions with consultants, evaluation of patient's response to treatment, examination of patient, obtaining history from patient or surrogate, ordering and performing treatments and  interventions, ordering and review of laboratory studies, ordering and review of radiographic studies, pulse oximetry, re-evaluation of patient's condition, and participation in multidisciplinary rounds.   Lynnell Catalanavi Lanah Steines, MD [facility] ICU Physician [facility]  Pager: [phone] Mobile: [phone] After hours: [phone].

## 2018-08-04 NOTE — Progress Notes (Signed)
eLink Physician-Brief Progress Note Patient Name: Victoria Joseph DOB: [birth year] MRN: [id number]   Date of Service  08/04/2018  HPI/Events of Note  Afib RVR 130s, given another amiodarone bolus and maintained on drip. SBPs 90s  eICU Interventions  Will give another albumin 12.5mg      Intervention Category Major Interventions: Arrhythmia - evaluation and management  Darl Pikesmily T Daryn Hicks 08/04/2018, 9:15 PM

## 2018-08-04 NOTE — Progress Notes (Signed)
*  PRELIMINARY RESULTS* Vascular Ultrasound Upper Extremity Arterial Duplex has been completed.   The left subclavian and left axillary arteries exhibit monophasic flow, likely secondary to a long segment of narrowing without focal hemodynamically significant stenosis.   Left brachial artery is patent with monophasic flow.  Bifurcation is located just below the [facility] fossa. Left radial artery exhibits significantly dampened flow, with evidence of thrombus in the distal segment.  Left ulnar artery is patent with monophasic flow.  Unable to discern definitive left palmar arch signal.  Right subclavian artery is patent with biphasic flow.  08/04/2018 9:46 AM Gertie FeyMichelle Bertie Simien, MHA, RVT, RDCS, RDMS

## 2018-08-04 NOTE — Progress Notes (Addendum)
CRITICAL VALUE ALERT  Critical Value:  Phosphorus <1.0  Date & Time Notied:  08/04/18 16100647  Provider Notified: eLink MD  Orders Received/Actions taken: See [facility]

## 2018-08-04 NOTE — Procedures (Signed)
Arterial Catheter Insertion Procedure Note Victoria Joseph [id number] 01-05-1932  Procedure: Insertion of Arterial Catheter  Indications: Blood pressure monitoring and Frequent blood sampling  Procedure Details Consent: Risks of procedure as well as the alternatives and risks of each were explained to the (patient/caregiver).  Consent for procedure obtained. Time Out: Verified patient identification, verified procedure, site/side was marked, verified correct patient position, special equipment/implants available, medications/allergies/relevent history reviewed, required imaging and test results available.  Performed  Maximum sterile technique was used including antiseptics, cap, gloves, gown, hand hygiene, mask and sheet. Skin prep: Chlorhexidine; local anesthetic administered 20 gauge catheter was inserted into right femoral artery using the Seldinger technique. ULTRASOUND GUIDANCE USED: NO Evaluation Blood flow good; BP tracing good. Complications: No apparent complications.   Victoria Joseph Victoria Joseph 08/04/2018

## 2018-08-04 NOTE — Progress Notes (Signed)
    Subjective  -   Remains intubated and sedated Went into A. fib with RVR last night  Physical Exam:  Unchanged appearance of the left hand.  It does appear to be warmer today.  I can get a Doppler signal in the ulnar artery and the brachial signal is more prominent.       Assessment/Plan:    Left upper extremity duplex pending  Left hand is warmer and has better color today.  She now has a Doppler signal in the ulnar artery.  Continue to wean blood pressure medication.  Patient is not a candidate for IV heparin given neurologic issues.  Family updated at the bedside.  Wells Thijs Brunton 08/04/2018 9:12 AM --  Vitals:   08/04/18 0845 08/04/18 0900  BP:    Pulse: 81 79  Resp: (!) 22 20  Temp:    SpO2: 100% 100%    Intake/Output Summary (Last 24 hours) at 08/04/2018 0912 Last data filed at 08/04/2018 0800 Gross per 24 hour  Intake 3551.88 ml  Output 2560 ml  Net 991.88 ml     Laboratory CBC    Component Value Date/Time   WBC 18.7 (H) 08/04/2018 0513   HGB 10.7 (L) 08/04/2018 0513   HCT 35.0 (L) 08/04/2018 0513   PLT 285 08/04/2018 0513    BMET    Component Value Date/Time   NA 137 08/04/2018 0513   K 4.3 08/04/2018 0513   CL 112 (H) 08/04/2018 0513   CO2 17 (L) 08/04/2018 0513   GLUCOSE 190 (H) 08/04/2018 0513   BUN 20 08/04/2018 0513   CREATININE 0.59 08/04/2018 0513   CALCIUM 7.2 (L) 08/04/2018 0513   GFRNONAA >60 08/04/2018 0513   GFRAA >60 08/04/2018 0513    COAG Lab Results  Component Value Date   INR 1.02 11-16-17   INR 1.00 12/14/2009   No results found for: PTT  Antibiotics Anti-infectives (From admission, onward)   Start     Dose/Rate Route Frequency Ordered Stop   08/02/18 2230  vancomycin (VANCOCIN) IVPB 750 mg/150 ml premix  Status:  Discontinued     750 mg 150 mL/hr over 60 Minutes Intravenous Every 24 hours 12-20-2017 2126 08/03/18 1358   12-20-2017 2300  ganciclovir (CYTOVENE) 130 mg in sodium chloride 0.9 % 100 mL IVPB   Status:  Discontinued     2.5 mg/kg  51.3 kg 100 mL/hr over 60 Minutes Intravenous Every 24 hours 12-20-2017 2135 08/03/18 1358   12-20-2017 2230  vancomycin (VANCOCIN) IVPB 1000 mg/200 mL premix     1,000 mg 200 mL/hr over 60 Minutes Intravenous  Once 12-20-2017 2126 12-20-2017 2307   12-20-2017 2200  meropenem (MERREM) 2 g in sodium chloride 0.9 % 100 mL IVPB  Status:  Discontinued     2 g 200 mL/hr over 30 Minutes Intravenous Every 12 hours 12-20-2017 2121 08/03/18 1358       V. Charlena CrossWells Lanisha Stepanian IV, M.D. [facility] Office: [phone] Pager:  [phone]

## 2018-08-04 NOTE — Progress Notes (Signed)
   08/04/18 0707  Vitals  Pulse Rate (!) 178  ECG Heart Rate (!) 179  Resp 20  Oxygen Therapy  SpO2 100 %  Art Line  Arterial Line BP 88/56  Arterial Line MAP (mmHg) 69 mmHg  Provider Notification  Provider Name/Title Cyndra NumberseLink Smith, MD  Date Provider Notified 08/04/18  Time Provider Notified [phone]  Notification Type Call  Notification Reason Change in status (A- fib RVR)  Response Other (Comment)

## 2018-08-04 NOTE — Progress Notes (Signed)
Initial Nutrition Assessment  DOCUMENTATION CODES:   Not applicable  INTERVENTION:   D/C Vital High Protein D/C prostat  Vital AF 1.2 @ 40 ml/hr (960 ml/day) via OG tube Provides: 1152 kcal, 72 grams protein, and 778 ml free water.   Noted low phosphorus which is being repleated and monitored.   NUTRITION DIAGNOSIS:   Inadequate oral intake related to inability to eat as evidenced by NPO status.  GOAL:   Patient will meet greater than or equal to 90% of their needs  MONITOR:   Vent status, TF tolerance  REASON FOR ASSESSMENT:   Consult, Ventilator Enteral/tube feeding initiation and management  ASSESSMENT:   Pt with PMH of PVD, HLD, HTN, IBS, GERD and anxiety who was admitted from [facility] independent living with AMS/possible stroke s/p tPA developed hypotension, SOB due to volume overload, tx to ICU and intubated.    Pt discussed during ICU rounds and with RN.  Spoke with RN. Vital AF hanging not Vital High Protein. OG tube: Vital AF 1.2 @ 40 ml/hr with 30 ml Prostat BID Provides: 1352 kcal, 102 grams protein, and 778 ml free water.  No family currently present.   Patient is currently intubated on ventilator support MV: 6.5 L/min Temp (24hrs), Avg:98.3 F (36.8 C), Min:96.8 F (36 C), Max:99.8 F (37.7 C)  Medications reviewed and include: oscal with D x 2 Levophed @ 2 mcg 30 mmol potassium phosphate x 1 Labs reviewed: PO4: <1.0 CBG's: 166-148-205  BP: 95/45 MAP: 64   I/O: +7520 ml since admit UOP: 2535 ml x 24 hrs    NUTRITION - FOCUSED PHYSICAL EXAM:    Most Recent Value  Orbital Region  No depletion  Upper Arm Region  Mild depletion  Thoracic and Lumbar Region  No depletion  Buccal Region  No depletion  Temple Region  No depletion  Clavicle Bone Region  No depletion  Clavicle and Acromion Bone Region  No depletion  Scapular Bone Region  Unable to assess  Dorsal Hand  Moderate depletion  Patellar Region  No depletion  Anterior Thigh  Region  Mild depletion  Posterior Calf Region  Mild depletion  Edema (RD Assessment)  None  Hair  Reviewed  Eyes  Unable to assess  Mouth  Unable to assess  Skin  Reviewed [ecchymosis on hands]  Nails  Reviewed       Diet Order:   Diet Order            Diet NPO time specified  Diet effective now              EDUCATION NEEDS:   No education needs have been identified at this time  Skin:  Skin Assessment: Reviewed RN Assessment  Last BM:  11/13 small-type 5  Height:   Ht Readings from Last 1 Encounters:  08/02/2018 4\' 11"  (1.499 m)    Weight:   Wt Readings from Last 1 Encounters:  08/04/18 49.8 kg    Ideal Body Weight:  44.6 kg  BMI:  Body mass index is 22.17 kg/m.  Estimated Nutritional Needs:   Kcal:  1111  Protein:  60-75 grams  Fluid:  > 1.5 L/day  Kendell BaneHeather Asier Desroches RD, LDN, CNSC [phone] Pager [phone] After Hours Pager

## 2018-08-04 NOTE — Progress Notes (Signed)
eLink Physician-Brief Progress Note Patient Name: Victoria Joseph DOB: 21-May-1932 MRN: [id number]   Date of Service  08/04/2018  HPI/Events of Note  Phos less than 1   eICU Interventions  replaced     Intervention Category Intermediate Interventions: Electrolyte abnormality - evaluation and management  Henry RusselSMITH, Kenyonna Micek, P 08/04/2018, 6:55 AM

## 2018-08-04 NOTE — Progress Notes (Signed)
eLink Physician-Brief Progress Note Patient Name: Victoria Joseph DOB: 07-17-1932 MRN: [id number]   Date of Service  08/04/2018  HPI/Events of Note  afib RVR.  Appears to be on bblocker as outpatient and hx of arrythmia noted in H&P  eICU Interventions  Lopressor 5 mg IV every 6 hrs with hold parameters     Intervention Category Major Interventions: Arrhythmia - evaluation and management  Henry RusselSMITH, Toshiyuki Fredell, P 08/04/2018, 7:12 AM

## 2018-08-04 NOTE — Progress Notes (Signed)
   08/04/18 1630  Vitals  Temp 99.3 F (37.4 C)  ECG Heart Rate (!) 163  Resp (!) 21  Oxygen Therapy  SpO2 98 %  Art Line  Arterial Line BP 99/58  Arterial Line MAP (mmHg) 73 mmHg  Provider Notification  Provider Name/Title Dr. Denese KillingsAgarwala  Date Provider Notified 08/04/18  Time Provider Notified 1630  Notification Type Page  Notification Reason Other (Comment) (afib with RVR)  Response See new orders (re-bolus 150mg  amiodarone, restart gtt at 60mg /hr x 6 hrs)  Date of Provider Response 08/04/18  Time of Provider Response 1630

## 2018-08-05 ENCOUNTER — Inpatient Hospital Stay (HOSPITAL_COMMUNITY): Payer: Medicare HMO

## 2018-08-05 LAB — CBC
HCT: 30.2 % — ABNORMAL LOW (ref 36.0–46.0)
Hemoglobin: 9.7 g/dL — ABNORMAL LOW (ref 12.0–15.0)
MCH: 28 pg (ref 26.0–34.0)
MCHC: 32.1 g/dL (ref 30.0–36.0)
MCV: 87 fL (ref 80.0–100.0)
NRBC: 0 % (ref 0.0–0.2)
PLATELETS: 195 10*3/uL (ref 150–400)
RBC: 3.47 MIL/uL — AB (ref 3.87–5.11)
RDW: 15.9 % — ABNORMAL HIGH (ref 11.5–15.5)
WBC: 12.8 10*3/uL — ABNORMAL HIGH (ref 4.0–10.5)

## 2018-08-05 LAB — GLUCOSE, CAPILLARY
GLUCOSE-CAPILLARY: 213 mg/dL — AB (ref 70–99)
GLUCOSE-CAPILLARY: 151 mg/dL — AB (ref 70–99)
GLUCOSE-CAPILLARY: 141 mg/dL — AB (ref 70–99)
GLUCOSE-CAPILLARY: 135 mg/dL — AB (ref 70–99)
Glucose-Capillary: 180 mg/dL — ABNORMAL HIGH (ref 70–99)
Glucose-Capillary: 188 mg/dL — ABNORMAL HIGH (ref 70–99)

## 2018-08-05 LAB — BASIC METABOLIC PANEL
ANION GAP: 11 (ref 5–15)
ANION GAP: 11 (ref 5–15)
BUN: 29 mg/dL — AB (ref 8–23)
BUN: 28 mg/dL — ABNORMAL HIGH (ref 8–23)
CHLORIDE: 104 mmol/L (ref 98–111)
CHLORIDE: 100 mmol/L (ref 98–111)
CO2: 21 mmol/L — ABNORMAL LOW (ref 22–32)
CO2: 24 mmol/L (ref 22–32)
Calcium: 7.6 mg/dL — ABNORMAL LOW (ref 8.9–10.3)
Calcium: 7.7 mg/dL — ABNORMAL LOW (ref 8.9–10.3)
Creatinine, Ser: 0.8 mg/dL (ref 0.44–1.00)
Creatinine, Ser: 0.62 mg/dL (ref 0.44–1.00)
GFR calc Af Amer: >60 mL/min (ref >60)
Glucose, Bld: 219 mg/dL — ABNORMAL HIGH (ref 70–99)
Glucose, Bld: 153 mg/dL — ABNORMAL HIGH (ref 70–99)
POTASSIUM: 3.1 mmol/L — AB (ref 3.5–5.1)
POTASSIUM: 3.4 mmol/L — AB (ref 3.5–5.1)
SODIUM: 135 mmol/L (ref 135–145)
Sodium: 136 mmol/L (ref 135–145)

## 2018-08-05 LAB — MAGNESIUM: MAGNESIUM: 1.9 mg/dL (ref 1.7–2.4)

## 2018-08-05 MED ORDER — FUROSEMIDE 10 MG/ML IJ SOLN
60.0000 mg | Freq: Two times a day (BID) | INTRAMUSCULAR | Status: DC
  Administered 2018-08-05 – 2018-08-06 (×3): 60 mg via INTRAVENOUS
  Filled 2018-08-05 (×3): qty 6

## 2018-08-05 MED ORDER — DIGOXIN 0.05 MG/ML PO SOLN
0.1250 mg | Freq: Every day | ORAL | Status: DC
  Administered 2018-08-06: 0.125 mg
  Filled 2018-08-05 (×2): qty 2.5

## 2018-08-05 MED ORDER — AMIODARONE HCL IN DEXTROSE 360-4.14 MG/200ML-% IV SOLN
30.0000 mg/h | INTRAVENOUS | Status: DC
  Administered 2018-08-05 – 2018-08-06 (×2): 30 mg/h via INTRAVENOUS
  Filled 2018-08-05 (×2): qty 200

## 2018-08-05 MED ORDER — DIGOXIN 0.25 MG/ML IJ SOLN
0.2500 mg | Freq: Four times a day (QID) | INTRAMUSCULAR | Status: AC
  Administered 2018-08-05 – 2018-08-06 (×2): 0.25 mg via INTRAVENOUS
  Filled 2018-08-05 (×2): qty 2

## 2018-08-05 MED ORDER — ALBUMIN HUMAN 25 % IV SOLN
12.5000 g | Freq: Once | INTRAVENOUS | Status: AC
  Administered 2018-08-05: 12.5 g via INTRAVENOUS
  Filled 2018-08-05: qty 50

## 2018-08-05 MED ORDER — POTASSIUM CHLORIDE 20 MEQ PO PACK
40.0000 meq | PACK | Freq: Two times a day (BID) | ORAL | Status: AC
  Administered 2018-08-05 – 2018-08-06 (×3): 40 meq via ORAL
  Filled 2018-08-05 (×3): qty 2

## 2018-08-05 MED ORDER — METOPROLOL TARTRATE 25 MG/10 ML ORAL SUSPENSION
25.0000 mg | Freq: Three times a day (TID) | ORAL | Status: DC
  Administered 2018-08-05 – 2018-08-06 (×6): 25 mg
  Filled 2018-08-05 (×6): qty 10

## 2018-08-05 MED ORDER — METOPROLOL TARTRATE 5 MG/5ML IV SOLN
2.5000 mg | Freq: Once | INTRAVENOUS | Status: AC
  Administered 2018-08-05: 2.5 mg via INTRAVENOUS

## 2018-08-05 MED ORDER — INSULIN ASPART 100 UNIT/ML ~~LOC~~ SOLN
1.0000 [IU] | SUBCUTANEOUS | Status: DC
  Administered 2018-08-05: 3 [IU] via SUBCUTANEOUS
  Administered 2018-08-05: 1 [IU] via SUBCUTANEOUS
  Administered 2018-08-06: 2 [IU] via SUBCUTANEOUS
  Administered 2018-08-06 (×2): 1 [IU] via SUBCUTANEOUS
  Administered 2018-08-06 (×2): 2 [IU] via SUBCUTANEOUS
  Administered 2018-08-06: 1 [IU] via SUBCUTANEOUS
  Administered 2018-08-07: 2 [IU] via SUBCUTANEOUS
  Administered 2018-08-07: 1 [IU] via SUBCUTANEOUS

## 2018-08-05 MED ORDER — METOPROLOL TARTRATE 5 MG/5ML IV SOLN
INTRAVENOUS | Status: AC
  Filled 2018-08-05: qty 5

## 2018-08-05 MED ORDER — MAGNESIUM OXIDE 400 (241.3 MG) MG PO TABS
400.0000 mg | ORAL_TABLET | Freq: Every day | ORAL | Status: DC
  Administered 2018-08-05 – 2018-08-06 (×2): 400 mg via ORAL
  Filled 2018-08-05 (×2): qty 1

## 2018-08-05 MED ORDER — DIGOXIN 0.25 MG/ML IJ SOLN
0.5000 mg | Freq: Once | INTRAMUSCULAR | Status: AC
  Administered 2018-08-05: 0.5 mg via INTRAVENOUS
  Filled 2018-08-05: qty 2

## 2018-08-05 NOTE — Progress Notes (Addendum)
  Progress Note    08/05/2018 9:35 AM  Subjective:  Patient remains intubated and sedated   Vitals:   08/05/18 0800 08/05/18 0921  BP:  123/66  Pulse: (!) 133   Resp: (!) 27   Temp: 99.3 F (37.4 C)   SpO2: 100% 100%   Physical Exam: Lungs:  Mechanical ventilation Extremities:  Brisk L ulnar and palmar arch signal by doppler; hand is warm to touch with good cap refill; no doppler signal L radial  Abdomen:  Soft Neurologic: sedated  CBC    Component Value Date/Time   WBC 12.8 (H) 08/05/2018 0904   RBC 3.47 (L) 08/05/2018 0904   HGB 9.7 (L) 08/05/2018 0904   HCT 30.2 (L) 08/05/2018 0904   PLT 195 08/05/2018 0904   MCV 87.0 08/05/2018 0904   MCH 28.0 08/05/2018 0904   MCHC 32.1 08/05/2018 0904   RDW 15.9 (H) 08/05/2018 0904   LYMPHSABS 2.0 2018/08/21 1446   MONOABS 0.9 2018/08/21 1446   EOSABS 0.3 2018/08/21 1446   BASOSABS 0.0 2018/08/21 1446    BMET    Component Value Date/Time   NA 136 08/04/2018 1943   K 3.2 (L) 08/04/2018 1943   CL 105 08/04/2018 1943   CO2 19 (L) 08/04/2018 1943   GLUCOSE 201 (H) 08/04/2018 1943   BUN 27 (H) 08/04/2018 1943   CREATININE 0.71 08/04/2018 1943   CALCIUM 7.2 (L) 08/04/2018 1943   GFRNONAA >60 08/04/2018 1943   GFRAA >60 08/04/2018 1943    INR    Component Value Date/Time   INR 1.02 2018/08/21 1446     Intake/Output Summary (Last 24 hours) at 08/05/2018 0935 Last data filed at 08/05/2018 0800 Gross per 24 hour  Intake 2911.12 ml  Output 1615 ml  Net 1296.12 ml     Assessment/Plan:  84 y.o. female with possible ischemic L hand  - LUE arterial duplex demonstrates monophasic flow in subclavian through ulnar artery with thrombosed L radial artery  - Hand continues to improve with brisk L ulnar and palmer arch signal by doppler today; warm to touch - L hand is clinically viable; no indication for radial exploration given overall clinical picture - Dr. Darrick PennaFields will evaluate the patient later today   Victoria RutterMatthew  Eveland, PA-C [facility] [phone] 08/05/2018 9:35 AM  Agree with above.  Hands pink warm symmetrically No vascular surgery intevention at this point Try to avoid pressors aline Will sign off  [name], MD [facility] [place] Office: [phone] Pager: [phone]

## 2018-08-05 NOTE — Progress Notes (Signed)
Patient transported to CT and back to 4N32 without any apparent complications.

## 2018-08-05 NOTE — Progress Notes (Signed)
STROKE TEAM PROGRESS NOTE   INTERVAL HISTORY Her son and daughter / dtr-in law are at the bedside.   She  She developed atrial fibrillation with rapid heart rate and is presently on amiodarone drip. Remains in heart failure.  Vitals:   08/05/18 0630 08/05/18 0645 08/05/18 0700 08/05/18 0715  BP:      Pulse: (!) 134 (!) 130 (!) 131 (!) 129  Resp: (!) 22 20 (!) 24 20  Temp: 99.1 F (37.3 C) 99.1 F (37.3 C) 99.1 F (37.3 C) 99.1 F (37.3 C)  TempSrc:      SpO2: 100% 100% 100% 100%  Weight:      Height:        CBC:  Recent Labs  Lab 08-06-2018 1446  08/03/18 0501 08/04/18 0513  WBC 11.4*  --  21.4* 18.7*  NEUTROABS 8.2*  --   --   --   HGB 12.4   < > 12.9 10.7*  HCT 40.9   < > 38.5 35.0*  MCV 88.1  --  83.3 87.3  PLT 367  --  418* 285   < > = values in this interval not displayed.    Basic Metabolic Panel:  Recent Labs  Lab 08/04/18 0513 08/04/18 1611 08/04/18 1943  NA 137  --  136  K 4.3 3.8 3.2*  CL 112*  --  105  CO2 17*  --  19*  GLUCOSE 190*  --  201*  BUN 20  --  27*  CREATININE 0.59  --  0.71  CALCIUM 7.2*  --  7.2*  MG 2.2 2.0  --   PHOS <1.0* 3.5  --    Lipid Panel:     Component Value Date/Time   CHOL 157 08/02/2018 0438   TRIG 83 08/02/2018 0438   HDL 64 08/02/2018 0438   CHOLHDL 2.5 08/02/2018 0438   VLDL 17 08/02/2018 0438   LDLCALC 76 08/02/2018 0438   HgbA1c:  Lab Results  Component Value Date   HGBA1C 5.6 08/02/2018   Urine Drug Screen:     Component Value Date/Time   LABOPIA NONE DETECTED 02/20/18 2110   COCAINSCRNUR NONE DETECTED 02/20/18 2110   LABBENZ NONE DETECTED 02/20/18 2110   AMPHETMU NONE DETECTED 02/20/18 2110   THCU NONE DETECTED 02/20/18 2110   LABBARB NONE DETECTED 02/20/18 2110    Alcohol Level     Component Value Date/Time   ETH <10 02/20/18 1515    IMAGING Dg Chest Port 1 View  Result Date: 08/03/2018 CLINICAL DATA:  Endotracheal tube placement EXAM: PORTABLE CHEST 1 VIEW COMPARISON:   08/02/2018 FINDINGS: Endotracheal tube with the tip 2.2 cm above the carina. Nasogastric tube with the tip projecting over the stomach. Right-sided PICC line with the tip projecting over the SVC. Bilateral diffuse interstitial thickening. No pneumothorax. Possible trace right pleural effusion. Stable cardiomediastinal silhouette. No acute osseous abnormality. IMPRESSION: 1. Support lines and tubing in satisfactory position. 2. Bilateral diffuse mild interstitial thickening which may reflect pulmonary edema versus multilobar pneumonia. Electronically Signed   By: Elige KoHetal  Patel   On: 08/03/2018 13:04   2D echocardiogram Diminished ejection fraction 30-35% no thrombus. Anterior, anterior septal, apical and inferior pico severe hypokinesis  EEG This EEG is characterized by slowing which is consistent with normal drowse.  Can not rule out the possibility of slowing related to general cerebral disturbance such as a metabolic encephalopathy.   d.     PHYSICAL EXAM Frail elderly Caucasian lady who is sedated and  intubated. . Afebrile. Head is nontraumatic. Neck is supple without bruit.    Cardiac exam no murmur or gallop. Lungs are clear to auscultation. Distal pulses are well felt. Neurological Exam :  Patient is sedated and intubated. She opens eyes partially to sternal rub but does not follow any commands. She does not blink to threat bilaterally.. Pupils irregular reactive. Fundi not visualized. She does not have any spontaneous extremity movements and is only trace withdrawal to painful stimuli in the lower extremities and none in the upper extremities.. Deep tendon reflexes are symmetric. Plantars are downgoing. Gait not tested. ASSESSMENT/PLAN [name] is a 84 y.o. female with history of PVD, HLD, HTN, anxiety presenting from [facility] with altered mental status.  Progressive confusion throughout the day.  In the emergency room, felt to have a new onset stroke.  Received IV  tPA 08/02/2018 at 1532.  Patient with post TPA hemorrhage and very encephalopathic with temperature up to 103 with stiff neck.  Unable to do LP as post TPA.  Fibrinogen level normal.  Started on empiric meningitic antibiotic coverage.  Developed hypotension of unclear etiology.  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  Code Stroke CT head No acute stroke.  Motion degraded  CTA head & neck L ICA 20%.  R VA 30 to 50%.  Bilateral ICA bifurcation less than 30%.  CT perfusion negative  Repeat CT head post TPA shows acute subarachnoid hemorrhage overlying right frontal lobe and inferior left temporal lobe. MRI  Multifocal acute ischemia and subarachnoid hemorrhage superimposed on bilateral posterior predominant white matter hyperintensity. Together, findings are most suggestive of posterior reversible encephalopathy syndrome (PRES).Areas of subarachnoid hemorrhage in the right frontal and left temporal lobes are unchanged. There are new foci of hemorrhage in the left occipital lobe compared to the earlier head CT  2D Echo Moderately diminished ejection fraction 30-35% with normal PICA thrombus. Anterior, anterior septal, apical and inferior pico severe hypokinesis.  EEG no seizure.  Drowsy.  LDL 76  HgbA1c 5.6  SCDs for VTE prophylaxis Diet Order            Diet NPO time specified  Diet effective now              aspirin 81 mg daily prior to admission, now on No antithrombotic given post tPA hmg  Therapy recommendations:  pending   Disposition:  pending   Febrile  WBC yest 11.4  Temp 103 during the night  Started on gabciclovir, meropenem, vanc 11/12  UA no infection.  Culture no growthCXR NAD  Code sepsis 11/12 am. CCM consulted.   Blood cultures no growth x2, less than 24 hours  Check labs in am  CSF is  benign  Hypotension  Hx hypertension  ? Sepsis  Treated with IVF  BP goals in place per CCM . Aline placed 11/12 . SBP goal < 140 given acute hemorrhage . Home meds: tenormin 50 bidLong-term BP goal normotensive  Hyperlipidemia  Home meds:  lipitor 20, resumed in hospital  LDL 76, goal < 70  Continue statin at discharge  Other Stroke Risk Factors  Advanced age  PVD on pletal bid PTA  Other Active Problems  On estrace vaginal cream  Allergies on allegra and flonase PTA  Hospital day # 4      She presented with sudden onset of  altered mental status with confusion and speech difficulties and was given IV TPA but has developed small post TPA small hemorrhages, overnight became febrile and agitated requiring sedation and is now hypotensive. She has unfortunately developed pulmonary edema and respiratory failure and is intubated and sedated.She is also to left atrial fibrillation with rapid heart rate Continue supportive care for now.. Long discussion with Dr. Denese Killings. and patient's son and daughter at the bedside and answered questions.  .  This patient is critically ill and at significant risk of neurological worsening, death and care requires constant monitoring of vital signs, hemodynamics,respiratory and cardiac monitoring, extensive review of multiple databases, frequent neurological assessment, discussion with family, other specialists and medical decision making of high complexity.I have made any additions or clarifications directly to the above note.This critical care time does not reflect procedure time, or teaching time or supervisory time of PA/NP/Med Resident etc but could involve care discussion time.  I spent 32 minutes of neurocritical care time  in the care of  this patient.      To contact Stroke Continuity provider, please refer to [email or web address]. After hours, contact General Neurology

## 2018-08-05 NOTE — Progress Notes (Signed)
Back in AFib

## 2018-08-05 NOTE — Progress Notes (Addendum)
amio order expired, HR in 130s. No labs ordered this am. MD notified, orders received. Ordered to continue amio gtt at current rate (60mg /hr) until MD rounds.

## 2018-08-05 NOTE — Progress Notes (Signed)
NAME:  Victoria Joseph, MRN:  [id number], DOB:  [birth year], LOS: 4 ADMISSION DATE:  08/06/2018, CONSULTATION DATE:  08/02/2018 REFERRING MD:  Pearlean Brownie  - Neurology, CHIEF COMPLAINT:  08/02/2018   Brief History   84 year old, normally high functioning with a history of hypertension. Presented with confusion.  MRI compatible with PRES   Initially improved from mumbling to confused but fluent speech, but required intubation 11/13 for shortness of breath from volume overload.  Difficult to assess blood pressure initially. Were able to wean pressors significantly once femoral arterial line placed. Seen by Vascular Surgery Cory Roughen) for possible ischemic left hand. Conservative management advised. Recurrent Afib with RVR requiring amiodarone infusion.  Echocardiogram shows cardiomyopathy with EF of 30-35%  Subjective:  Remains intubated. More comfortable since switched to fentanyl infusion overnight. Episode of rapid atrial fibrillation requiring amiodarone infusion. On sedation interruption this morning the patient was able to follow commands Objective   Blood pressure 123/66, pulse (!) 133, temperature 99.3 F (37.4 C), resp. rate (!) 27, height 4\' 11"  (1.499 m), weight 48.2 kg, SpO2 100 %.    Vent Mode: PSV;CPAP FiO2 (%):  [40 %] 40 % Set Rate:  [20 bmp] 20 bmp Vt Set:  [340 mL] 340 mL PEEP:  [5 cmH20] 5 cmH20 Pressure Support:  [5 cmH20] 5 cmH20 Plateau Pressure:  [7 cmH20-16 cmH20] 12 cmH20   Intake/Output Summary (Last 24 hours) at 08/05/2018 1118 Last data filed at 08/05/2018 1047 Gross per 24 hour  Intake 2394.24 ml  Output 1235 ml  Net 1159.24 ml   Filed Weights   08/03/2018 1442 08/04/18 0500 08/05/18 0500  Weight: 51.3 kg 49.8 kg 48.2 kg    Examination: General: Frail appearing woman.  HENT: ETT, OGT in place with no skin breakdown. Lungs: Crackles anteriorly. No asynchrony. Cardiovascular: Extremities warm again including left hand. HS normal Abdomen: Soft and not  tender, tolerating tube feeds. Extremities: No edema or active joints. Neuro: Sedated, eyes flicker to voice. GU: marginal urine output.  Assessment & Plan:  Acute encephalopathy due to PRES - precipitant unclear, likely hypotension. Critically ill due to respiratory failure with hypoxia requiring mechanical ventilation. Secondary to pulmonary edema. Critically ill due to atrial fibrillation with poor ventricular rate control which is likely contributing to volume overload.  Plan Continue current level of sedation today  Diurese further Correct hypokalemia/hypomagnesemia Add digoxin  Add metoprolol Wean off iv amiodarone once HR consistently <110. Start daily WUA and SBT tomorrow For CT scan of head today per neurology.  Disposition / Summary of Today's Plan 08/05/18   Continue current ICU management. No extubation plans today. Optimize Afib and diuresis.  Best Practice Bundle  Nutritional status and diet: Moderate risk, on tube feeds. Pain/Anxiety/Delirium reduction: Fentanyl infusion. VAP protocol (if indicated) in place DVT prophylaxis: Lovenox [place] GI prophylaxis: Pantoprazole Hyperglycemia protocol:  Euglycemic on no therapy Mobility:Bedrest  Antibiotic de-escalation: Stopped. Code Status: Full Family Communication: Son and daughter advised at bedside 11/14  Labs and Ancillary Testing (personally reviewed)  CBC: leukocytosis with some improvement to 18.7, thrombocytopenia has resolved at 285  Chemistry: mild hyperchloremic acidosis. Creatinine 0.56  ABG normal ABG  Coagulation Profile: INR 1.02  Cardiac Enzymes: negative.  CBG: acceptable control  Microbiology: blood and CSF negative.  CRITICAL CARE Performed by: Lynnell Catalan   Total critical care time: 45 minutes  Critical care time was exclusive of separately billable procedures and treating other patients.  Critical care was necessary to treat or prevent imminent or  life-threatening  deterioration.  Critical care was time spent personally by me on the following activities: development of treatment plan with patient and/or surrogate as well as nursing, discussions with consultants, evaluation of patient's response to treatment, examination of patient, obtaining history from patient or surrogate, ordering and performing treatments and interventions, ordering and review of laboratory studies, ordering and review of radiographic studies, pulse oximetry, re-evaluation of patient's condition, and participation in multidisciplinary rounds.   Lynnell Catalanavi Rella Egelston, MD [facility] ICU Physician [facility]  Pager: [phone] Mobile: [phone] After hours: [phone].

## 2018-08-05 NOTE — Progress Notes (Signed)
PT Cancellation Note/discharge  Patient Details Name: Victoria Joseph MRN: [id number] DOB: December 28, 1931   Cancelled Treatment:    Reason Eval/Treat Not Completed: Medical issues which prohibited therapy(pt remains on vent and bedrest. Will sign off and await new order)   Ela Moffat B Nhyla Nappi 08/05/2018, 6:53 AM  Delaney MeigsMaija Tabor Cambren Helm, PT Acute Rehabilitation Services Pager: [phone] Office: [phone]

## 2018-08-05 NOTE — Progress Notes (Signed)
OT Cancellation Note and Discharge  Patient Details Name: Victoria Joseph MRN: [id number] DOB: 12/12/1931   Cancelled Treatment:    Reason Eval/Treat Not Completed: Patient not medically ready (pt remains on vent and bedrest. Will sign off and await new order)  Ignacia Palmaathy Emmerich Cryer, OTR/L Acute Rehab Services Pager [phone] Office [phone]     Evette GeorgesLeonard, Sebastain Fishbaugh Eva 08/05/2018, 7:05 AM

## 2018-08-05 NOTE — Progress Notes (Signed)
Currently in NSR.   

## 2018-08-06 ENCOUNTER — Inpatient Hospital Stay (HOSPITAL_COMMUNITY): Payer: Medicare HMO

## 2018-08-06 ENCOUNTER — Other Ambulatory Visit: Payer: Self-pay

## 2018-08-06 DIAGNOSIS — I480 Paroxysmal atrial fibrillation: Secondary | ICD-10-CM

## 2018-08-06 DIAGNOSIS — I4819 Other persistent atrial fibrillation: Secondary | ICD-10-CM

## 2018-08-06 DIAGNOSIS — Z01818 Encounter for other preprocedural examination: Secondary | ICD-10-CM

## 2018-08-06 DIAGNOSIS — I609 Nontraumatic subarachnoid hemorrhage, unspecified: Secondary | ICD-10-CM

## 2018-08-06 DIAGNOSIS — D72829 Elevated white blood cell count, unspecified: Secondary | ICD-10-CM

## 2018-08-06 DIAGNOSIS — I6319 Cerebral infarction due to embolism of other precerebral artery: Secondary | ICD-10-CM

## 2018-08-06 DIAGNOSIS — I6783 Posterior reversible encephalopathy syndrome: Secondary | ICD-10-CM

## 2018-08-06 DIAGNOSIS — I4891 Unspecified atrial fibrillation: Secondary | ICD-10-CM

## 2018-08-06 DIAGNOSIS — J9601 Acute respiratory failure with hypoxia: Secondary | ICD-10-CM

## 2018-08-06 LAB — CSF CULTURE W GRAM STAIN: Culture: NO GROWTH

## 2018-08-06 LAB — GLUCOSE, CAPILLARY
GLUCOSE-CAPILLARY: 153 mg/dL — AB (ref 70–99)
GLUCOSE-CAPILLARY: 160 mg/dL — AB (ref 70–99)
GLUCOSE-CAPILLARY: 141 mg/dL — AB (ref 70–99)
Glucose-Capillary: 140 mg/dL — ABNORMAL HIGH (ref 70–99)
Glucose-Capillary: 172 mg/dL — ABNORMAL HIGH (ref 70–99)

## 2018-08-06 LAB — CULTURE, BLOOD (ROUTINE X 2)
CULTURE: NO GROWTH
Culture: NO GROWTH
SPECIAL REQUESTS: ADEQUATE
SPECIAL REQUESTS: ADEQUATE

## 2018-08-06 MED ORDER — AMIODARONE HCL IN DEXTROSE 360-4.14 MG/200ML-% IV SOLN
30.0000 mg/h | INTRAVENOUS | Status: DC
  Administered 2018-08-06 – 2018-08-07 (×2): 30 mg/h via INTRAVENOUS
  Filled 2018-08-06 (×2): qty 200

## 2018-08-06 MED ORDER — FUROSEMIDE 10 MG/ML IJ SOLN: 40.0000 mg | Freq: Two times a day (BID) | INTRAMUSCULAR | Status: DC

## 2018-08-06 MED ORDER — METOPROLOL TARTRATE 5 MG/5ML IV SOLN: 2.5000 mg | Freq: Four times a day (QID) | INTRAVENOUS | Status: DC | PRN

## 2018-08-06 MED ORDER — AMIODARONE HCL IN DEXTROSE 360-4.14 MG/200ML-% IV SOLN
60.0000 mg/h | INTRAVENOUS | Status: AC
  Administered 2018-08-06: 60 mg/h via INTRAVENOUS

## 2018-08-06 NOTE — Progress Notes (Deleted)
eLink Physician-Brief Progress Note Patient Name: Victoria Joseph DOB: [birth year] MRN: [id number]   Date of Service  08/06/2018  HPI/Events of Note  Oliguria - acute urinary retention. Open wounds on upper thigh.   eICU Interventions  Will order Foley Catheter placed.      Intervention Category Intermediate Interventions: Oliguria - evaluation and management  Shatina Streets Eugene 08/06/2018, 9:11 PM

## 2018-08-06 NOTE — Progress Notes (Signed)
NAME:  Victoria Joseph, MRN:  [id number], DOB:  Mar 05, 1932, LOS: 5 ADMISSION DATE:  08/30/2018, CONSULTATION DATE:  08/02/2018 REFERRING MD:  Pearlean Brownie, CHIEF COMPLAINT:  confusion   Brief History   84 y/o female admitted for confusion due to PRES syndrome.   Past Medical History  GERD, HYN, Heart murmur, IBS, Osteoarthritis  Significant Hospital Events   11/13 Intubated for airway protection  11/14Ischemic left hand > resolved Atrial fibrillation 11/13 TTE LVEF 30-35%  Consults:  PCCM Vascular surgery for left subclavian to left ulnar/radial artery occlusion> conservative management, avoid vasopressors  Procedures:  11/12 multifocal acute ischemia and subarachnoid hemorrahge superimposed on poster white mater hyerpintensity PRES, SAH R frontal left temporal lobes,  11/15 CT head > new multi-fical cerebral and cerebellar cytoxic edema, likely progressed and severe PRES, partially regressed intracranial blood since 11/11, no new or increased intracranial hemorrhage  Significant Diagnostic Tests:  11/11 CT Angiogram and perfusion study > negative perfusion study, stenosis L ICA, no intracrnial large or medium vessel occlusion, 30-50% stenosis R vertebral artery origin 11/11 CT head > acute SAG R frontal lobe and inf left temporal lobe, no mass effect 11/12 LP > WBC 16, glucose normal, protein 48, high RBC count 11/12 multifocal acute ischemia and subarachnoid hemorrahge superimposed on poster white mater hyerpintensity PRES, SAH R frontal left temporal lobes,  11/13 TTE LVEF 30-35% left ant dec territory wall motion abnormality 11/14 vas upper extremity U/S monophasic flow in subclavian through left ulnar artery with thrombosed left Radial artery 11/15 CT head > new multi-fical cerebral and cerebellar cytoxic edema, likely progressed and severe PRES, partially regressed intracranial blood since 11/11, no new or increased intracranial hemorrhage 11/15 EKG> Afib, wellens   Micro Data:    11/11 urine >  11/11 Blood > 11/12 CSF >    Antimicrobials:  11/11 ganciclovir> 11/12 11/11 vanc > 11/12 11/12 mero >   Interim history/subjective:  Fever overnight Remains in atrial fibrillation Weak on vent, not following commands  Objective   Blood pressure (!) 122/45, pulse (!) 111, temperature 98.8 F (37.1 C), resp. rate 20, height 4\' 11"  (1.499 m), weight 48.2 kg, SpO2 100 %.    Vent Mode: CPAP;PSV FiO2 (%):  [40 %] 40 % Set Rate:  [20 bmp] 20 bmp Vt Set:  [340 mL] 340 mL PEEP:  [5 cmH20] 5 cmH20 Pressure Support:  [5 cmH20] 5 cmH20 Plateau Pressure:  [8 cmH20-13 cmH20] 8 cmH20   Intake/Output Summary (Last 24 hours) at 08/06/2018 0904 Last data filed at 08/06/2018 0800 Gross per 24 hour  Intake 1565.14 ml  Output 2830 ml  Net -1264.86 ml   Filed Weights   Aug 30, 2018 1442 08/04/18 0500 08/05/18 0500  Weight: 51.3 kg 49.8 kg 48.2 kg    Examination:  General:  In bed on vent HENT: NCAT ETT in place PULM: CTA B, vent supported breathing CV: Irreg irreg, no mgr GI: BS+, soft, nontender MSK: normal bulk and tone Neuro: eyes open, doesn't follow commands    Resolved Hospital Problem list   Left hand transient ischemia related to subclavian stenosis and thrombosis in radial artery  Assessment & Plan:  84 y/o female with PRES, subarachnoid hemorrhage, atrial fibrillation with RVR, new systolic heart failure.   PRES Blood pressure management per neurology/stroke service  Subarachnoid hemorrhage: Blood pressure management per neurology/stroke service Minimize anticoagulants  Atrial fibrillation with rapid ventricular response Continue amiodarone Continue metoprolol Continue dig for now Consult cardiology Hold full dose anticoagulation for now  New systolic heart failure with left anterior descending wall motion abnormality and question ischemic change on EKG Cardiology consult Continue metoprolol Continue furosemide twice a day  New  fever: Panculture: Blood, respiratory Hold on antibiotics for now Chest x-ray now  Bilateral pleural effusions/pulmonary edema: Continue scheduled furosemide Monitor BMET and UOP Replace electrolytes as needed    Best practice:  Diet: Tube feeding Pain/Anxiety/Delirium protocol (if indicated): Continue PAD protocol, Precedex, RA SS: 0 VAP protocol (if indicated): Yes DVT prophylaxis: Lovenox GI prophylaxis: Famotidine Glucose control: Sliding scale insulin Mobility: PT consult/advance mobility as able Code Status: Limited code: no CPR, will clarify with family later today Family Communication: Updated this morning, I let them know I would consult cardiology Disposition: remain in ICU  Labs   CBC: Recent Labs  Lab 07/26/2018 1446 08/08/2018 1501 08/03/18 0501 08/04/18 0513 08/05/18 0904  WBC 11.4*  --  21.4* 18.7* 12.8*  NEUTROABS 8.2*  --   --   --   --   HGB 12.4 13.3 12.9 10.7* 9.7*  HCT 40.9 39.0 38.5 35.0* 30.2*  MCV 88.1  --  83.3 87.3 87.0  PLT 367  --  418* 285 195    Basic Metabolic Panel: Recent Labs  Lab 08/03/18 0501 08/03/18 1826 08/04/18 0513 08/04/18 1611 08/04/18 1943 08/05/18 0904 08/05/18 2027  NA 135  --  137  --  136 136 135  K 2.3* 2.4* 4.3 3.8 3.2* 3.1* 3.4*  CL 106  --  112*  --  105 104 100  CO2 18*  --  17*  --  19* 21* 24  GLUCOSE 195*  --  190*  --  201* 219* 153*  BUN 6*  --  20  --  27* 29* 28*  CREATININE 0.57  --  0.59  --  0.71 0.80 0.62  CALCIUM 7.4*  --  7.2*  --  7.2* 7.6* 7.7*  MG 1.4* 1.5* 2.2 2.0  --  1.9  --   PHOS  --  1.2* <1.0* 3.5  --   --   --    GFR: Estimated Creatinine Clearance: 35.7 mL/min (by C-G formula based on SCr of 0.62 mg/dL). Recent Labs  Lab 07/30/2018 1446 08/03/18 0501 08/04/18 0513 08/05/18 0904  WBC 11.4* 21.4* 18.7* 12.8*    Liver Function Tests: Recent Labs  Lab 08/03/2018 1446 08/04/18 0513  AST 25  --   ALT 21  --   ALKPHOS 76  --   BILITOT 0.6  --   PROT 6.6  --   ALBUMIN 3.6  2.4*   No results for input(s): LIPASE, AMYLASE in the last 168 hours. No results for input(s): AMMONIA in the last 168 hours.  ABG    Component Value Date/Time   PHART 7.369 08/03/2018 1830   PCO2ART 30.0 (L) 08/03/2018 1830   PO2ART 423.0 (H) 08/03/2018 1830   HCO3 17.3 (L) 08/03/2018 1830   TCO2 18 (L) 08/03/2018 1830   ACIDBASEDEF 7.0 (H) 08/03/2018 1830   O2SAT 100.0 08/03/2018 1830     Coagulation Profile: Recent Labs  Lab 07/26/2018 1446  INR 1.02    Cardiac Enzymes: No results for input(s): CKTOTAL, CKMB, CKMBINDEX, TROPONINI in the last 168 hours.  HbA1C: Hgb A1c MFr Bld  Date/Time Value Ref Range Status  08/02/2018 04:38 AM 5.6 4.8 - 5.6 % Final    Comment:    (NOTE) Pre diabetes:          5.7%-6.4% Diabetes:              >  6.4% Glycemic control for   <7.0% adults with diabetes     CBG: Recent Labs  Lab 08/05/18 1748 08/05/18 2011 08/05/18 2326 08/06/18 0312 08/06/18 0807  GLUCAP 151* 141* 135* 140* 153*     Critical care time: 45 minutes    Heber Arion, MD [facility] Pager: [phone] Cell: [phone] If no response, call [phone]

## 2018-08-06 NOTE — Progress Notes (Signed)
2000- Patient in NSR 2145- Patient converted into a-fib, rate 90-110 2245- Patient converted back into NSR 0400- Patient back in a-fib, rate 100-120

## 2018-08-06 NOTE — Plan of Care (Signed)
  Interdisciplinary Goals of Care Family Meeting   Date carried out:: 08/06/2018  Location of the meeting: Bedside  Member's involved: Physician, Bedside Registered Nurse, Social Worker and Family Member or next of kin  Durable Power of Attorney or acting medical decision maker: Son and daughter    Discussion: We discussed goals of care for [employer] C Passon .  The patient's children understand that her prognosis is poor given the severity of brain injury, systolic heart failure and her prolonged ICU stay. They explained to me that she has always made it clear that she did not want to be maintained on life support for long periods of time and they know she would never want to go to a nursing home.  They wish to withdraw care tomorrow.   Will not escalate care overnight tonight.   Code status: Full DNR  Disposition: In-patient comfort care  Time spent for the meeting: 20 minutes  Max FickleDouglas Evertte Sones 08/06/2018, 5:54 PM

## 2018-08-06 NOTE — Progress Notes (Signed)
Verbal order per [place] to stop Amio gtt at 0700 if patient in NSR. Patient in a-fib rate 100- 120s. Gtt not stopped at this time.

## 2018-08-06 NOTE — Progress Notes (Signed)
STROKE TEAM PROGRESS NOTE   INTERVAL HISTORY Her son and daughter are at the bedside. Pt obtunded, still intubated on vent, overnight continues to have Afib RVR, but off pressors, will have cardiology consult. I had long discussion with son and daughter at bedside, updated pt current condition, treatment plan and poor prognosis. They expressed understanding and appreciation.    Vitals:   08/06/18 1300 08/06/18 1400 08/06/18 1500 08/06/18 1547  BP:    (!) 119/46  Pulse: 80 82 (!) 102 (!) 104  Resp: (!) 21 20 (!) 30 (!) 24  Temp: 99 F (37.2 C) 99.1 F (37.3 C) 99.5 F (37.5 C) 99.9 F (37.7 C)  TempSrc:      SpO2: 100% 100% 100% 100%  Weight:      Height:        CBC:  Recent Labs  Lab 08/05/2018 1446  08/04/18 0513 08/05/18 0904  WBC 11.4*   < > 18.7* 12.8*  NEUTROABS 8.2*  --   --   --   HGB 12.4   < > 10.7* 9.7*  HCT 40.9   < > 35.0* 30.2*  MCV 88.1   < > 87.3 87.0  PLT 367   < > 285 195   < > = values in this interval not displayed.    Basic Metabolic Panel:  Recent Labs  Lab 08/04/18 0513 08/04/18 1611  08/05/18 0904 08/05/18 2027  NA 137  --    < > 136 135  K 4.3 3.8   < > 3.1* 3.4*  CL 112*  --    < > 104 100  CO2 17*  --    < > 21* 24  GLUCOSE 190*  --    < > 219* 153*  BUN 20  --    < > 29* 28*  CREATININE 0.59  --    < > 0.80 0.62  CALCIUM 7.2*  --    < > 7.6* 7.7*  MG 2.2 2.0  --  1.9  --   PHOS <1.0* 3.5  --   --   --    < > = values in this interval not displayed.   Lipid Panel:     Component Value Date/Time   CHOL 157 08/02/2018 0438   TRIG 83 08/02/2018 0438   HDL 64 08/02/2018 0438   CHOLHDL 2.5 08/02/2018 0438   VLDL 17 08/02/2018 0438   LDLCALC 76 08/02/2018 0438   HgbA1c:  Lab Results  Component Value Date   HGBA1C 5.6 08/02/2018   Urine Drug Screen:     Component Value Date/Time   LABOPIA NONE DETECTED 07/25/2018 2110   COCAINSCRNUR NONE DETECTED 08/08/2018 2110   LABBENZ NONE DETECTED 08/13/2018 2110   AMPHETMU NONE  DETECTED 08/11/2018 2110   THCU NONE DETECTED 08/11/2018 2110   LABBARB NONE DETECTED 08/02/2018 2110    Alcohol Level     Component Value Date/Time   ETH <10 08/02/2018 1515    IMAGING  Ct Angio Head W Or Wo Contrast Ct Angio Neck W Or Wo Contrast Ct Cerebral Perfusion W Contrast 08/17/2018 IMPRESSION:  Negative perfusion study. Atherosclerotic disease at both carotid bifurcations. No stenosis on the right. 20% ICA stenosis on the left. Atherosclerotic calcification in both carotid siphon regions but without stenosis greater than 30%. No intracranial large or medium vessel occlusion or correctable proximal stenosis. 30-50% stenosis at the right vertebral artery origin.   Ct Head Wo Contrast 08/05/2018   IMPRESSION:  1. New multifocal  bilateral cerebral and cerebellar confluent cytotoxic edema. The constellation of CT and recent MRI findings is most compatible with progressed and severe PRES (posterior reversible encephalopathy syndrome).  2. Partially regressed intracranial blood since 05/25/2018. No new or increased intracranial hemorrhage.   Ct Head Wo Contrast 05/25/2018 IMPRESSION:  Acute subarachnoid hemorrhage overlying the right frontal lobe and inferior left temporal lobe. No midline shift or other mass effect.   Ct Head Code Stroke Wo Contrast 05/25/2018 IMPRESSION: Significantly motion degraded study without acute finding.   Mr Maxine GlennMra Head Wo Contrast 08/02/2018 IMPRESSION:  1. Multifocal acute ischemia and subarachnoid hemorrhage superimposed on bilateral posterior predominant white matter hyperintensity. 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  2. Areas of subarachnoid hemorrhage in the right frontal and left temporal lobes are unchanged. There are new foci of hemorrhage in the left occipital lobe compared to the earlier head CT.  3. No emergent large vessel occlusion or high-grade stenosis of the intracranial  arteries.   Dg Chest Port 1 View 08/06/2018 IMPRESSION:  Bilateral pleural effusions. Tubes and lines as described.   Dg Chest Port 1 View 08/05/2018  IMPRESSION:  CHF superimposed upon COPD. Slight interval decrease in pulmonary interstitial edema. Small bilateral pleural effusions. The support tubes are in reasonable position.   Dg Chest Port 1 View 08/03/2018 IMPRESSION:  1. Support lines and tubing in satisfactory position.  2. Bilateral diffuse mild interstitial thickening which may reflect pulmonary edema versus multilobar pneumonia.   Dg Chest Port 1 View 08/02/2018 IMPRESSION:  No acute abnormalities.     [place] Ekg Site Rite 08/02/2018 If Site Rite image not attached, placement could not be confirmed due to current cardiac rhythm.  2D echocardiogram Diminished ejection fraction 30-35% no thrombus. Anterior, anterior septal, apical and inferior pico severe hypokinesis  EEG This EEG is characterized by slowing which is consistent with normal drowse.  Can not rule out the possibility of slowing related to general cerebral disturbance such as a metabolic encephalopathy.     PHYSICAL EXAM  Temp:  [98.2 F (36.8 C)-100.9 F (38.3 C)] 99.9 F (37.7 C) (11/16 1547) Pulse Rate:  [68-122] 104 (11/16 1547) Resp:  [15-30] 24 (11/16 1547) BP: (119-138)/(45-54) 119/46 (11/16 1547) SpO2:  [98 %-100 %] 100 % (11/16 1547) Arterial Line BP: (89-165)/(37-85) 120/51 (11/16 1500) FiO2 (%):  [40 %] 40 % (11/16 1547)  General - Well nourished, well developed, intubated, and obtunded.  Ophthalmologic - fundi not visualized due to noncooperation.  Cardiovascular - irregularly irregular heart rate and rhythm, in afib RVR.  Neuro - intubated on fentanyl, obtunded, able to open eyes on voice but not tracking, not following commands, not blinking to visual threat bilaterally. PERRL. Facial symmetry not able to check due to ET tube. Tongue midline in mouth. On pain stimulation, no  movement of BUEs, mild withdraw BLEs, b/l babinski positive. Sensation, coordination and gait not tested.   ASSESSMENT/PLAN Ms. Martyn Ehrichnn C Pyatt is a 84 y.o. female with history of PVD, HLD, HTN, and anxiety presenting [facility] with altered mental status.  Progressive confusion throughout the day.  In the emergency room, felt to have a new onset stroke.  Received IV tPA 05/25/2018 at 1532.  Patient with post TPA hemorrhage and very encephalopathic with temperature up to 103 with stiff neck.  Unable to do LP as post TPA.  Fibrinogen level normal.  Started on empiric meningitic antibiotic coverage.  Developed hypotension of unclear etiology.  Critical care  physician consulted  Severe PRES vs. Multifocal bilateral embolic strokes:  CT head 08/08/2018 No acute stroke.  Motion degraded  CTA head & neck L ICA 20%.  R VA 30 to 50%.  Bilateral ICA bifurcation less than 30%.  CT perfusion negative  Repeat CT head post TPA shows acute SAH overlying right frontal lobe and inferior left temporal lobe.  MRI  Multifocal acute ischemia and SAH superimposed on bilateral posterior predominant white matter hyperintensity.   CT repeat 08/05/18 - progression of bilateral hypodensity - severe progressive PRES vs. Bilateral embolic infarcts  2D Echo EF 16-10% with normal PICA thrombus. Anterior, anterior septal, apical and inferior pico severe hypokinesis.  EEG no seizure.  Drowsy.  LDL 76  HgbA1c 5.6  lovenox for VTE prophylaxis  aspirin 81 mg daily prior to admission, now on No antithrombotic given post tPA hemorrhage  Therapy recommendations:  pending   Disposition:  pending   PAF with RVR  Continue to have afib RVR  On amiodarone and digoxin   Cardiology recs appreciated  Added metoprolol  Not candidate for [facility] at this time  CHF   EF 30-35%  On diuretics   Avoid fluid overlid  Respiratory failure  Not able to protect airway  Intubated on  sedation  Discussed family - pt does not want to live like this  Ischemic left hand  S/p A line vs. Embolic stroke  VVS on board  No surgery needed as left hand vessel viable  A line stooped   Continue monitoring  Febrile with leukocytosis  WBC 21.4->18.7->12.8  Temp 103 -> afebrile  Started on gabciclovir, meropenem, vanc 11/12  UA neg.  Culture no growth  CXR NAD  Code sepsis 11/12 am. CCM consulted.   Blood cultures no growth x2, less than 24 hours  Check labs in am  CSF is benign  Hypotension  Hx hypertension  ? Sepsis  Treated with IVF  BP goals in place per CCM . SBP goal < 160 given acute hemorrhage . Home meds: tenormin 50 bid . On levophed for BP control  Hyperlipidemia  Home meds:  lipitor 20, resumed in hospital  LDL 76, goal < 70  Continue statin at discharge  Other Stroke Risk Factors  Advanced age  PVD on pletal bid PTA  Other Active Problems  On estrace vaginal cream  Allergies on allegra and flonase PTA  Hypokalemia - 3.1 -> 3.4  Hospital day # 5  This patient is critically ill due to acute infarct, SAH post tPA, fever, leukocytosis, obtundation, PAF with RVR and at significant risk of neurological worsening, death form recurrent stroke, continued SAH, seizure, sepsis, heart failure. This patient's care requires constant monitoring of vital signs, hemodynamics, respiratory and cardiac monitoring, review of multiple databases, neurological assessment, discussion with family, other specialists and medical decision making of high complexity. I spent 45 minutes of neurocritical care time in the care of this patient. I had long discussion with son and daughter at bedside, updated pt current condition, treatment plan and poor prognosis. They expressed understanding and appreciation. They are thinking about palliative care if no improvement for the next a couple of days. Code status DNR.   Marvel Plan, MD PhD Stroke  Neurology 2018-08-14 12:58 AM         To contact Stroke Continuity provider, please refer to [email or web address]. After hours, contact General Neurology

## 2018-08-06 NOTE — Consult Note (Signed)
ELECTROPHYSIOLOGY CONSULT NOTE  Patient ID: Victoria Joseph, MRN: [id number], DOB/AGE: 1932/04/15 84 y.o. Admit date: 08/12/2018 Date of Consult: 08/06/2018  Primary Physician: Haywood Pao, MD Primary Cardiologist: Marlenne Ridge is a 84 y.o. female who is being seen today for the evaluation of Atrial fib at the request of Dr Erlinda Hong .   Chief Complaint: atrial fib   HPI Victoria Joseph is a 84 y.o. female seen in consultation because of paroxysms of atrial fibrillation with rapid ventricular response.  Admitted with confusion.  MRI compatible with PRES; initial clinical improvement was followed by shortness of breath attributed to volume overload requiring intubation.  Echo EF 30-35%  Developed an ischemic left hand; interval improvement and conservative management recommended by vascular surgery   Past Medical History:  Diagnosis Date  . Anxiety   . Arrhythmia    PVCs  . Diverticulosis   . GERD (gastroesophageal reflux disease)   . Heart murmur   . Hiatal hernia   . Hyperlipidemia   . Hypertension   . IBS (irritable bowel syndrome)   . Osteoarthritis   . Osteoporosis   . Pneumonia 2009  . PVD (peripheral vascular disease) ([place])   . Rhinitis, allergic   . Sinusitis   . URI (upper respiratory infection)       Surgical History:  Past Surgical History:  Procedure Laterality Date  . APPENDECTOMY    . CHOLECYSTECTOMY    . KNEE SURGERY     right repair after fall  . OOPHORECTOMY     still have 1 ovary     Home Meds: Prior to Admission medications   Medication Sig Start Date End Date Taking? Authorizing Provider  amitriptyline (ELAVIL) 25 MG tablet Take 25 mg by mouth at bedtime.    Yes [provider]  atenolol (TENORMIN) 50 MG tablet Take 1 tablet (50 mg total) by mouth 2 (two) times daily. 01/18/18  Yes Jettie Booze, MD  atorvastatin (LIPITOR) 20 MG tablet Take 1 tablet (20 mg total) by mouth daily. 01/18/18  Yes Jettie Booze, MD    Calcium Carbonate-Vitamin D (CALCIUM 600+D) 600-400 MG-UNIT tablet Take 1 tablet by mouth 2 (two) times daily.   Yes [provider]  cilostazol (PLETAL) 100 MG tablet Take 100 mg by mouth 2 (two) times daily.    Yes [provider]  colestipol (COLESTID) 1 g tablet Take 1 tablet (1 g total) by mouth 2 (two) times daily. 10/25/17  Yes Nandigam, Venia Minks, MD  ferrous sulfate 325 (65 FE) MG tablet Take 325 mg by mouth 2 (two) times daily. 01/10/18  Yes [provider]  fexofenadine (ALLEGRA) 180 MG tablet Take 180 mg by mouth daily.   Yes [provider]  fluticasone (FLONASE) 50 MCG/ACT nasal spray Place 2 sprays into both nostrils daily as needed for rhinitis.    Yes [provider]  zoledronic acid (RECLAST) 5 MG/100ML SOLN Inject 5 mg into the vein once. Pt receives injection once per year.   Yes [provider]  chlorthalidone (HYGROTON) 25 MG tablet Take 0.5 tablets (12.5 mg total) by mouth daily. 03/07/18   Jettie Booze, MD  ESTRACE VAGINAL 0.1 MG/GM vaginal cream Place vaginally See admin instructions. Insert a blueberry sized amount into the vagina twice weekly.    [provider]  famotidine (PEPCID) 20 MG tablet Take 20 mg by mouth daily.    12/15/11  [provider]  Potassium Chloride (KLOR-CON 10  PO) Take 10 mEq by mouth daily.    12/15/11  [provider]    Inpatient Medications:  .  stroke: mapping our early stages of recovery book   Does not apply Once  . amitriptyline  25 mg Oral QHS  . atorvastatin  20 mg Oral Daily  . calcium-vitamin D  1 tablet Oral BID  . chlorhexidine gluconate (MEDLINE KIT)  15 mL Mouth Rinse BID  . Chlorhexidine Gluconate Cloth  6 each Topical Daily  . digoxin  0.125 mg Per Tube Daily  . enoxaparin (LOVENOX) injection  40 mg Subcutaneous Q24H  . furosemide  60 mg Intravenous Q12H  . insulin aspart  1-3 Units Subcutaneous Q4H  . magnesium oxide  400 mg Oral Daily  .  mouth rinse  15 mL Mouth Rinse 10 times per day  . metoprolol tartrate  25 mg Per Tube TID  . multivitamin  15 mL Per Tube Daily  . pantoprazole (PROTONIX) IV  40 mg Intravenous QHS  . sodium chloride flush  10-40 mL Intracatheter Q12H      Allergies:  Allergies  Allergen Reactions  . Penicillins Hives, Other (See Comments) and Rash    Has patient had a PCN reaction causing immediate rash, facial/tongue/throat swelling, SOB or lightheadedness with hypotension: No Has patient had a PCN reaction causing severe rash involving mucus membranes or skin necrosis: No Has patient had a PCN reaction that required hospitalization No Has patient had a PCN reaction occurring within the last 10 years: No If all of the above answers are "NO", then may proceed with Cephalosporin use.  . Sulfa Antibiotics     nausea    Social History   Socioeconomic History  . Marital status: Widowed    Spouse name: Not on file  . Number of children: 2  . Years of education: Not on file  . Highest education level: Not on file  Occupational History  . 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 Financial resource strain: Not on file  . Food insecurity:    Worry: Not on file    Inability: Not on file  . Transportation needs:    Medical: Not on file    Non-medical: Not on file  Tobacco Use  . Smoking status: Never Smoker  . Smokeless tobacco: Never Used  . Tobacco comment: has exposure to 2nd hand smoke  Substance and Sexual Activity  . Alcohol use: No  . Drug use: No  . Sexual activity: Not on file  Lifestyle  . Physical activity:    Days per week: Not on file    Minutes per session: Not on file  . Stress: Not on file  Relationships  . Social connections:    Talks on phone: Not on file    Gets together: Not on file    Attends religious service: Not on file    Active member of club or organization: Not on file    Attends meetings of clubs  or organizations: Not on file    Relationship status: Not on file  . Intimate partner violence:    Fear of current or ex partner: Not on file    Emotionally abused: Not on file    Physically abused: Not on file    Forced sexual activity: Not on file  Other Topics Concern  . Not on file  Social History Narrative  . Not on file  Family History  Problem Relation Age of Onset  . Heart failure Mother   . Asthma Paternal Grandfather   . Colon cancer Neg Hx   . Heart attack Neg Hx   . Stroke Neg Hx   . Hypertension Neg Hx      ROS:  Please see the history of present illness.     All other systems reviewed and negative.    Physical Exam: Blood pressure (!) 122/45, pulse (!) 112, temperature 98.8 F (37.1 C), resp. rate 20, height _0  (1.499 m), weight 48.2 kg, SpO2 100 %. General: Thin intubated female unresponsive to voice or touch Head: Normocephalic, atraumatic, sclera non-icteric, no xanthomas, nares are without discharge. EENT: normal Lymph Nodes:  none Back: Not examined Neck: Negative for carotid bruits. JVD distended Lungs: Clear bilaterally to auscultation without wheezes, rales, or rhonchi. Breathing is unlabored. Heart: Irregularly irregular rate and rhythm with a 2/6 systolic murmur , rubs, or gallops appreciated. Abdomen: Soft, non-tender, non-distended with normoactive bowel sounds. No hepatomegaly. No rebound/guarding. No obvious abdominal masses. Msk:  Strength and tone appear normal for age. Extremities: No clubbing or cyanosis. No edema.  Distal pedal pulses are 2+ and equal bilaterally. Skin: Warm and Dry Neuro: Alert and oriented X 3. CN III-XII intact Grossly normal sensory and motor function . Psych:  Responds to questions appropriately with a normal affect.      Labs: Cardiac Enzymes No results for input(s): CKTOTAL, CKMB, TROPONINI in the last 72 hours. CBC Lab Results  Component Value Date   WBC 12.8 (H) 08/05/2018   HGB 9.7 (L) 08/05/2018     HCT 30.2 (L) 08/05/2018   MCV 87.0 08/05/2018   PLT 195 08/05/2018   PROTIME: No results for input(s): LABPROT, INR in the last 72 hours. Chemistry  Recent Labs  Lab 08/03/2018 1446  08/05/18 2027  NA 134*   < > 135  K 3.2*   < > 3.4*  CL 102   < > 100  CO2 23   < > 24  BUN 13   < > 28*  CREATININE 0.60   < > 0.62  CALCIUM 9.0   < > 7.7*  PROT 6.6  --   --   BILITOT 0.6  --   --   ALKPHOS 76  --   --   ALT 21  --   --   AST 25  --   --   GLUCOSE 118*   < > 153*   < > = values in this interval not displayed.   Lipids Lab Results  Component Value Date   CHOL 157 08/02/2018   HDL 64 08/02/2018   LDLCALC 76 08/02/2018   TRIG 83 08/02/2018   BNP No results found for: PROBNP Thyroid Function Tests: No results for input(s): TSH, T4TOTAL, T3FREE, THYROIDAB in the last 72 hours.  Invalid input(s): FREET3    Miscellaneous No results found for: DDIMER  Radiology/Studies:  Ct Angio Head W Or Wo Contrast  Result Date: 08/19/2018 CLINICAL DATA:  Acute stroke presentation. Altered level of consciousness. EXAM: CT ANGIOGRAPHY HEAD AND NECK CT PERFUSION BRAIN TECHNIQUE: Multidetector CT imaging of the head and neck was performed using the standard protocol during bolus administration of intravenous contrast. Multiplanar CT image reconstructions and MIPs were obtained to evaluate the vascular anatomy. Carotid stenosis measurements (when applicable) are obtained utilizing NASCET criteria, using the distal internal carotid diameter as the denominator. Multiphase CT imaging of the brain was performed following IV  bolus contrast injection. Subsequent parametric perfusion maps were calculated using RAPID software. CONTRAST:  161m ISOVUE-370 IOPAMIDOL (ISOVUE-370) INJECTION 76% COMPARISON:  Head CT earlier same day FINDINGS: CTA NECK FINDINGS Aortic arch: Aortic atherosclerosis. No aneurysm or dissection. Branching pattern of the brachiocephalic vessels from the arch is normal. Right  carotid system: Common carotid artery widely patent to the bifurcation region. There is soft and calcified plaque at the carotid bifurcation. Minimal diameter of the ICA bulb is 5 mm, this same as the more distal cervical ICA. Therefore no stenosis. Left carotid system: Common carotid artery widely patent to the bifurcation region. There is soft and calcified plaque at the carotid bifurcation and ICA bulb. Minimal diameter of the proximal ICA is 4 mm. Compared to a more distal cervical ICA diameter of 5 mm, this indicates a 20% stenosis. Vertebral arteries: Both subclavian arteries show atherosclerotic change but no flow limiting stenosis. There is atherosclerotic plaque at the right vertebral artery origin with stenosis estimated at 30-50%. The left vertebral artery origin is widely patent. Both vertebral arteries are widely patent beyond that through the cervical region to the foramen magnum. Skeleton: Ordinary cervical spondylosis. Other neck: No mass or lymphadenopathy. Upper chest: Apical pleural and parenchymal scarring. No active process. Review of the MIP images confirms the above findings CTA HEAD FINDINGS Anterior circulation: Both internal carotid arteries are patent through the skull base and siphon regions. There is extensive calcification in the carotid siphon regions but no stenosis more than 30% suspected. The anterior and middle cerebral vessels are patent without proximal stenosis, aneurysm or vascular malformation. No large or medium vessel occlusion identified. Posterior circulation: Both vertebral arteries are patent to the basilar. No basilar stenosis. Posterior circulation branch vessels are patent. Venous sinuses: Patent and normal. Anatomic variants: None significant. Delayed phase: No abnormal enhancement. Review of the MIP images confirms the above findings CT Brain Perfusion Findings: CBF (<30%) Volume: 054mPerfusion (Tmax>6.0s) volume: 72m672mismatch Volume: 72mL62mfarction Location:None  IMPRESSION: Negative perfusion study. Atherosclerotic disease at both carotid bifurcations. No stenosis on the right. 20% ICA stenosis on the left. Atherosclerotic calcification in both carotid siphon regions but without stenosis greater than 30%. No intracranial large or medium vessel occlusion or correctable proximal stenosis. 30-50% stenosis at the right vertebral artery origin. These results were communicated to at 4:01 pmon 11/13/2019by text page via the [facility] system. Electronically Signed   By: MarkNelson Chimes.   On: 07/31/2018 16:02   Ct Head Wo Contrast  Addendum Date: 08/05/2018   ADDENDUM REPORT: 08/05/2018 20:43 ADDENDUM: Findings of progressed, suspected severe PRES were communicated to Dr. KirkLeonel [phone] on 11/15/2019by text page via the [facility] system. Electronically Signed   By: H  HGenevie Nardos.   On: 08/05/2018 20:43   Result Date: 08/05/2018 CLINICAL DATA:  86 y69r old female with combined multifocal intracranial ischemia and subarachnoid hemorrhage favored due to posterior reversible encephalopathy syndrome. EXAM: CT HEAD WITHOUT CONTRAST TECHNIQUE: Contiguous axial images were obtained from the base of the skull through the vertex without intravenous contrast. COMPARISON:  Brain MRI 08/02/2018.  Head CT 07/27/2018. FINDINGS: Brain: Confluent progressive cytotoxic edema in both hemispheres. Involvement of the bilateral frontal, parietal, and occipital lobes. The temporal lobes are spared. The pattern is somewhat watershed bilaterally. There is similar new confluent edema in both cerebellar hemispheres, with only mild T2 and FLAIR signal abnormality in the cerebellum previously. Superimposed hyperdense right superior frontal gyrus and left posterior temporal lobe hemorrhage has mildly regressed.  No new or progressive intracranial hemorrhage. No intraventricular blood products. Stable ventricle size and configuration. No midline shift or significant intracranial mass  effect despite the multifocal cytotoxic edema. Basilar cisterns remain normal. Vascular: Calcified atherosclerosis at the skull base. No suspicious intracranial vascular hyperdensity. Skull: No acute osseous abnormality identified. Sinuses/Orbits: Visualized paranasal sinuses and mastoids are stable and well pneumatized. Other: The patient is now intubated and there may also be an oral enteric tube in place which is looped in the oropharynx. No acute orbit or scalp soft tissue findings. IMPRESSION: 1. New multifocal bilateral cerebral and cerebellar confluent cytotoxic edema. The constellation of CT and recent MRI findings is most compatible with progressed and severe PRES (posterior reversible encephalopathy syndrome). 2. Partially regressed intracranial blood since 08/06/2018. No new or increased intracranial hemorrhage. Electronically Signed: By: Genevie Darnice M.D. On: 08/05/2018 20:16   Ct Head Wo Contrast  Result Date: 08/13/2018 CLINICAL DATA:  Stroke follow-up EXAM: CT HEAD WITHOUT CONTRAST TECHNIQUE: Contiguous axial images were obtained from the base of the skull through the vertex without intravenous contrast. COMPARISON:  None. FINDINGS: Brain: There is subarachnoid hemorrhage overlying the right frontal lobe and inferior left temporal lobe. No associated midline shift or other mass effect. Size and configuration of the ventricles are unchanged. Vascular: Atherosclerotic calcification of the vertebral and internal carotid arteries at the skull base. No abnormal hyperdensity of the major intracranial arteries or dural venous sinuses. Skull: The visualized skull base, calvarium and extracranial soft tissues are normal. Sinuses/Orbits: No fluid levels or advanced mucosal thickening of the visualized paranasal sinuses. No mastoid or middle ear effusion. The orbits are normal. IMPRESSION: Acute subarachnoid hemorrhage overlying the right frontal lobe and inferior left temporal lobe. No midline shift or other  mass effect. Critical Value/emergent results were called by telephone at the time of interpretation on 08/10/2018 at 9:05 pm to Dr. Roland Rack , who verbally acknowledged these results. Electronically Signed   By: Ulyses Jarred M.D.   On: 07/29/2018 21:06   Ct Angio Neck W Or Wo Contrast  Result Date: 07/26/2018 CLINICAL DATA:  Acute stroke presentation. Altered level of consciousness. EXAM: CT ANGIOGRAPHY HEAD AND NECK CT PERFUSION BRAIN TECHNIQUE: Multidetector CT imaging of the head and neck was performed using the standard protocol during bolus administration of intravenous contrast. Multiplanar CT image reconstructions and MIPs were obtained to evaluate the vascular anatomy. Carotid stenosis measurements (when applicable) are obtained utilizing NASCET criteria, using the distal internal carotid diameter as the denominator. Multiphase CT imaging of the brain was performed following IV bolus contrast injection. Subsequent parametric perfusion maps were calculated using RAPID software. CONTRAST:  150m ISOVUE-370 IOPAMIDOL (ISOVUE-370) INJECTION 76% COMPARISON:  Head CT earlier same day FINDINGS: CTA NECK FINDINGS Aortic arch: Aortic atherosclerosis. No aneurysm or dissection. Branching pattern of the brachiocephalic vessels from the arch is normal. Right carotid system: Common carotid artery widely patent to the bifurcation region. There is soft and calcified plaque at the carotid bifurcation. Minimal diameter of the ICA bulb is 5 mm, this same as the more distal cervical ICA. Therefore no stenosis. Left carotid system: Common carotid artery widely patent to the bifurcation region. There is soft and calcified plaque at the carotid bifurcation and ICA bulb. Minimal diameter of the proximal ICA is 4 mm. Compared to a more distal cervical ICA diameter of 5 mm, this indicates a 20% stenosis. Vertebral arteries: Both subclavian arteries show atherosclerotic change but no flow limiting stenosis. There is  atherosclerotic plaque at  the right vertebral artery origin with stenosis estimated at 30-50%. The left vertebral artery origin is widely patent. Both vertebral arteries are widely patent beyond that through the cervical region to the foramen magnum. Skeleton: Ordinary cervical spondylosis. Other neck: No mass or lymphadenopathy. Upper chest: Apical pleural and parenchymal scarring. No active process. Review of the MIP images confirms the above findings CTA HEAD FINDINGS Anterior circulation: Both internal carotid arteries are patent through the skull base and siphon regions. There is extensive calcification in the carotid siphon regions but no stenosis more than 30% suspected. The anterior and middle cerebral vessels are patent without proximal stenosis, aneurysm or vascular malformation. No large or medium vessel occlusion identified. Posterior circulation: Both vertebral arteries are patent to the basilar. No basilar stenosis. Posterior circulation branch vessels are patent. Venous sinuses: Patent and normal. Anatomic variants: None significant. Delayed phase: No abnormal enhancement. Review of the MIP images confirms the above findings CT Brain Perfusion Findings: CBF (<30%) Volume: 54m Perfusion (Tmax>6.0s) volume: 014mMismatch Volume: 35m74mnfarction Location:None IMPRESSION: Negative perfusion study. Atherosclerotic disease at both carotid bifurcations. No stenosis on the right. 20% ICA stenosis on the left. Atherosclerotic calcification in both carotid siphon regions but without stenosis greater than 30%. No intracranial large or medium vessel occlusion or correctable proximal stenosis. 30-50% stenosis at the right vertebral artery origin. These results were communicated to at 4:01 pmon 11/24/2019by text page via the [facility] system. Electronically Signed   By: MarNelson ChimesD.   On: 07/24/2018 16:02   Mr MraJodene Namad Wo Contrast  Result Date: 08/02/2018 CLINICAL DATA:  Intracranial hemorrhage EXAM:  MRI HEAD WITHOUT AND WITH CONTRAST MRA HEAD WITHOUT CONTRAST TECHNIQUE: Multiplanar, multiecho pulse sequences of the brain and surrounding structures were obtained without and with intravenous contrast. Angiographic images of the head were obtained using MRA technique without contrast. CONTRAST:  5 mL Gadavist COMPARISON:  Head CT 08/09/2018 FINDINGS: MRI HEAD FINDINGS BRAIN: There is abnormal magnetic susceptibility effect and diffusion-weighted signal at the site of previously demonstrated subarachnoid hemorrhage of the left temporal lobe and right frontal lobe. There are multiple foci of diffusion abnormality within the left occipital lobe with associated blooming on susceptibility weighted imaging. The midline structures are normal. There is posterior predominant hyperintense T2-weighted signal of the white matter. Generalized atrophy without lobar predilection. SKULL AND UPPER CERVICAL SPINE: The visualized skull base, calvarium, upper cervical spine and extracranial soft tissues are normal. SINUSES/ORBITS: No fluid levels or advanced mucosal thickening. No mastoid or middle ear effusion. The orbits are normal. MRA HEAD FINDINGS Intracranial internal carotid arteries: Normal. Anterior cerebral arteries: Normal. Middle cerebral arteries: Normal. Posterior communicating arteries: Absent bilaterally. Posterior cerebral arteries: Normal. Basilar artery: Normal. Vertebral arteries: Left dominant. Normal. Superior cerebellar arteries: Normal. Inferior cerebellar arteries: Anterior inferior cerebellar arteries are not clearly visualized. Posterior inferior cerebellar arteries are normal. IMPRESSION: 1. Multifocal acute ischemia and subarachnoid hemorrhage superimposed on bilateral posterior predominant white matter hyperintensity. 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 2. Areas of subarachnoid hemorrhage in the right frontal and left temporal lobes are  unchanged. There are new foci of hemorrhage in the left occipital lobe compared to the earlier head CT. 3. No emergent large vessel occlusion or high-grade stenosis of the intracranial arteries. Electronically Signed   By: KevUlyses JarredD.   On: 08/02/2018 18:54   Mr BraJeri Cos KDntrast  Result Date: 08/02/2018 CLINICAL DATA:  Intracranial hemorrhage EXAM: MRI HEAD WITHOUT AND WITH  CONTRAST MRA HEAD WITHOUT CONTRAST TECHNIQUE: Multiplanar, multiecho pulse sequences of the brain and surrounding structures were obtained without and with intravenous contrast. Angiographic images of the head were obtained using MRA technique without contrast. CONTRAST:  5 mL Gadavist COMPARISON:  Head CT 08/08/2018 FINDINGS: MRI HEAD FINDINGS BRAIN: There is abnormal magnetic susceptibility effect and diffusion-weighted signal at the site of previously demonstrated subarachnoid hemorrhage of the left temporal lobe and right frontal lobe. There are multiple foci of diffusion abnormality within the left occipital lobe with associated blooming on susceptibility weighted imaging. The midline structures are normal. There is posterior predominant hyperintense T2-weighted signal of the white matter. Generalized atrophy without lobar predilection. SKULL AND UPPER CERVICAL SPINE: The visualized skull base, calvarium, upper cervical spine and extracranial soft tissues are normal. SINUSES/ORBITS: No fluid levels or advanced mucosal thickening. No mastoid or middle ear effusion. The orbits are normal. MRA HEAD FINDINGS Intracranial internal carotid arteries: Normal. Anterior cerebral arteries: Normal. Middle cerebral arteries: Normal. Posterior communicating arteries: Absent bilaterally. Posterior cerebral arteries: Normal. Basilar artery: Normal. Vertebral arteries: Left dominant. Normal. Superior cerebellar arteries: Normal. Inferior cerebellar arteries: Anterior inferior cerebellar arteries are not clearly visualized. Posterior inferior  cerebellar arteries are normal. IMPRESSION: 1. Multifocal acute ischemia and subarachnoid hemorrhage superimposed on bilateral posterior predominant white matter hyperintensity. 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 2. Areas of subarachnoid hemorrhage in the right frontal and left temporal lobes are unchanged. There are new foci of hemorrhage in the left occipital lobe compared to the earlier head CT. 3. No emergent large vessel occlusion or high-grade stenosis of the intracranial arteries. Electronically Signed   By: Ulyses Jarred M.D.   On: 08/02/2018 18:54   Ct Cerebral Perfusion W Contrast  Result Date: 08/05/2018 CLINICAL DATA:  Acute stroke presentation. Altered level of consciousness. EXAM: CT ANGIOGRAPHY HEAD AND NECK CT PERFUSION BRAIN TECHNIQUE: Multidetector CT imaging of the head and neck was performed using the standard protocol during bolus administration of intravenous contrast. Multiplanar CT image reconstructions and MIPs were obtained to evaluate the vascular anatomy. Carotid stenosis measurements (when applicable) are obtained utilizing NASCET criteria, using the distal internal carotid diameter as the denominator. Multiphase CT imaging of the brain was performed following IV bolus contrast injection. Subsequent parametric perfusion maps were calculated using RAPID software. CONTRAST:  116m ISOVUE-370 IOPAMIDOL (ISOVUE-370) INJECTION 76% COMPARISON:  Head CT earlier same day FINDINGS: CTA NECK FINDINGS Aortic arch: Aortic atherosclerosis. No aneurysm or dissection. Branching pattern of the brachiocephalic vessels from the arch is normal. Right carotid system: Common carotid artery widely patent to the bifurcation region. There is soft and calcified plaque at the carotid bifurcation. Minimal diameter of the ICA bulb is 5 mm, this same as the more distal cervical ICA. Therefore no stenosis. Left carotid system: Common carotid artery  widely patent to the bifurcation region. There is soft and calcified plaque at the carotid bifurcation and ICA bulb. Minimal diameter of the proximal ICA is 4 mm. Compared to a more distal cervical ICA diameter of 5 mm, this indicates a 20% stenosis. Vertebral arteries: Both subclavian arteries show atherosclerotic change but no flow limiting stenosis. There is atherosclerotic plaque at the right vertebral artery origin with stenosis estimated at 30-50%. The left vertebral artery origin is widely patent. Both vertebral arteries are widely patent beyond that through the cervical region to the foramen magnum. Skeleton: Ordinary cervical spondylosis. Other neck: No mass or lymphadenopathy. Upper chest: Apical pleural and parenchymal scarring. No  active process. Review of the MIP images confirms the above findings CTA HEAD FINDINGS Anterior circulation: Both internal carotid arteries are patent through the skull base and siphon regions. There is extensive calcification in the carotid siphon regions but no stenosis more than 30% suspected. The anterior and middle cerebral vessels are patent without proximal stenosis, aneurysm or vascular malformation. No large or medium vessel occlusion identified. Posterior circulation: Both vertebral arteries are patent to the basilar. No basilar stenosis. Posterior circulation branch vessels are patent. Venous sinuses: Patent and normal. Anatomic variants: None significant. Delayed phase: No abnormal enhancement. Review of the MIP images confirms the above findings CT Brain Perfusion Findings: CBF (<30%) Volume: 76m Perfusion (Tmax>6.0s) volume: 084mMismatch Volume: 67m12mnfarction Location:None IMPRESSION: Negative perfusion study. Atherosclerotic disease at both carotid bifurcations. No stenosis on the right. 20% ICA stenosis on the left. Atherosclerotic calcification in both carotid siphon regions but without stenosis greater than 30%. No intracranial large or medium vessel  occlusion or correctable proximal stenosis. 30-50% stenosis at the right vertebral artery origin. These results were communicated to at 4:01 pmon 11/09/2019by text page via the [facility] system. Electronically Signed   By: MarNelson ChimesD.   On: 08/12/2018 16:02   Dg Chest Port 1 View  Result Date: 08/06/2018 CLINICAL DATA:  Acute respiratory failure and fevers EXAM: PORTABLE CHEST 1 VIEW COMPARISON:  08/05/2018 FINDINGS: Cardiac shadow is stable. Endotracheal tube and nasogastric catheter are again seen and stable. Right-sided central venous line is noted in the mid superior vena cava. Small effusions are again noted bilaterally and stable. Mild left retrocardiac atelectasis is noted better visualized on the current exam. No pneumothorax is seen. Biapical scarring is noted. Mild interstitial changes are again seen. IMPRESSION: Bilateral pleural effusions. Tubes and lines as described. Electronically Signed   By: MarInez CatalinaD.   On: 08/06/2018 09:58   Dg Chest Port 1 View  Result Date: 08/05/2018 CLINICAL DATA:  Respiratory failure, intubated patient, sepsis, hypotension EXAM: PORTABLE CHEST 1 VIEW COMPARISON:  Portable chest x-ray of August 03, 2018 FINDINGS: The lungs remain hyperinflated. The interstitial markings remain increased but have improved somewhat. There small bilateral pleural effusions. The cardiac silhouette is enlarged. The pulmonary vascularity is engorged and indistinct. There is no pneumothorax. The endotracheal tube tip lies 2.1 cm above the carina. The esophagogastric tube tip and proximal port project below the GE junction. The right PICC line tip projects over the midportion of the SVC. IMPRESSION: CHF superimposed upon COPD. Slight interval decrease in pulmonary interstitial edema. Small bilateral pleural effusions. The support tubes are in reasonable position. Electronically Signed   By: David  JorMartiniqueD.   On: 08/05/2018 09:08   Dg Chest Port 1 View  Result  Date: 08/03/2018 CLINICAL DATA:  Endotracheal tube placement EXAM: PORTABLE CHEST 1 VIEW COMPARISON:  08/02/2018 FINDINGS: Endotracheal tube with the tip 2.2 cm above the carina. Nasogastric tube with the tip projecting over the stomach. Right-sided PICC line with the tip projecting over the SVC. Bilateral diffuse interstitial thickening. No pneumothorax. Possible trace right pleural effusion. Stable cardiomediastinal silhouette. No acute osseous abnormality. IMPRESSION: 1. Support lines and tubing in satisfactory position. 2. Bilateral diffuse mild interstitial thickening which may reflect pulmonary edema versus multilobar pneumonia. Electronically Signed   By: HetKathreen DevoidOn: 08/03/2018 13:04   Dg Chest Port 1 View  Result Date: 08/02/2018 CLINICAL DATA:  Febrile. EXAM: PORTABLE CHEST 1 VIEW COMPARISON:  12/14/2009 FINDINGS: Heart size and pulmonary vascularity  are normal. Lungs are clear. Density in the lower mediastinum probably represents a hiatal hernia. No acute bone abnormality. Old compression fractures. IMPRESSION: No acute abnormalities. Electronically Signed   By: Lorriane Shire M.D.   On: 08/02/2018 09:26   Ct Head Code Stroke Wo Contrast  Result Date: 08/10/2018 CLINICAL DATA:  Code stroke.  Altered level of consciousness. EXAM: CT HEAD WITHOUT CONTRAST TECHNIQUE: Contiguous axial images were obtained from the base of the skull through the vertex without intravenous contrast. COMPARISON:  None. FINDINGS: Brain: No evidence of acute infarction, hemorrhage, hydrocephalus, extra-axial collection or mass lesion/mass effect. Generalized atrophy, mild to moderate for age. Vascular: Atherosclerotic calcification.  No hyperdense vessel. Skull: No acute finding Sinuses/Orbits: Negative Other: Motion artifact to a degree that findings could easily be obscured. These results were communicated to Dr. Rory Percy at 3:16 [place] 11/01/2019by text page via the [facility] messaging system. ASPECTS [facility] Stroke  Program Early CT Score) Unable to be accurately graded due to extent of motion artifact. IMPRESSION: Significantly motion degraded study without acute finding. Electronically Signed   By: Monte Fantasia M.D.   On: 08/17/2018 15:17   [place] Ekg Site Rite  Result Date: 08/02/2018 If Site Rite image not attached, placement could not be confirmed due to current cardiac rhythm.   EKG: Atrial fibrillation with a rapid rate right axis deviation   Assessment and Plan:  Congestive heart failure acute/chronic-systolic  Atrial fibrillation-paroxysmal-rapid ventricular response  Encephalopathy?  PRES syndrome versus multiple embolic events  Respiratory failure currently intubated  Hypokalemia  Right axis deviation new    Medical management of atrial fibrillation is limited to rate control.  We will continue amiodarone and digoxin.  Blood pressure is sufficiently high at this point that we could use low-dose beta-blockers.  Anticoagulation is precluded by subarachnoid multiple other hemorrhages noted on MRI scanning.  Right axis deviation raises the possibility of pulmonary embolism; however, treatment options are limited.  We will do venous Dopplers.  If they are abnormal, could consider a filter  Volume status is hard to assess apart from distended neck veins.  She does not have peripheral edema.  Markedly elevated BUN/creatinine ratio raises the possibility as is neurogenic pulmonary edema.  Would back off on the diuretics.    Virl Axe

## 2018-08-06 NOTE — Progress Notes (Addendum)
Spoke to E-Link MD Arsenio LoaderSommer. Per 08/06/18 1756 physician note, we will not escalate care overnight. Levophed, the vasopressor the patient had been on had been discontinued. I spoke to E-Link MD about whether the phenylephrine should be DCed as well. It was not infusing at the time, and had never been [facility] ([facility], older order for phenylephrine was D/Ced on 08/03/18), so infusing phenylephrine would be an escalation of care. New orders received for discontinuation of phenylephrine order. Will continue to monitor.

## 2018-08-07 DIAGNOSIS — I5021 Acute systolic (congestive) heart failure: Secondary | ICD-10-CM

## 2018-08-07 DIAGNOSIS — I9589 Other hypotension: Secondary | ICD-10-CM

## 2018-08-07 LAB — GLUCOSE, CAPILLARY
GLUCOSE-CAPILLARY: 136 mg/dL — AB (ref 70–99)
GLUCOSE-CAPILLARY: 74 mg/dL (ref 70–99)
Glucose-Capillary: 154 mg/dL — ABNORMAL HIGH (ref 70–99)

## 2018-08-07 LAB — CBC WITH DIFFERENTIAL/PLATELET
Abs Immature Granulocytes: 0.1 10*3/uL — ABNORMAL HIGH (ref 0.00–0.07)
BASOS ABS: 0 10*3/uL (ref 0.0–0.1)
Basophils Relative: 0 %
EOS ABS: 0.3 10*3/uL (ref 0.0–0.5)
Eosinophils Relative: 2 %
HCT: 32.6 % — ABNORMAL LOW (ref 36.0–46.0)
Hemoglobin: 10.3 g/dL — ABNORMAL LOW (ref 12.0–15.0)
IMMATURE GRANULOCYTES: 1 %
Lymphocytes Relative: 8 %
Lymphs Abs: 1.2 10*3/uL (ref 0.7–4.0)
MCH: 27.8 pg (ref 26.0–34.0)
MCHC: 31.6 g/dL (ref 30.0–36.0)
MCV: 87.9 fL (ref 80.0–100.0)
MONOS PCT: 6 %
Monocytes Absolute: 0.9 10*3/uL (ref 0.1–1.0)
NEUTROS PCT: 83 %
NRBC: 0 % (ref 0.0–0.2)
Neutro Abs: 11.9 10*3/uL — ABNORMAL HIGH (ref 1.7–7.7)
PLATELETS: 289 10*3/uL (ref 150–400)
RBC: 3.71 MIL/uL — ABNORMAL LOW (ref 3.87–5.11)
RDW: 15.8 % — ABNORMAL HIGH (ref 11.5–15.5)
WBC: 14.5 10*3/uL — ABNORMAL HIGH (ref 4.0–10.5)

## 2018-08-07 LAB — BASIC METABOLIC PANEL
ANION GAP: 9 (ref 5–15)
BUN: 31 mg/dL — AB (ref 8–23)
CHLORIDE: 100 mmol/L (ref 98–111)
CO2: 31 mmol/L (ref 22–32)
Calcium: 8.8 mg/dL — ABNORMAL LOW (ref 8.9–10.3)
Creatinine, Ser: 0.52 mg/dL (ref 0.44–1.00)
GFR calc Af Amer: >60 mL/min (ref >60)
GFR calc non Af Amer: >60 mL/min (ref >60)
GLUCOSE: 127 mg/dL — AB (ref 70–99)
POTASSIUM: 2.9 mmol/L — AB (ref 3.5–5.1)
Sodium: 140 mmol/L (ref 135–145)

## 2018-08-07 MED ORDER — DIPHENHYDRAMINE HCL 50 MG/ML IJ SOLN: 25.0000 mg | INTRAMUSCULAR | Status: DC | PRN

## 2018-08-07 MED ORDER — ACETAMINOPHEN 650 MG RE SUPP: 650.0000 mg | Freq: Four times a day (QID) | RECTAL | Status: DC | PRN

## 2018-08-07 MED ORDER — GLYCOPYRROLATE 1 MG PO TABS
1.0000 mg | ORAL_TABLET | ORAL | Status: DC | PRN
  Filled 2018-08-07: qty 1

## 2018-08-07 MED ORDER — GLYCOPYRROLATE 0.2 MG/ML IJ SOLN
0.2000 mg | INTRAMUSCULAR | Status: DC | PRN
  Administered 2018-08-07: 0.2 mg via INTRAVENOUS
  Filled 2018-08-07: qty 1

## 2018-08-07 MED ORDER — MIDAZOLAM HCL 2 MG/2ML IJ SOLN
2.0000 mg | INTRAMUSCULAR | Status: DC | PRN
  Administered 2018-08-07 (×2): 2 mg via INTRAVENOUS
  Filled 2018-08-07 (×2): qty 2

## 2018-08-07 MED ORDER — ACETAMINOPHEN 325 MG PO TABS: 650.0000 mg | ORAL_TABLET | Freq: Four times a day (QID) | ORAL | Status: DC | PRN

## 2018-08-07 MED ORDER — DEXTROSE 5 % IV SOLN: INTRAVENOUS | Status: DC

## 2018-08-07 MED ORDER — FENTANYL BOLUS VIA INFUSION
100.0000 ug | INTRAVENOUS | Status: DC | PRN
  Filled 2018-08-07: qty 100

## 2018-08-07 MED ORDER — POLYVINYL ALCOHOL 1.4 % OP SOLN
1.0000 [drp] | Freq: Four times a day (QID) | OPHTHALMIC | Status: DC | PRN
  Filled 2018-08-07: qty 15

## 2018-08-07 MED ORDER — FENTANYL 2500MCG IN NS 250ML (10MCG/ML) PREMIX INFUSION
0.0000 ug/h | INTRAVENOUS | Status: DC
  Filled 2018-08-07: qty 250

## 2018-08-07 MED ORDER — FENTANYL CITRATE (PF) 100 MCG/2ML IJ SOLN: 50.0000 ug | INTRAMUSCULAR | Status: DC | PRN

## 2018-08-07 MED ORDER — GLYCOPYRROLATE 0.2 MG/ML IJ SOLN: 0.2000 mg | INTRAMUSCULAR | Status: DC | PRN

## 2018-08-08 LAB — CULTURE, RESPIRATORY W GRAM STAIN

## 2018-08-08 LAB — GLUCOSE, CAPILLARY: Glucose-Capillary: 20 mg/dL — CL (ref 70–99)

## 2018-08-08 LAB — CULTURE, RESPIRATORY: GRAM STAIN: NONE SEEN

## 2018-08-11 LAB — CULTURE, BLOOD (ROUTINE X 2)
CULTURE: NO GROWTH
Culture: NO GROWTH
Special Requests: ADEQUATE

## 2018-08-21 NOTE — Progress Notes (Signed)
Patient was extubated per withdrawal of life support protocol.

## 2018-08-21 NOTE — Death Summary Note (Signed)
Stroke Discharge Summary  Patient ID: Victoria Joseph   MRN: [id number]      DOB: [birth year]  Date of Admission: 08-27-18 Date of Discharge: 08/06/2018  Attending Physician:  Marvel Plan, MD, Stroke MD Consultant(s):  CCM - Dr Randell Loop Surgery - Dr Myra Gianotti ; Cardiology - Dr Graciela Husbands Patient's PCP:  Gaspar Garbe, MD  DISCHARGE DIAGNOSIS:  Severe PRES vs. Multifocal bilateral embolic strokes - received IV tPA 08-27-2018 with post TPA hemorrhage. Active Problems:   CVA (cerebral vascular accident) [facility])   Past Medical History:  Diagnosis Date  . Anxiety   . Arrhythmia    PVCs  . Diverticulosis   . GERD (gastroesophageal reflux disease)   . Heart murmur   . Hiatal hernia   . Hyperlipidemia   . Hypertension   . IBS (irritable bowel syndrome)   . Osteoarthritis   . Osteoporosis   . Pneumonia 2009  . PVD (peripheral vascular disease) (HCC)   . Rhinitis, allergic   . Sinusitis   . URI (upper respiratory infection)    Past Surgical History:  Procedure Laterality Date  . APPENDECTOMY    . CHOLECYSTECTOMY    . KNEE SURGERY     right repair after fall  . OOPHORECTOMY     still have 1 ovary      LABORATORY STUDIES CBC    Component Value Date/Time   WBC 14.5 (H) 08/17/2018 0500   RBC 3.71 (L) 08/17/2018 0500   HGB 10.3 (L) 08/06/2018 0500   HCT 32.6 (L) 08/14/2018 0500   PLT 289 08/10/2018 0500   MCV 87.9 08/08/2018 0500   MCH 27.8 07/27/2018 0500   MCHC 31.6 08/06/2018 0500   RDW 15.8 (H) 07/24/2018 0500   LYMPHSABS 1.2 07/22/2018 0500   MONOABS 0.9 08/08/2018 0500   EOSABS 0.3 08/17/2018 0500   BASOSABS 0.0 08/16/2018 0500   CMP    Component Value Date/Time   NA 140 07/24/2018 0500   K 2.9 (L) 07/26/2018 0500   CL 100 08/18/2018 0500   CO2 31 08/06/2018 0500   GLUCOSE 127 (H) 07/29/2018 0500   BUN 31 (H) 08/16/2018 0500   CREATININE 0.52 08/01/2018 0500   CALCIUM 8.8 (L) 08/03/2018 0500   PROT 6.6 27-Aug-2018 1446   ALBUMIN 2.4 (L)  08/04/2018 0513   AST 25 08-27-2018 1446   ALT 21 Aug 27, 2018 1446   ALKPHOS 76 08/27/18 1446   BILITOT 0.6 2018-08-27 1446   GFRNONAA >60 07/30/2018 0500   GFRAA >60 08/05/2018 0500   COAGS Lab Results  Component Value Date   INR 1.02 August 27, 2018   INR 1.00 12/14/2009   Lipid Panel    Component Value Date/Time   CHOL 157 08/02/2018 0438   TRIG 83 08/02/2018 0438   HDL 64 08/02/2018 0438   CHOLHDL 2.5 08/02/2018 0438   VLDL 17 08/02/2018 0438   LDLCALC 76 08/02/2018 0438   HgbA1C  Lab Results  Component Value Date   HGBA1C 5.6 08/02/2018   Urinalysis    Component Value Date/Time   COLORURINE YELLOW Aug 27, 2018 2110   APPEARANCEUR CLOUDY (A) 08/27/2018 2110   LABSPEC 1.026 Aug 27, 2018 2110   PHURINE 6.0 08-27-2018 2110   GLUCOSEU NEGATIVE 08-27-18 2110   HGBUR MODERATE (A) 08/27/18 2110   BILIRUBINUR NEGATIVE Aug 27, 2018 2110   KETONESUR 5 (A) 27-Aug-2018 2110   PROTEINUR 100 (A) 08/27/2018 2110   NITRITE NEGATIVE August 27, 2018 2110   LEUKOCYTESUR TRACE (A) August 27, 2018 2110   Urine Drug  Screen     Component Value Date/Time   LABOPIA NONE DETECTED Aug 11, 2018 2110   COCAINSCRNUR NONE DETECTED 08-11-18 2110   LABBENZ NONE DETECTED 08-11-18 2110   AMPHETMU NONE DETECTED 08-11-2018 2110   THCU NONE DETECTED 08/11/2018 2110   LABBARB NONE DETECTED 2018-08-11 2110    Alcohol Level    Component Value Date/Time   ETH <10 11-Aug-2018 1515     SIGNIFICANT DIAGNOSTIC STUDIES  Ct Angio Head W Or Wo Contrast Ct Angio Neck W Or Wo Contrast Ct Cerebral Perfusion W Contrast August 11, 2018 IMPRESSION:  Negative perfusion study. Atherosclerotic disease at both carotid bifurcations. No stenosis on the right. 20% ICA stenosis on the left. Atherosclerotic calcification in both carotid siphon regions but without stenosis greater than 30%. No intracranial large or medium vessel occlusion or correctable proximal stenosis. 30-50% stenosis at the right vertebral artery origin.    Ct Head Wo Contrast 08/05/2018   IMPRESSION:  1. New multifocal bilateral cerebral and cerebellar confluent cytotoxic edema. The constellation of CT and recent MRI findings is most compatible with progressed and severe PRES (posterior reversible encephalopathy syndrome).  2. Partially regressed intracranial blood since 2018-08-11. No new or increased intracranial hemorrhage.   Ct Head Wo Contrast 2018/08/11 IMPRESSION:  Acute subarachnoid hemorrhage overlying the right frontal lobe and inferior left temporal lobe. No midline shift or other mass effect.   Ct Head Code Stroke Wo Contrast 11-Aug-2018 IMPRESSION: Significantly motion degraded study without acute finding.   Mr Maxine Glenn Head Wo Contrast 08/02/2018 IMPRESSION:  1. Multifocal acute ischemia and subarachnoid hemorrhage superimposed on bilateral posterior predominant white matter hyperintensity. 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  2. Areas of subarachnoid hemorrhage in the right frontal and left temporal lobes are unchanged. There are new foci of hemorrhage in the left occipital lobe compared to the earlier head CT.  3. No emergent large vessel occlusion or high-grade stenosis of the intracranial arteries.   Dg Chest Port 1 View 08/06/2018 IMPRESSION:  Bilateral pleural effusions. Tubes and lines as described.   Dg Chest Port 1 View 08/05/2018  IMPRESSION:  CHF superimposed upon COPD. Slight interval decrease in pulmonary interstitial edema. Small bilateral pleural effusions. The support tubes are in reasonable position.   Dg Chest Port 1 View 08/03/2018 IMPRESSION:  1. Support lines and tubing in satisfactory position.  2. Bilateral diffuse mild interstitial thickening which may reflect pulmonary edema versus multilobar pneumonia.   Dg Chest Port 1 View 08/02/2018 IMPRESSION:  No acute abnormalities.     [place] Ekg Site Rite 08/02/2018 If Site Rite  image not attached, placement could not be confirmed due to current cardiac rhythm.  2D echocardiogram Diminished ejection fraction 30-35% no thrombus. Anterior, anterior septal, apical and inferior pico severe hypokinesis  EEG This EEG is characterized by slowing which is consistent with normal drowse. Can not rule out the possibility of slowing related to general cerebral disturbance such as a metabolic encephalopathy.     HISTORY OF PRESENT ILLNESS (From H&P by Dr Wilford Corner on 08-11-18) Victoria Joseph is a 84 y.o. female with history of PVD, hyperlipidemia, hypertension, and anxiety.  Patient was transferred here from [facility] with altered mental status.  Last seen normal at 12 PM today when family member was speaking to her on the phone.  Patient was now unable to state her name and appears very confused.  She also complains of nausea and headache per EMS.  Per family at baseline patient is very alert  and oriented.  She lives in an assisted living.  She drove herself to the beach just this weekend.  This is a sudden onset change for her.  ED course: CT head, CTA head neck  LKW: 12 PM on 08-23-2018 tpa given?:  Yes Premorbid modified Rankin scale (mRS): 0    HOSPITAL COURSE [name] is a 84 y.o. female with history of PVD, HLD, HTN, and anxiety [facility] with [facility] status.  Progressive confusion throughout the day.  In the emergency room, felt to have a new onset stroke.  Received IV tPA 08-23-2018 at 1532.  Patient with post TPA hemorrhage and very encephalopathic with temperature up to 103 with stiff neck.  Unable to do LP as post TPA.  Fibrinogen level normal.  Started on empiric meningitic antibiotic coverage.  Developed hypotension of unclear etiology.  Critical care physician consulted.  Severe PRES vs. Multifocal bilateral embolic strokes:  CT head 2018-07-07 No acute stroke.  Motion degraded  CTA head & neck L ICA  20%.  R VA 30 to 50%.  Bilateral ICA bifurcation less than 30%.  CT perfusion negative  Repeat CT head post TPA shows acute SAH overlying right frontal lobe and inferior left temporal lobe.  MRI  Multifocal acute ischemia and SAH superimposed on bilateral posterior predominant white matter hyperintensity.   CT repeat 08/05/18 - progression of bilateral hypodensity - severe progressive PRES vs. Bilateral embolic infarcts  2D Echo EF 81-19%30-35% with normal PICA thrombus. Anterior, anterior septal, apical and inferior pico severe hypokinesis.  EEG no seizure.  Drowsy.  LDL 76  HgbA1c 5.6  lovenox for VTE prophylaxis  aspirin 81 mg daily prior to admission, now on No antithrombotic given post tPA hemorrhage  PAF with RVR  had afib RVR yesterday  Was on amiodarone and digoxin and metoprolol  Cardiology recs appreciated  [facility]  Will proceed with comfort care today  CHF   EF 30-35%  Was on diuretics   Avoid fluid overlid  Respiratory failure  Not able to protect airway  Intubated on sedation  Discussed family yesterday and today - family ready for comfort care and one way extubation  Dr. Kendrick FriesMcQuaid aware  Ischemic left hand  S/p A line vs. Embolic stroke  VVS on board  No surgery needed as left hand vessel viable  A line d/c'ed   Febrile with leukocytosis  WBC 21.4->18.7->12.8->14.5  Temp 103 -> afebrile  Proceed with comfort care  Hypotension  Hx hypertension  BP was low, ? sepsis  Treated with IVF and pressors  Family requested comfort care  Hyperlipidemia  Home meds:  lipitor 20, resumed in hospital  LDL 76, goal < 70  Other Stroke Risk Factors  Advanced age  PVD on pletal bid PTA  Other Active Problems  On estrace vaginal cream  Allergies on allegra and flonase PTA  Hypokalemia - 3.1 -> 3.4->2.9   DISCHARGE EXAM Blood pressure 111/77, pulse (!) 106, temperature 99.1 F (37.3 C), resp. rate  17, height 4\' 11"  (1.499 m), weight 47.5 kg, SpO2 (!) 37 %.  General - Well nourished, well developed, intubated, and in coma.  Ophthalmologic - fundi not visualized due to noncooperation.  Cardiovascular - irregularly irregular heart rate and rhythm, in afib but not AVR anymore.  Neuro - intubated on fentanyl, comatous, not open eyes on voice or pain, with eye forced opening, she is not tracking, not following commands, not  blinking to visual threat bilaterally. PERRL. Facial symmetry not able to check due to ET tube. Tongue midline in mouth. On pain stimulation, no movement of BUEs, mild withdraw BLEs, b/l babinski positive. Sensation, coordination and gait not tested.  Per Dr Roda Shutters:  "I have discussed with family yesterday and today, they would like to proceed with comfort care given poor prognosis and pt wishes"   Disposition:  Comfort care today 08/09/2018  08/02/2018 11:14 AM Respiratory Therapy Note: "Patient was extubated per withdrawal of life support protocol"   30 minutes were spent preparing discharge.  Delton See PA-C [facility] Pager [phone] 08/14/2018, 3:22 PM

## 2018-08-21 NOTE — Progress Notes (Signed)
STROKE TEAM PROGRESS NOTE   INTERVAL HISTORY Her son and daughter and other family members are at the bedside. Family has discussed with Victoria Joseph and would like to proceed with comfort care measures.   Vitals:   08/20/2018 0603 08/18/2018 0700 08/13/2018 0744 08/20/2018 0800  BP:  (!) 78/67  111/77  Pulse: (!) 103 94  93  Resp: 20 20  20   Temp: 99 F (37.2 C) 99.3 F (37.4 C)  99.1 F (37.3 C)  TempSrc:    Esophageal  SpO2: 100% 100% 100% 100%  Weight:      Height:        CBC:  Recent Labs  Lab 05/18/2018 1446  08/05/18 0904 08/03/2018 0500  WBC 11.4*   < > 12.8* 14.5*  NEUTROABS 8.2*  --   --  11.9*  HGB 12.4   < > 9.7* 10.3*  HCT 40.9   < > 30.2* 32.6*  MCV 88.1   < > 87.0 87.9  PLT 367   < > 195 289   < > = values in this interval not displayed.    Basic Metabolic Panel:  Recent Labs  Lab 08/04/18 0513 08/04/18 1611  08/05/18 0904 08/05/18 2027 07/22/2018 0500  NA 137  --    < > 136 135 140  K 4.3 3.8   < > 3.1* 3.4* 2.9*  CL 112*  --    < > 104 100 100  CO2 17*  --    < > 21* 24 31  GLUCOSE 190*  --    < > 219* 153* 127*  BUN 20  --    < > 29* 28* 31*  CREATININE 0.59  --    < > 0.80 0.62 0.52  CALCIUM 7.2*  --    < > 7.6* 7.7* 8.8*  MG 2.2 2.0  --  1.9  --   --   PHOS <1.0* 3.5  --   --   --   --    < > = values in this interval not displayed.   Lipid Panel:     Component Value Date/Time   CHOL 157 08/02/2018 0438   TRIG 83 08/02/2018 0438   HDL 64 08/02/2018 0438   CHOLHDL 2.5 08/02/2018 0438   VLDL 17 08/02/2018 0438   LDLCALC 76 08/02/2018 0438   HgbA1c:  Lab Results  Component Value Date   HGBA1C 5.6 08/02/2018   Urine Drug Screen:     Component Value Date/Time   LABOPIA NONE DETECTED 08-Apr-2018 2110   COCAINSCRNUR NONE DETECTED 08-Apr-2018 2110   LABBENZ NONE DETECTED 08-Apr-2018 2110   AMPHETMU NONE DETECTED 08-Apr-2018 2110   THCU NONE DETECTED 08-Apr-2018 2110   LABBARB NONE DETECTED 08-Apr-2018 2110    Alcohol Level     Component Value  Date/Time   ETH <10 08-Apr-2018 1515    IMAGING  Ct Angio Head W Or Wo Contrast Ct Angio Neck W Or Wo Contrast Ct Cerebral Perfusion W Contrast 08-Apr-2018 IMPRESSION:  Negative perfusion study. Atherosclerotic disease at both carotid bifurcations. No stenosis on the right. 20% ICA stenosis on the left. Atherosclerotic calcification in both carotid siphon regions but without stenosis greater than 30%. No intracranial large or medium vessel occlusion or correctable proximal stenosis. 30-50% stenosis at the right vertebral artery origin.   Ct Head Wo Contrast 08/05/2018   IMPRESSION:  1. New multifocal bilateral cerebral and cerebellar confluent cytotoxic edema. The constellation of CT and recent MRI findings is most compatible  with progressed and severe PRES (posterior reversible encephalopathy syndrome).  2. Partially regressed intracranial blood since 08/13/2018. No new or increased intracranial hemorrhage.   Ct Head Wo Contrast 07/24/2018 IMPRESSION:  Acute subarachnoid hemorrhage overlying the right frontal lobe and inferior left temporal lobe. No midline shift or other mass effect.   Ct Head Code Stroke Wo Contrast 07/29/2018 IMPRESSION: Significantly motion degraded study without acute finding.   Mr Maxine Glenn Head Wo Contrast 08/02/2018 IMPRESSION:  1. Multifocal acute ischemia and subarachnoid hemorrhage superimposed on bilateral posterior predominant white matter hyperintensity. 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  2. Areas of subarachnoid hemorrhage in the right frontal and left temporal lobes are unchanged. There are new foci of hemorrhage in the left occipital lobe compared to the earlier head CT.  3. No emergent large vessel occlusion or high-grade stenosis of the intracranial arteries.   Dg Chest Port 1 View 08/06/2018 IMPRESSION:  Bilateral pleural effusions. Tubes and lines as described.   Dg Chest Port 1  View 08/05/2018  IMPRESSION:  CHF superimposed upon COPD. Slight interval decrease in pulmonary interstitial edema. Small bilateral pleural effusions. The support tubes are in reasonable position.   Dg Chest Port 1 View 08/03/2018 IMPRESSION:  1. Support lines and tubing in satisfactory position.  2. Bilateral diffuse mild interstitial thickening which may reflect pulmonary edema versus multilobar pneumonia.   Dg Chest Port 1 View 08/02/2018 IMPRESSION:  No acute abnormalities.     [place] Ekg Site Rite 08/02/2018 If Site Rite image not attached, placement could not be confirmed due to current cardiac rhythm.  2D echocardiogram Diminished ejection fraction 30-35% no thrombus. Anterior, anterior septal, apical and inferior pico severe hypokinesis  EEG This EEG is characterized by slowing which is consistent with normal drowse.  Can not rule out the possibility of slowing related to general cerebral disturbance such as a metabolic encephalopathy.     PHYSICAL EXAM  Temp:  [98.8 F (37.1 C)-99.9 F (37.7 C)] 99.1 F (37.3 C) (11/17 0800) Pulse Rate:  [52-122] 93 (11/17 0800) Resp:  [16-30] 20 (11/17 0800) BP: (66-155)/(35-77) 111/77 (11/17 0800) SpO2:  [100 %] 100 % (11/17 0800) Arterial Line BP: (60-165)/(30-110) 158/64 (11/17 0800) FiO2 (%):  [40 %] 40 % (11/17 0800) Weight:  [47.5 kg] 47.5 kg (11/17 0437)  General - Well nourished, well developed, intubated, and in coma.  Ophthalmologic - fundi not visualized due to noncooperation.  Cardiovascular - irregularly irregular heart rate and rhythm, in afib but not AVR anymore.  Neuro - intubated on fentanyl, comatous, not open eyes on voice or pain, with eye forced opening, she is not tracking, not following commands, not blinking to visual threat bilaterally. PERRL. Facial symmetry not able to check due to ET tube. Tongue midline in mouth. On pain stimulation, no movement of BUEs, mild withdraw BLEs, b/l babinski positive.  Sensation, coordination and gait not tested.   ASSESSMENT/PLAN [name] is a 84 y.o. female with history of PVD, HLD, HTN, and anxiety presenting from [facility] with altered mental status.  Progressive confusion throughout the day.  In the emergency room, felt to have a new onset stroke.  Received IV tPA 07/22/2018 at 1532.  Patient with post TPA hemorrhage and very encephalopathic with temperature up to 103 with stiff neck.  Unable to do LP as post TPA.  Fibrinogen level normal.  Started on empiric meningitic antibiotic coverage.  Developed hypotension of unclear etiology.  Critical care physician  consulted  Severe PRES vs. Multifocal bilateral embolic strokes:  CT head 19-Aug-2018 No acute stroke.  Motion degraded  CTA head & neck L ICA 20%.  R VA 30 to 50%.  Bilateral ICA bifurcation less than 30%.  CT perfusion negative  Repeat CT head post TPA shows acute SAH overlying right frontal lobe and inferior left temporal lobe.  MRI  Multifocal acute ischemia and SAH superimposed on bilateral posterior predominant white matter hyperintensity.   CT repeat 08/05/18 - progression of bilateral hypodensity - severe progressive PRES vs. Bilateral embolic infarcts  2D Echo EF 95-62% with normal PICA thrombus. Anterior, anterior septal, apical and inferior pico severe hypokinesis.  EEG no seizure.  Drowsy.  LDL 76  HgbA1c 5.6  lovenox for VTE prophylaxis  aspirin 81 mg daily prior to admission, now on No antithrombotic given post tPA hemorrhage  I have discussed with family yesterday and today, they would like to proceed with comfort care given poor prognosis and pt wishes  Disposition:  Comfort care today   PAF with RVR  had afib RVR yesterday  Was on amiodarone and digoxin and metoprolol  Cardiology recs appreciated  Not candidate for [facility] at this time  Will proceed with comfort care today  CHF   EF 30-35%  Was on diuretics   Avoid fluid  overlid  Respiratory failure  Not able to protect airway  Intubated on sedation  Discussed family yesterday and today - family ready for comfort care and one way extubation  Dr. Kendrick Fries aware  Ischemic left hand  S/p A line vs. Embolic stroke  VVS on board  No surgery needed as left hand vessel viable  A line d/c'ed   Febrile with leukocytosis  WBC 21.4->18.7->12.8->14.5  Temp 103 -> afebrile  Proceed with comfort care  Hypotension  Hx hypertension  BP was low, ? sepsis  Treated with IVF and pressors . Family requested comfort care  Hyperlipidemia  Home meds:  lipitor 20, resumed in hospital  LDL 76, goal < 70  Other Stroke Risk Factors  Advanced age  PVD on pletal bid PTA  Other Active Problems  On estrace vaginal cream  Allergies on allegra and flonase PTA  Hypokalemia - 3.1 -> 3.4->2.9  Hospital day # 6  This patient is critically ill due to acute infarct, SAH post tPA, fever, leukocytosis, obtundation, PAF with RVR and at significant risk of neurological worsening, death form recurrent stroke, continued SAH, seizure, sepsis, heart failure. This patient's care requires constant monitoring of vital signs, hemodynamics, respiratory and cardiac monitoring, review of multiple databases, neurological assessment, discussion with family, other specialists and medical decision making of high complexity. I spent 45 minutes of neurocritical care time in the care of this patient. I had long discussion with son and daughter and other family members at bedside yesterday and today, updated pt current condition, treatment plan and poor prognosis. They requested comfort care measures today. Will proceed as per pt and family wishes.    Marvel Plan, MD PhD Stroke Neurology 07/23/2018 11:09 AM   To contact Stroke Continuity provider, please refer to [email or web address]. After hours, contact General Neurology

## 2018-08-21 NOTE — Progress Notes (Signed)
NAME:  Victoria Joseph, MRN:  [id number], DOB:  07/05/1932, LOS: 6 ADMISSION DATE:  07/23/2018, CONSULTATION DATE:  08/02/2018 REFERRING MD:  Leonie Man, CHIEF COMPLAINT:  confusion   Brief History   84 y/o female admitted for confusion due to PRES syndrome.  Past Medical History  GERD, HYN, Heart murmur, IBS, Osteoarthritis  Significant Hospital Events   11/13 Intubated for airway protection  11/14Ischemic left hand > resolved Atrial fibrillation 11/13 TTE LVEF 30-35%  Consults:  PCCM Vascular surgery for left subclavian to left ulnar/radial artery occlusion> conservative management, avoid vasopressors  Procedures:  11/12 multifocal acute ischemia and subarachnoid hemorrahge superimposed on poster white mater hyerpintensity PRES, SAH R frontal left temporal lobes,  11/15 CT head > new multi-fical cerebral and cerebellar cytoxic edema, likely progressed and severe PRES, partially regressed intracranial blood since 11/11, no new or increased intracranial hemorrhage  Significant Diagnostic Tests:  11/11 CT Angiogram and perfusion study > negative perfusion study, stenosis L ICA, no intracrnial large or medium vessel occlusion, 30-50% stenosis R vertebral artery origin 11/11 CT head > acute SAG R frontal lobe and inf left temporal lobe, no mass effect 11/12 LP > WBC 16, glucose normal, protein 48, high RBC count 11/12 multifocal acute ischemia and subarachnoid hemorrahge superimposed on poster white mater hyerpintensity PRES, SAH R frontal left temporal lobes,  11/13 TTE LVEF 30-35% left ant dec territory wall motion abnormality 11/14 vas upper extremity U/S monophasic flow in subclavian through left ulnar artery with thrombosed left Radial artery 11/15 CT head > new multi-fical cerebral and cerebellar cytoxic edema, likely progressed and severe PRES, partially regressed intracranial blood since 11/11, no new or increased intracranial hemorrhage 11/15 EKG> Afib, wellens   Micro Data:    11/11 urine >  11/11 Blood > 11/12 CSF >    Antimicrobials:  11/11 ganciclovir> 11/12 11/11 vanc > 11/12 11/12 mero >   Interim history/subjective:  Family conversation yesterday: prognosis poor, they feel she would not want this level of care and desire to withdraw care  Objective   Blood pressure 111/77, pulse 93, temperature 99.1 F (37.3 C), temperature source Esophageal, resp. rate 20, height '4\' 11"'$  (1.499 m), weight 47.5 kg, SpO2 100 %.    Vent Mode: PRVC FiO2 (%):  [40 %] 40 % Set Rate:  [20 bmp] 20 bmp Vt Set:  [340 mL] 340 mL PEEP:  [5 cmH20] 5 cmH20 Pressure Support:  [8 cmH20] 8 cmH20 Plateau Pressure:  [12 cmH20-17 cmH20] 16 cmH20   Intake/Output Summary (Last 24 hours) at Aug 17, 2018 5573 Last data filed at 08/17/2018 0800 Gross per 24 hour  Intake 2006.19 ml  Output 1790 ml  Net 216.19 ml   Filed Weights   08/04/18 0500 08/05/18 0500 08/17/18 0437  Weight: 49.8 kg 48.2 kg 47.5 kg    Examination:  General:  In bed on vent HENT: NCAT ETT in place PULM: CTA B, vent supported breathing CV: RRR, no mgr GI: BS+, soft, nontender MSK: normal bulk and tone Neuro: eyes open, some spontaneous movement but not to command or purposeful      Resolved Hospital Problem list   Left hand transient ischemia related to subclavian stenosis and thrombosis in radial artery  Assessment & Plan:  84 y/o female with PRES, subarachnoid hemorrhage, atrial fibrillation with RVR, new systolic heart failure.   Impression PRES syndrome Subarachnoid hemorrhage Atrial fibrillation with rapid ventricular response New systolic heart failure with left anterior descending wall motion abnormality and question ischemic change on  EKG Bilateral pleural effusions/pulmonary edema:  Plan: The family has made it clear they wish to withdraw care. I met with the extended family again this morning and they desire to proceed with withdrawal of care later this morning Will plan to  maintain life support until after rounds with full mechanical ventilator support After rounds: D/c mechanical ventilation Titrate up fentanyl infusion for comfort Start withdrawal of life support protocol D/c amiodarone drip Continue lasix for comfort Mouth care to continue Continue fentanyl drip as seems to be effective now Versed prn   Best practice:  Diet: Tube feeding Pain/Anxiety/Delirium protocol (if indicated): Continue PAD protocol, Precedex, RA SS: 0 VAP protocol (if indicated): Yes DVT prophylaxis: Lovenox GI prophylaxis: Famotidine Glucose control: Sliding scale insulin Mobility: PT consult/advance mobility as able Code Status: Limited code: no CPR, will clarify with family later today Family Communication: Updated this morning, I let them know I would consult cardiology Disposition: remain in ICU  Labs   CBC: Recent Labs  Lab 07/30/2018 1446 07/28/2018 1501 08/03/18 0501 08/04/18 0513 08/05/18 0904 08-11-2018 0500  WBC 11.4*  --  21.4* 18.7* 12.8* 14.5*  NEUTROABS 8.2*  --   --   --   --  11.9*  HGB 12.4 13.3 12.9 10.7* 9.7* 10.3*  HCT 40.9 39.0 38.5 35.0* 30.2* 32.6*  MCV 88.1  --  83.3 87.3 87.0 87.9  PLT 367  --  418* 285 195 226    Basic Metabolic Panel: Recent Labs  Lab 08/03/18 0501 08/03/18 1826 08/04/18 0513 08/04/18 1611 08/04/18 1943 08/05/18 0904 08/05/18 2027 2018/08/11 0500  NA 135  --  137  --  136 136 135 140  K 2.3* 2.4* 4.3 3.8 3.2* 3.1* 3.4* 2.9*  CL 106  --  112*  --  105 104 100 100  CO2 18*  --  17*  --  19* 21* 24 31  GLUCOSE 195*  --  190*  --  201* 219* 153* 127*  BUN 6*  --  20  --  27* 29* 28* 31*  CREATININE 0.57  --  0.59  --  0.71 0.80 0.62 0.52  CALCIUM 7.4*  --  7.2*  --  7.2* 7.6* 7.7* 8.8*  MG 1.4* 1.5* 2.2 2.0  --  1.9  --   --   PHOS  --  1.2* <1.0* 3.5  --   --   --   --    GFR: Estimated Creatinine Clearance: 35.7 mL/min (by C-G formula based on SCr of 0.52 mg/dL). Recent Labs  Lab 08/03/18 0501  08/04/18 0513 08/05/18 0904 11-Aug-2018 0500  WBC 21.4* 18.7* 12.8* 14.5*    Liver Function Tests: Recent Labs  Lab 08/09/2018 1446 08/04/18 0513  AST 25  --   ALT 21  --   ALKPHOS 76  --   BILITOT 0.6  --   PROT 6.6  --   ALBUMIN 3.6 2.4*   No results for input(s): LIPASE, AMYLASE in the last 168 hours. No results for input(s): AMMONIA in the last 168 hours.  ABG    Component Value Date/Time   PHART 7.369 08/03/2018 1830   PCO2ART 30.0 (L) 08/03/2018 1830   PO2ART 423.0 (H) 08/03/2018 1830   HCO3 17.3 (L) 08/03/2018 1830   TCO2 18 (L) 08/03/2018 1830   ACIDBASEDEF 7.0 (H) 08/03/2018 1830   O2SAT 100.0 08/03/2018 1830     Coagulation Profile: Recent Labs  Lab 07/25/2018 1446  INR 1.02    Cardiac Enzymes: No results  for input(s): CKTOTAL, CKMB, CKMBINDEX, TROPONINI in the last 168 hours.  HbA1C: Hgb A1c MFr Bld  Date/Time Value Ref Range Status  08/02/2018 04:38 AM 5.6 4.8 - 5.6 % Final    Comment:    (NOTE) Pre diabetes:          5.7%-6.4% Diabetes:              >6.4% Glycemic control for   <7.0% adults with diabetes     CBG: Recent Labs  Lab 08/06/18 1948 08/21/2018 0031 08/21/2018 0333 21-Aug-2018 0844 08-21-18 0845  GLUCAP 141* 136* 154* 20* 74     Critical care time: 35 minutes    Roselie Awkward, MD [place] [facility] Pager: [phone] Cell: [phone] If no response, call [phone]

## 2018-08-21 DEATH — deceased

## 2019-07-30 IMAGING — CT CT HEAD W/O CM
4 series · 15 of 47 positions shown, 17 images · non-contrast
Comparison: None.

CLINICAL DATA: Stroke follow-up

EXAM:
CT HEAD WITHOUT CONTRAST
TECHNIQUE: Contiguous axial images were obtained from the base of the skull
through the vertex without intravenous contrast.

[Series 4: head wo · axial · 0.42mm/px · z∈[-136,-16]mm · 7 of 34 slices shown, 9 images]
[im 5/34  brain]
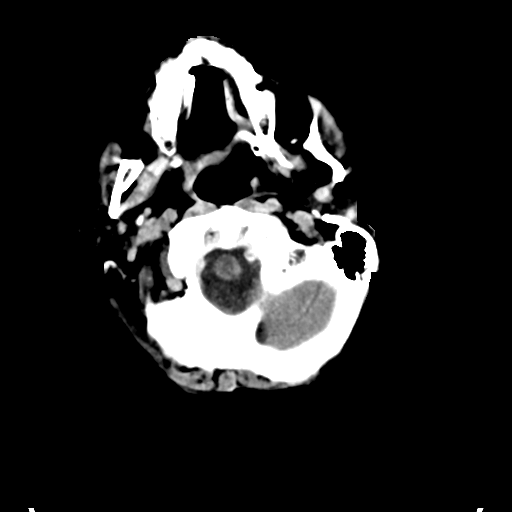
[im 5/34  bone]
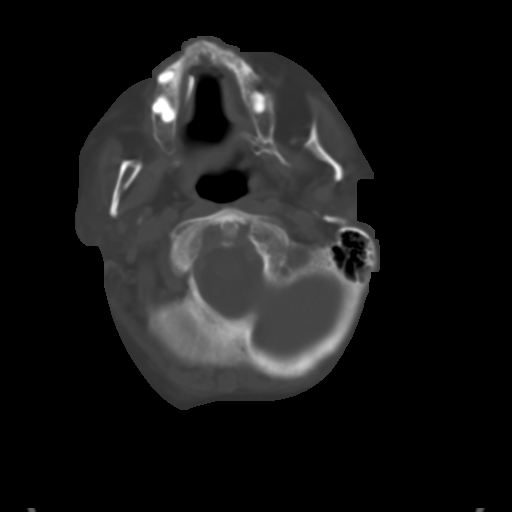
[im 9/34  brain]
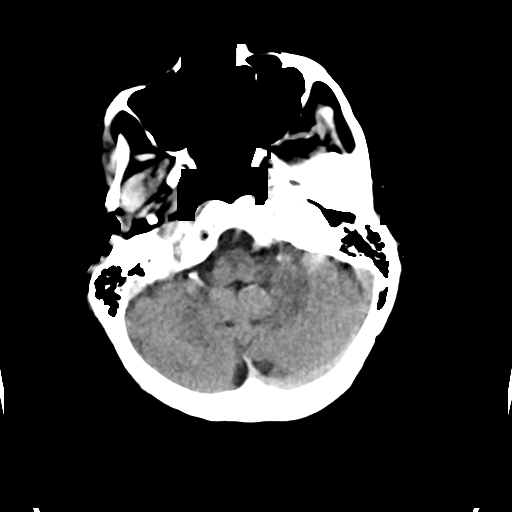
[im 13/34  brain]
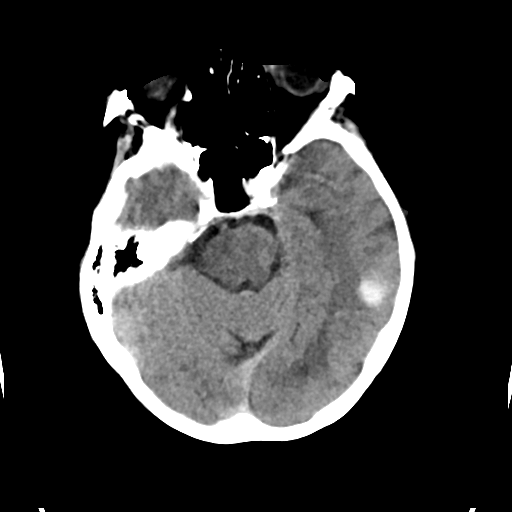
[im 17/34  brain]
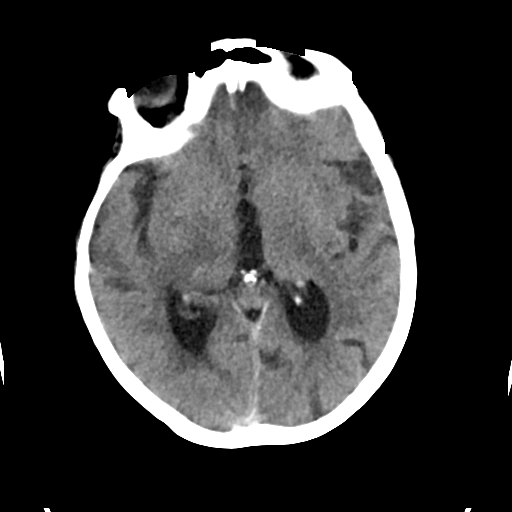
[im 21/34  brain]
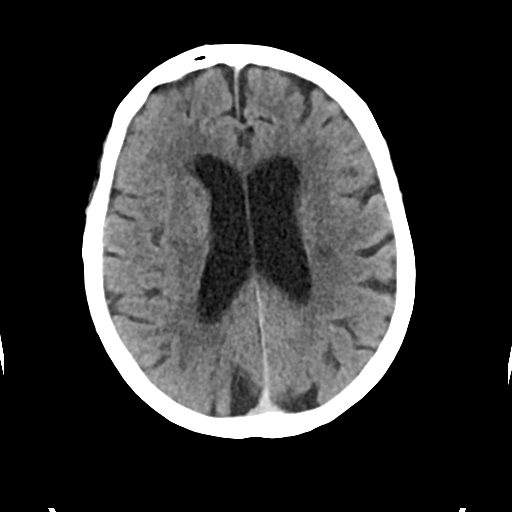
[im 21/34  bone]
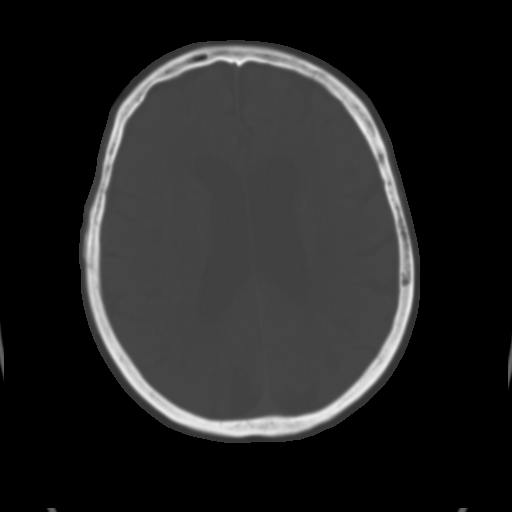
[im 25/34  brain]
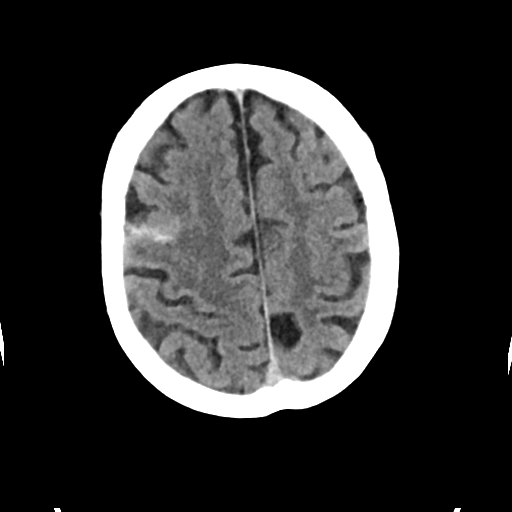
[im 29/34  brain]
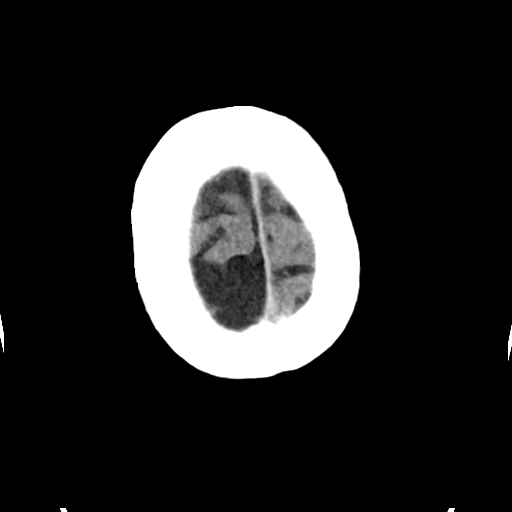

[Series 5: head bone · axial · 0.42mm/px · z∈[-140,-124]mm · 2 of 85 slices shown]
[im 9/85  bone]
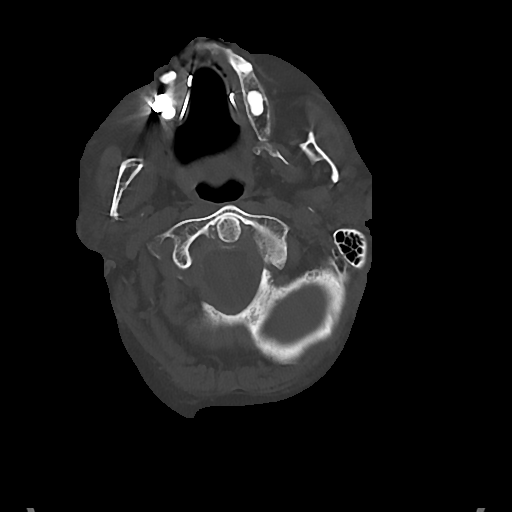
[im 17/85  bone]
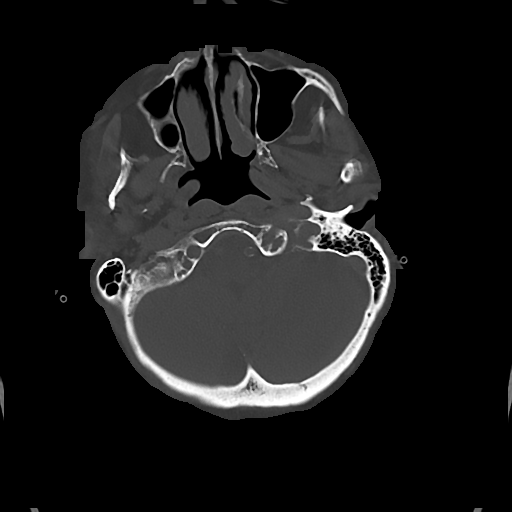

[Series 6: cor soft · coronal · 0.33mm/px · 3 of 74 slices shown]
[im 25/74  brain]
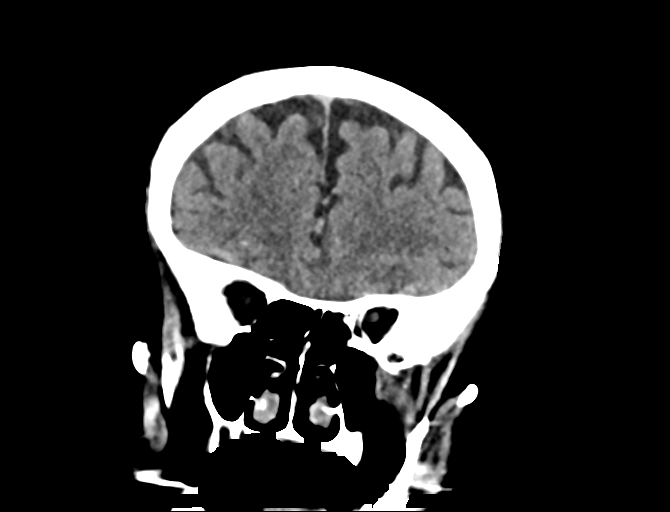
[im 33/74  brain]
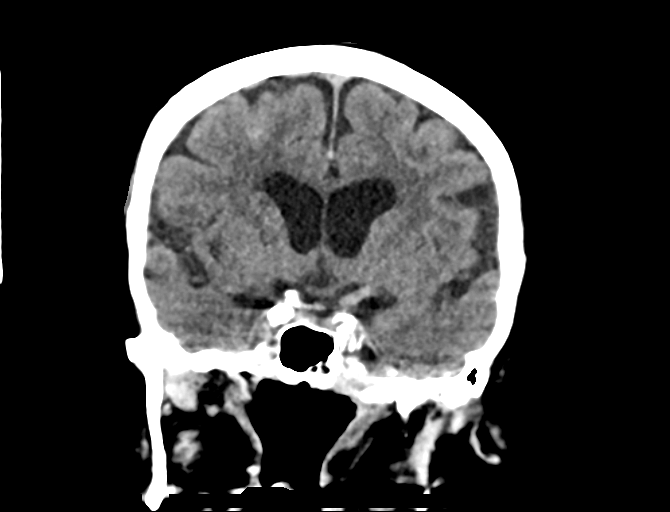
[im 41/74  brain]
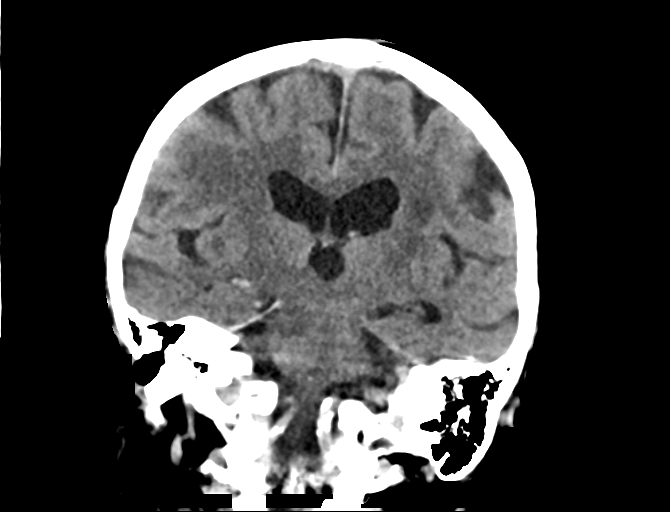

[Series 7: sag soft · sagittal · 0.33mm/px · 3 of 67 slices shown]
[im 23/67  brain]
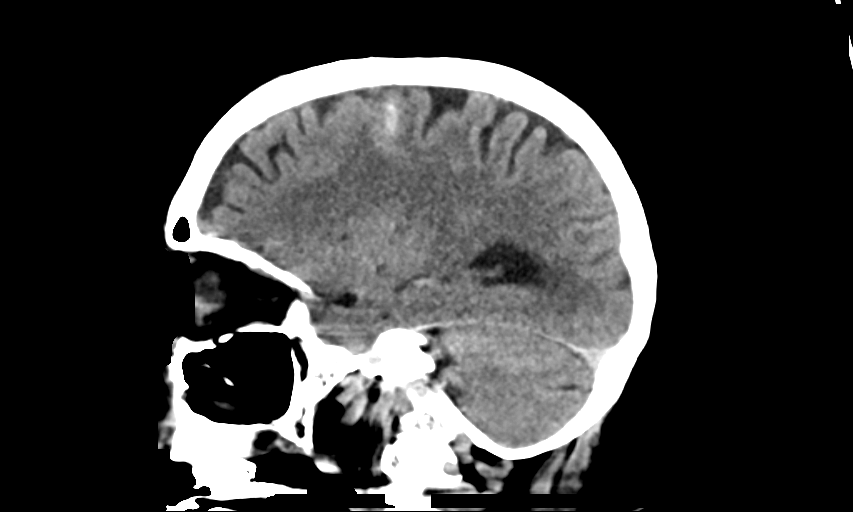
[im 34/67  brain]
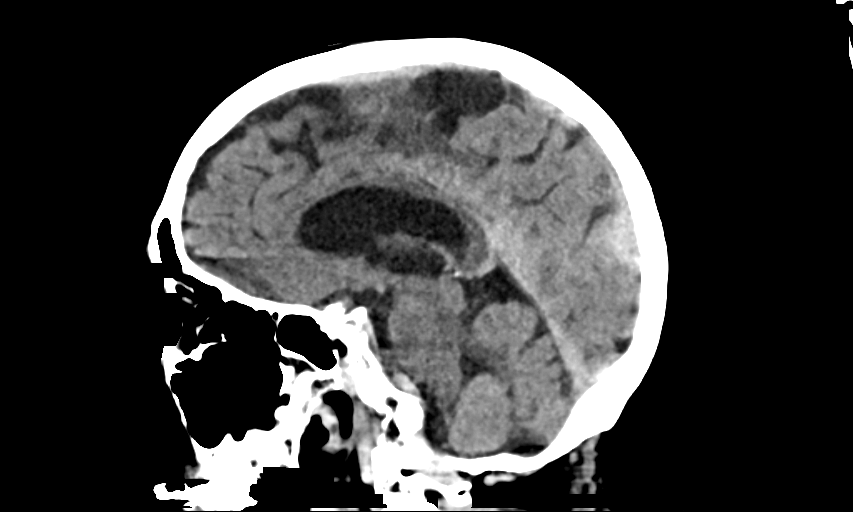
[im 45/67  brain]
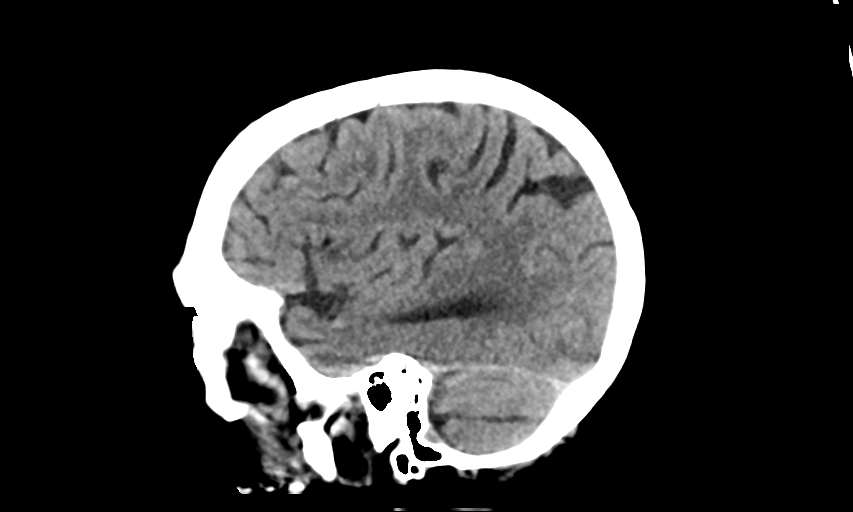

[15 of 47 positions shown; findings below may reference images not displayed]

FINDINGS: Brain: There is subarachnoid hemorrhage overlying the right frontal
lobe and inferior left temporal lobe. No associated midline shift or
other mass effect. Size and configuration of the ventricles are
unchanged.

Vascular: Atherosclerotic calcification of the vertebral and
internal carotid arteries at the skull base. No abnormal
hyperdensity of the major intracranial arteries or dural venous
sinuses.

Skull: The visualized skull base, calvarium and extracranial soft
tissues are normal.

Sinuses/Orbits: No fluid levels or advanced mucosal thickening of
the visualized paranasal sinuses. No mastoid or middle ear effusion.
The orbits are normal.
IMPRESSION: Acute subarachnoid hemorrhage overlying the right frontal lobe and
inferior left temporal lobe. No midline shift or other mass effect.

Critical Value/emergent results were called by telephone at the time
of interpretation on 08/01/2018 at [DATE] to Dr. JUMPER
ZHENYA , who verbally acknowledged these results.

## 2019-08-03 IMAGING — DX DG CHEST 1V PORT
1 series · 1 of 1 positions shown · non-contrast
Comparison: Portable chest x-ray August 03, 2018

CLINICAL DATA: Respiratory failure, intubated patient, sepsis,
hypotension

EXAM:
PORTABLE CHEST 1 VIEW

[chest]
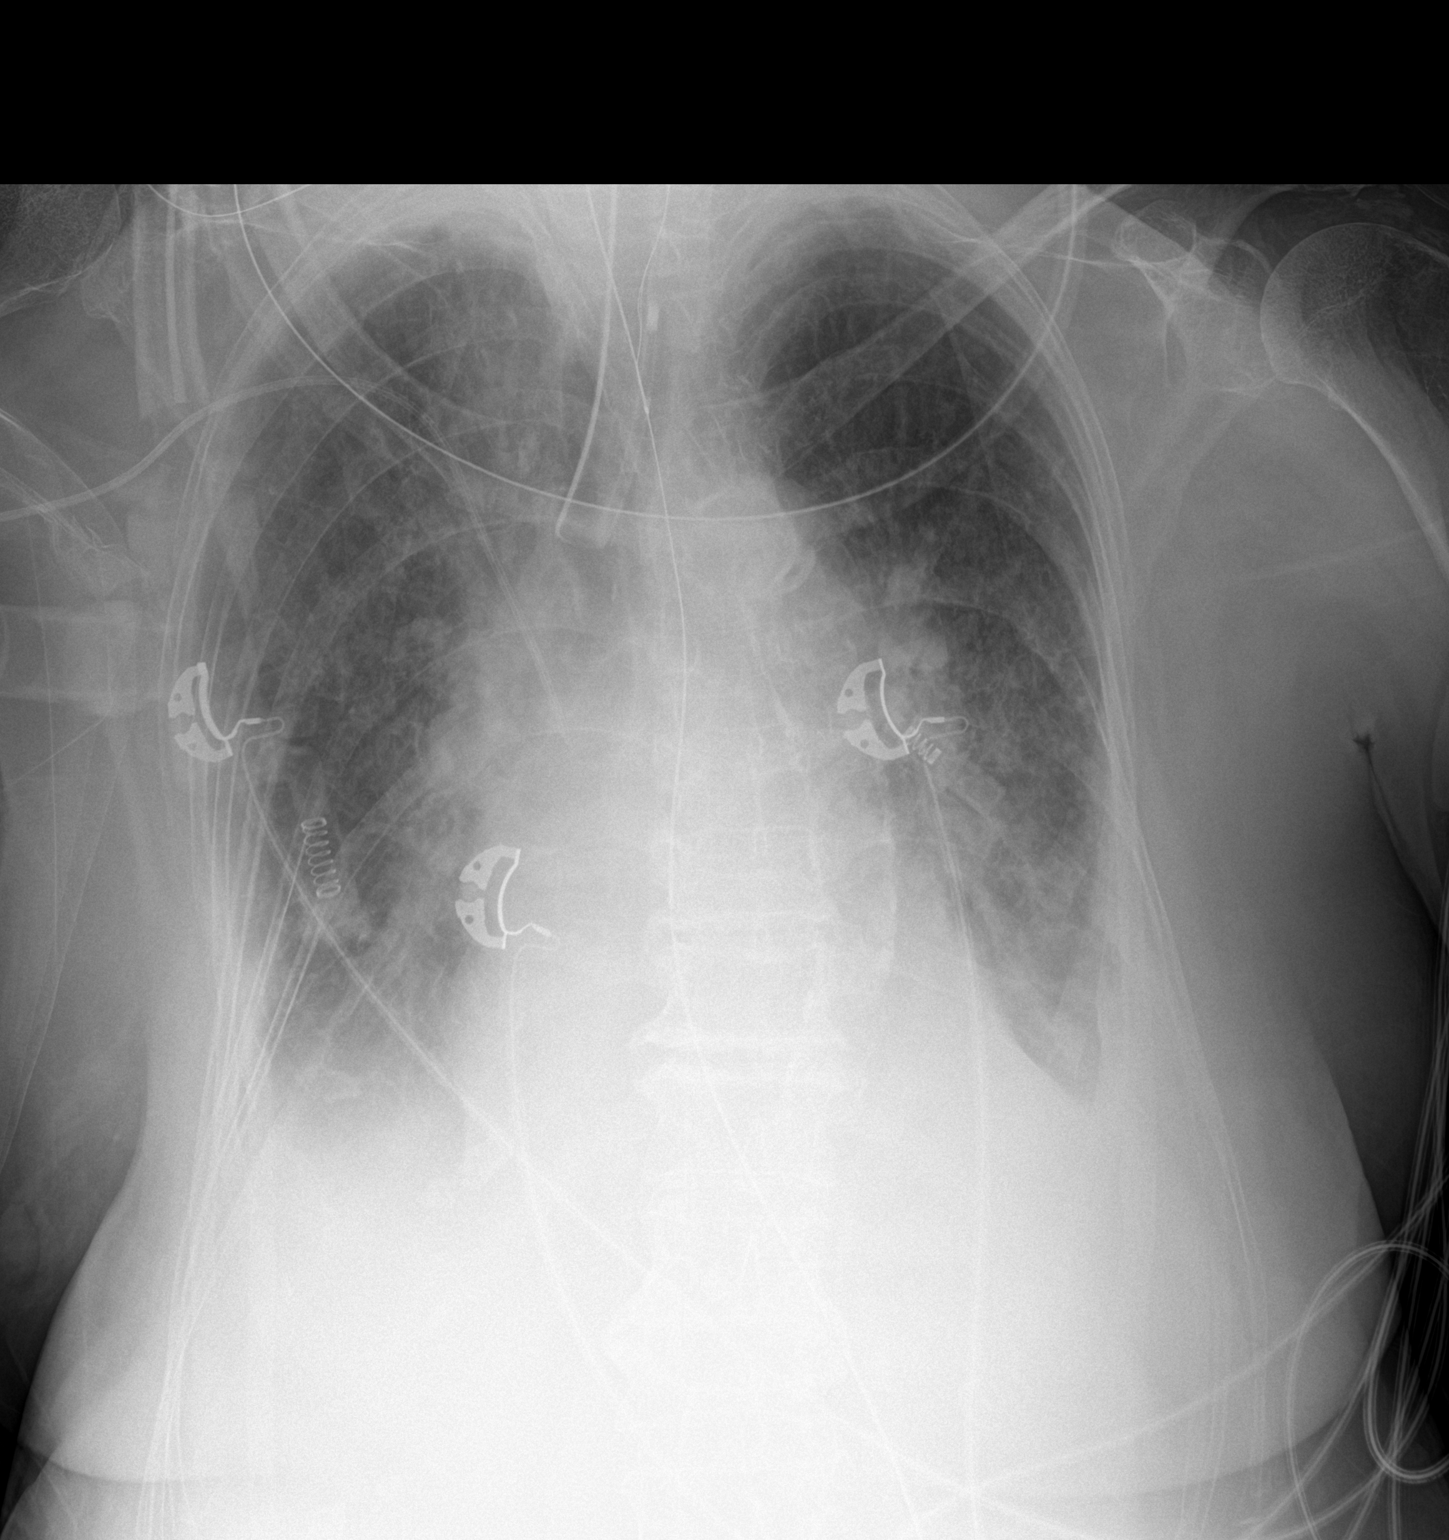

[1 of 1 positions shown; findings below may reference images not displayed]

FINDINGS: The lungs remain hyperinflated. The interstitial markings remain
increased but have improved somewhat. There small bilateral pleural
effusions. The cardiac silhouette is enlarged. The pulmonary
vascularity is engorged and indistinct. There is no pneumothorax.
The endotracheal tube tip lies 2.1 cm above the carina. The
esophagogastric tube tip and proximal port project below the GE
junction. The right PICC line tip projects over the midportion of
the SVC.
IMPRESSION: CHF superimposed upon COPD. Slight interval decrease in pulmonary
interstitial edema. Small bilateral pleural effusions.

The support tubes are in reasonable position.

## 2019-08-04 IMAGING — DX DG CHEST 1V PORT
1 series · 1 of 1 positions shown · non-contrast
Comparison: 08/05/2018

CLINICAL DATA: Acute respiratory failure and fevers

EXAM:
PORTABLE CHEST 1 VIEW

[chest]
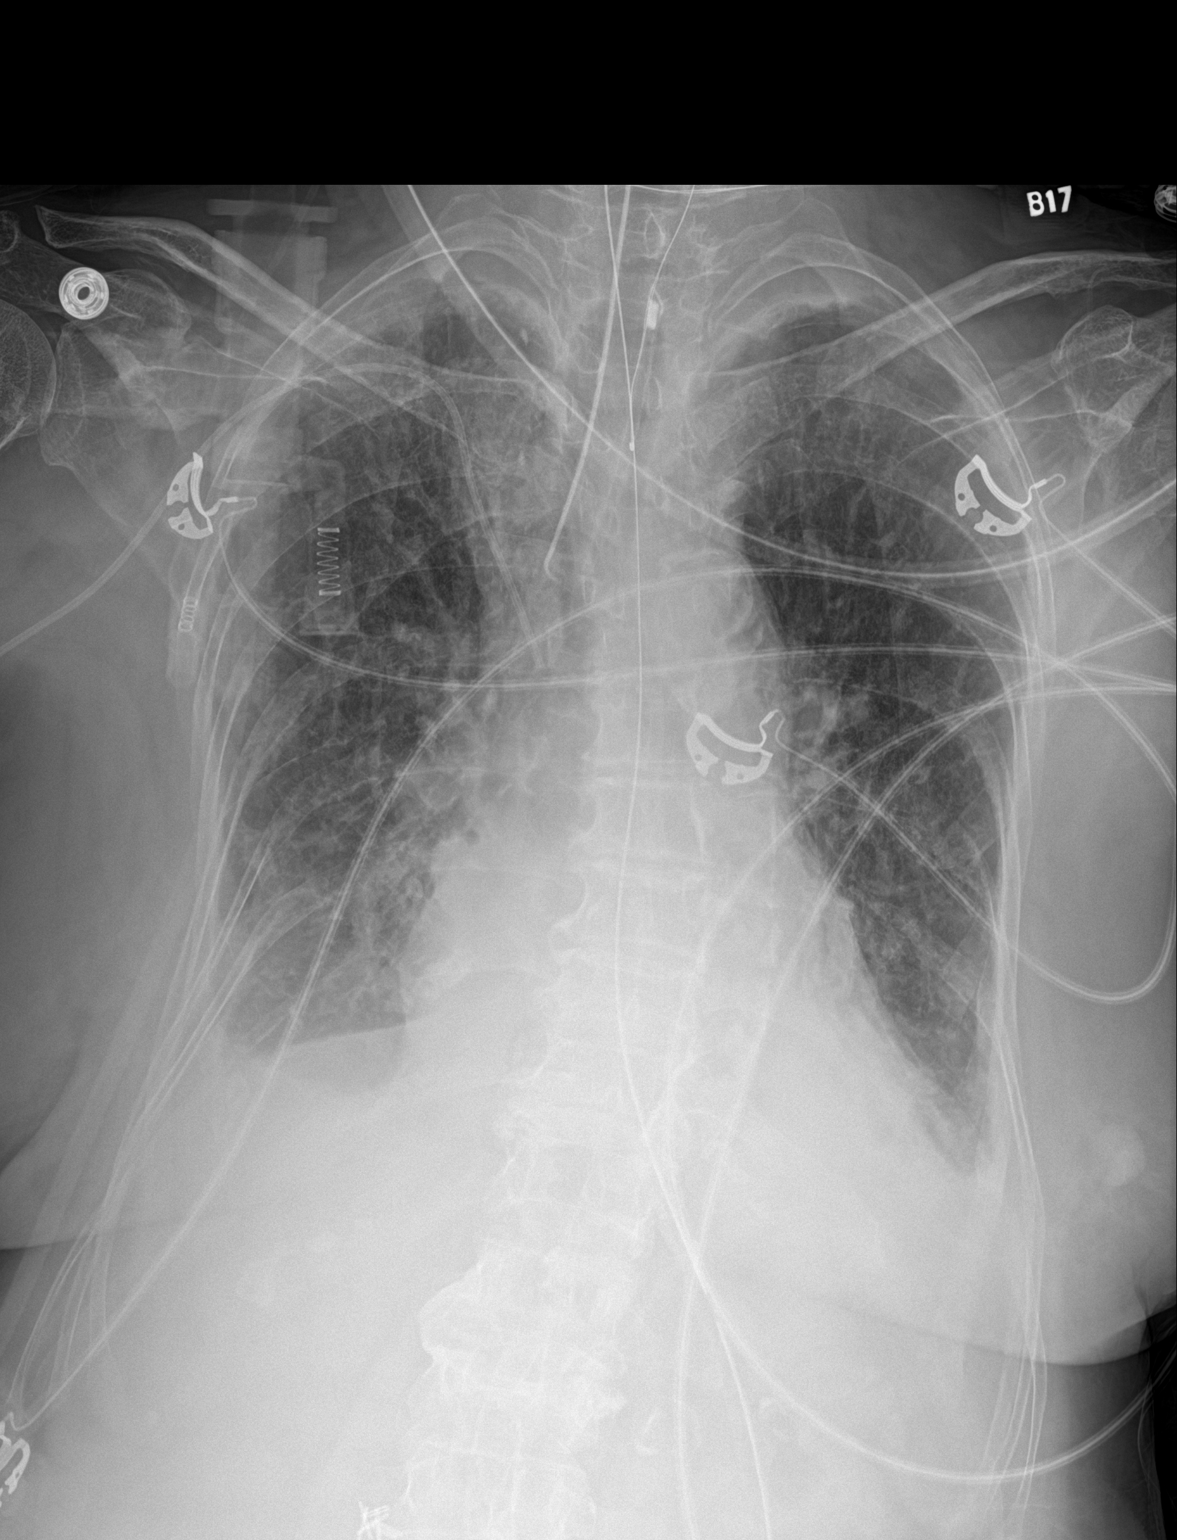

[1 of 1 positions shown; findings below may reference images not displayed]

FINDINGS: Cardiac shadow is stable. Endotracheal tube and nasogastric catheter
are again seen and stable. Right-sided central venous line is noted
in the mid superior vena cava. Small effusions are again noted
bilaterally and stable. Mild left retrocardiac atelectasis is noted
better visualized on the current exam. No pneumothorax is seen.
Biapical scarring is noted. Mild interstitial changes are again
seen.
IMPRESSION: Bilateral pleural effusions.

Tubes and lines as described.
